# Patient Record
Sex: Female | Born: 1955 | Race: White | Hispanic: No | Marital: Married | Smoking: Current every day smoker
Health system: Southern US, Community
[De-identification: ages and names within clinical notes are randomized; demographics above are authoritative.]

## PROBLEM LIST (undated history)

## (undated) DIAGNOSIS — IMO0002 Reserved for concepts with insufficient information to code with codable children: Secondary | ICD-10-CM

## (undated) DIAGNOSIS — K589 Irritable bowel syndrome without diarrhea: Secondary | ICD-10-CM

## (undated) DIAGNOSIS — J329 Chronic sinusitis, unspecified: Secondary | ICD-10-CM

## (undated) DIAGNOSIS — T7840XA Allergy, unspecified, initial encounter: Secondary | ICD-10-CM

## (undated) DIAGNOSIS — F329 Major depressive disorder, single episode, unspecified: Secondary | ICD-10-CM

## (undated) DIAGNOSIS — K219 Gastro-esophageal reflux disease without esophagitis: Secondary | ICD-10-CM

## (undated) DIAGNOSIS — R601 Generalized edema: Secondary | ICD-10-CM

## (undated) DIAGNOSIS — F32A Depression, unspecified: Secondary | ICD-10-CM

## (undated) DIAGNOSIS — I10 Essential (primary) hypertension: Secondary | ICD-10-CM

## (undated) DIAGNOSIS — F419 Anxiety disorder, unspecified: Secondary | ICD-10-CM

## (undated) DIAGNOSIS — M549 Dorsalgia, unspecified: Secondary | ICD-10-CM

## (undated) DIAGNOSIS — G8929 Other chronic pain: Secondary | ICD-10-CM

## (undated) DIAGNOSIS — G4700 Insomnia, unspecified: Secondary | ICD-10-CM

## (undated) HISTORY — DX: Irritable bowel syndrome, unspecified: K58.9

## (undated) HISTORY — PX: EYE SURGERY: SHX253

## (undated) HISTORY — DX: Anxiety disorder, unspecified: F41.9

## (undated) HISTORY — PX: FRACTURE SURGERY: SHX138

## (undated) HISTORY — PX: COLONOSCOPY WITH ESOPHAGOGASTRODUODENOSCOPY (EGD): SHX5779

## (undated) HISTORY — PX: ABDOMINAL HYSTERECTOMY: SHX81

## (undated) HISTORY — DX: Gastro-esophageal reflux disease without esophagitis: K21.9

## (undated) HISTORY — DX: Chronic sinusitis, unspecified: J32.9

## (undated) HISTORY — DX: Major depressive disorder, single episode, unspecified: F32.9

## (undated) HISTORY — DX: Insomnia, unspecified: G47.00

## (undated) HISTORY — DX: Other chronic pain: G89.29

## (undated) HISTORY — DX: Dorsalgia, unspecified: M54.9

## (undated) HISTORY — DX: Generalized edema: R60.1

## (undated) HISTORY — PX: APPENDECTOMY: SHX54

## (undated) HISTORY — DX: Depression, unspecified: F32.A

## (undated) HISTORY — PX: DILATION AND CURETTAGE OF UTERUS: SHX78

## (undated) HISTORY — DX: Essential (primary) hypertension: I10

## (undated) HISTORY — PX: BREAST BIOPSY: SHX20

## (undated) HISTORY — DX: Allergy, unspecified, initial encounter: T78.40XA

## (undated) HISTORY — DX: Reserved for concepts with insufficient information to code with codable children: IMO0002

---

## 2004-10-07 ENCOUNTER — Ambulatory Visit: Payer: Self-pay

## 2004-10-19 ENCOUNTER — Ambulatory Visit: Payer: Self-pay

## 2004-12-02 ENCOUNTER — Ambulatory Visit: Payer: Self-pay | Admitting: Nurse Practitioner

## 2005-04-14 ENCOUNTER — Ambulatory Visit: Payer: Self-pay

## 2006-04-13 ENCOUNTER — Ambulatory Visit: Payer: Self-pay

## 2009-03-12 ENCOUNTER — Ambulatory Visit: Payer: Self-pay

## 2009-03-24 ENCOUNTER — Ambulatory Visit: Payer: Self-pay

## 2009-10-22 ENCOUNTER — Ambulatory Visit: Payer: Self-pay

## 2011-09-22 ENCOUNTER — Ambulatory Visit: Payer: Self-pay | Admitting: Family Medicine

## 2013-12-18 ENCOUNTER — Ambulatory Visit: Payer: Self-pay | Admitting: Pain Medicine

## 2013-12-24 ENCOUNTER — Ambulatory Visit: Payer: Self-pay | Admitting: Pain Medicine

## 2014-01-07 ENCOUNTER — Ambulatory Visit: Payer: Self-pay | Admitting: Pain Medicine

## 2014-01-08 ENCOUNTER — Ambulatory Visit: Payer: Self-pay | Admitting: Pain Medicine

## 2014-01-09 ENCOUNTER — Ambulatory Visit: Payer: Self-pay | Admitting: Pain Medicine

## 2014-02-05 ENCOUNTER — Ambulatory Visit: Payer: Self-pay | Admitting: Pain Medicine

## 2014-02-27 ENCOUNTER — Ambulatory Visit: Payer: Self-pay | Admitting: Pain Medicine

## 2014-04-02 ENCOUNTER — Ambulatory Visit: Admit: 2014-04-02 | Disposition: A | Discharge status: Home / Self Care | Payer: Self-pay | Admitting: Family Medicine

## 2014-04-02 ENCOUNTER — Ambulatory Visit: Payer: Self-pay | Admitting: Pain Medicine

## 2014-05-20 ENCOUNTER — Ambulatory Visit: Payer: Self-pay | Admitting: Pain Medicine

## 2014-06-18 ENCOUNTER — Ambulatory Visit: Payer: Self-pay | Admitting: Pain Medicine

## 2014-07-22 ENCOUNTER — Ambulatory Visit: Payer: Self-pay | Admitting: Pain Medicine

## 2014-08-28 ENCOUNTER — Ambulatory Visit
Admit: 2014-08-28 | Disposition: A | Discharge status: Home / Self Care | Payer: Self-pay | Attending: Pain Medicine | Admitting: Pain Medicine

## 2014-09-11 ENCOUNTER — Ambulatory Visit
Admit: 2014-09-11 | Disposition: A | Discharge status: Home / Self Care | Payer: Self-pay | Attending: Pain Medicine | Admitting: Pain Medicine

## 2014-10-15 ENCOUNTER — Encounter: Payer: Self-pay | Admitting: Pain Medicine

## 2014-10-15 ENCOUNTER — Ambulatory Visit: Payer: BC Managed Care – PPO | Attending: Pain Medicine | Admitting: Pain Medicine

## 2014-10-15 VITALS — BP 126/82 | HR 100 | Temp 98.1°F | Resp 18 | Ht 66.0 in | Wt 180.0 lb

## 2014-10-15 DIAGNOSIS — M533 Sacrococcygeal disorders, not elsewhere classified: Secondary | ICD-10-CM

## 2014-10-15 DIAGNOSIS — M461 Sacroiliitis, not elsewhere classified: Secondary | ICD-10-CM | POA: Diagnosis not present

## 2014-10-15 DIAGNOSIS — M5136 Other intervertebral disc degeneration, lumbar region: Secondary | ICD-10-CM | POA: Diagnosis not present

## 2014-10-15 DIAGNOSIS — M1288 Other specific arthropathies, not elsewhere classified, other specified site: Secondary | ICD-10-CM | POA: Insufficient documentation

## 2014-10-15 DIAGNOSIS — M5126 Other intervertebral disc displacement, lumbar region: Secondary | ICD-10-CM | POA: Diagnosis not present

## 2014-10-15 DIAGNOSIS — M47816 Spondylosis without myelopathy or radiculopathy, lumbar region: Secondary | ICD-10-CM

## 2014-10-15 DIAGNOSIS — M545 Low back pain: Secondary | ICD-10-CM | POA: Diagnosis present

## 2014-10-15 MED ORDER — HYDROCODONE-ACETAMINOPHEN 5-325 MG PO TABS: ORAL_TABLET | ORAL | Status: DC

## 2014-10-15 MED ORDER — CYCLOBENZAPRINE HCL 10 MG PO TABS: ORAL_TABLET | ORAL | Status: DC

## 2014-10-15 NOTE — Progress Notes (Signed)
   Subjective:    Patient ID: Katherine Lester, female    DOB: 1956-04-16, 59 y.o.   MRN: [id number]  HPI  Patient is a 59 year old female returns to [place] for further evaluation and treatment of pain involving the lumbar lower extremity region. Patient has had significant improvement with prior lumbar facet, medial branch nerve blocks and wishes to proceed with lumbar facet medial branch nerve blocks at time return appointment. He denies trauma change in events of daily living the call significant change in symptomatology. We will proceed with procedure in attempt to decrease severity of symptoms minimize progression of patient's symptoms and hopefully avoid the need for more involved treatment. We will continue medications as prescribed  Review of Systems     Objective:   Physical Exam  There was tenderness over the splenius capitis muscles and the occipitalis muscles of mild degree with mild tenderness of the acromioclavicular and glenohumeral joint regions. This of the thoracic and cervical paraspinal musculature regions. Tinel and Phalen's maneuver without increased pain of significant degree. Patient of the lumbar facet lumbar paraspinal musculature region musculature region reproduced severe pain on the left compared to the right. With severe tenderness to palpation of the PSIS and PII S region as well outpatient of the lumbar facets on the left reproduce most severe component of patient's pain. Leg raising tolerates approximately 30 without an increase of pain with dorsiflexion noted. DTRs appear to be 1+ at the knees. Tenderness of the greater trochanteric region iliotibial band region. Negative clonus negative Homans abdomen nontender no costovertebral angle tenderness noted.      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  Facet syndrome  Sacroiliitis, sacroiliac joint dysfunction    Plan  Continue present medications. Flexeril and hydrocodone acetaminophen  Will perform lumbar facet, medial branch nerve, blocks at time return appointment  F/U PCP for evaliation of  BP and general medical  condition.  F/U surgical evaluation.  F/U neurological evaluation.  May consider radiofrequency rhizolysis or intraspinal procedures pending response to present treatment and F/U evaluation.  Patient to call [facility] should patient have concerns prior to scheduled return appointment.

## 2014-10-15 NOTE — Progress Notes (Signed)
   Subjective:    Patient ID: Katherine Lester, female    DOB: Nov 04, 1955, 59 y.o.   MRN: [id number]  HPI    Review of Systems     Objective:   Physical Exam        Assessment & Plan:

## 2014-10-15 NOTE — Patient Instructions (Addendum)
Continue present medications. Flexeril and hydrocodone acetaminophen  Lumbar facet, medial branch nerve blocks on 11/11/2014  F/U PCP for evaliation of  BP and general medical  condition.  F/U surgical evaluation.  F/U neurological evaluation.  May consider radiofrequency rhizolysis or intraspinal procedures pending response to present treatment and F/U evaluation.  Patient to call Pain Management Center should patient have concerns prior to scheduled return appointment. Facet Joint Block The facet joints connect the bones of the spine (vertebrae). They make it possible for you to bend, twist, and make other movements with your spine. They also prevent you from overbending, overtwisting, and making other excessive movements.  A facet joint block is a procedure where a numbing medicine (anesthetic) is injected into a facet joint. Often, a type of anti-inflammatory medicine called a steroid is also injected. A facet joint block may be done for two reasons:  1. Diagnosis. A facet joint block may be done as a test to see whether neck or back pain is caused by a worn-down or infected facet joint. If the pain gets better after a facet joint block, it means the pain is probably coming from the facet joint. If the pain does not get better, it means the pain is probably not coming from the facet joint.  2. Therapy. A facet joint block may be done to relieve neck or back pain caused by a facet joint. A facet joint block is only done as a therapy if the pain does not improve with medicine, exercise programs, physical therapy, and other forms of pain management. LET [facility] CARE PROVIDER KNOW ABOUT:   Any allergies you have.   All medicines you are taking, including vitamins, herbs, eyedrops, and over-the-counter medicines and creams.   Previous problems you or members of your family have had with the use of anesthetics.   Any blood disorders you have had.   Other health problems you  have. RISKS AND COMPLICATIONS Generally, having a facet joint block is safe. However, as with any procedure, complications can occur. Possible complications associated with having a facet joint block include:   Bleeding.   Injury to a nerve near the injection site.   Pain at the injection site.   Weakness or numbness in areas controlled by nerves near the injection site.   Infection.   Temporary fluid retention.   Allergic reaction to anesthetics or medicines used during the procedure. BEFORE THE PROCEDURE   Follow your health care provider's instructions if you are taking dietary supplements or medicines. You may need to stop taking them or reduce your dosage.   Do not take any new dietary supplements or medicines without asking your health care provider first.   Follow your health care provider's instructions about eating and drinking before the procedure. You may need to stop eating and drinking several hours before the procedure.   Arrange to have an adult drive you home after the procedure. PROCEDURE 1. You may need to remove your clothing and dress in an open-back gown so that your health care provider can access your spine.  2. The procedure will be done while you are lying on an X-ray table. Most of the time you will be asked to lie on your stomach, but you may be asked to lie in a different position if an injection will be made in your neck.  3. Special machines will be used to monitor your oxygen levels, heart rate, and blood pressure.  4. If an injection will be  made in your neck, an intravenous (IV) tube will be inserted into one of your veins. Fluids and medicine will flow directly into your body through the IV tube.  5. The area over the facet joint where the injection will be made will be cleaned with an antiseptic soap. The surrounding skin will be covered with sterile drapes.  6. An anesthetic will be applied to your skin to make the injection area numb.  You may feel a temporary stinging or burning sensation.  7. A video X-ray machine will be used to locate the joint. A contrast dye may be injected into the facet joint area to help with locating the joint.  8. When the joint is located, an anesthetic medicine will be injected into the joint through the needle.  9. Your health care provider will ask you whether you feel pain relief. If you do feel relief, a steroid may be injected to provide pain relief for a longer period of time. If you do not feel relief or feel only partial relief, additional injections of an anesthetic may be made in other facet joints.  10. The needle will be removed, the skin will be cleansed, and bandages will be applied.  AFTER THE PROCEDURE   You will be observed for 15-30 minutes before being allowed to go home. Do not drive. Have an adult drive you or take a taxi or public transportation instead.   If you feel pain relief, the pain will return in several hours or days when the anesthetic wears off.   You may feel pain relief 2-14 days after the procedure. The amount of time this relief lasts varies from person to person.   It is normal to feel some tenderness over the injected area(s) for 2 days following the procedure.   If you have diabetes, you may have a temporary increase in blood sugar. Document Released: 10/06/2006 Document Revised: 10/01/2013 Document Reviewed: 03/06/2012 [facility] Patient Information 2015 [place], [place]. This information is not intended to replace advice given to you by your health care provider. Make sure you discuss any questions you have with your health care provider. Pain Management Discharge Instructions  General Discharge Instructions :  If you need to reach your doctor call: Monday-Friday 8:00 am - 4:00 pm at [phone] or toll free [phone].  After clinic hours [phone] to have operator reach doctor.  Bring all of your medication bottles to all your  appointments in the pain clinic.  To cancel or reschedule your appointment with Pain Management please remember to call 24 hours in advance to avoid a fee.  Refer to the educational materials which you have been given on: General Risks, I had my Procedure. Discharge Instructions, Post Sedation.  Post Procedure Instructions:  The drugs you were given will stay in your system until tomorrow, so for the next 24 hours you should not drive, make any legal decisions or drink any alcoholic beverages.  You may eat anything you prefer, but it is better to start with liquids then soups and crackers, and gradually work up to solid foods.  Please notify your doctor immediately if you have any unusual bleeding, trouble breathing or pain that is not related to your normal pain.  Depending on the type of procedure that was done, some parts of your body may feel week and/or numb.  This usually clears up by tonight or the next day.  Walk with the use of an assistive device or accompanied by an adult for the 24  hours.  You may use ice on the affected area for the first 24 hours.  Put ice in a Ziploc bag and cover with a towel and place against area 15 minutes on 15 minutes off.  You may switch to heat after 24 hours.GENERAL RISKS AND COMPLICATIONS  What are the risk, side effects and possible complications? Generally speaking, most procedures are safe.  However, with any procedure there are risks, side effects, and the possibility of complications.  The risks and complications are dependent upon the sites that are lesioned, or the type of nerve block to be performed.  The closer the procedure is to the spine, the more serious the risks are.  Great care is taken when placing the radio frequency needles, block needles or lesioning probes, but sometimes complications can occur. 1. Infection: Any time there is an injection through the skin, there is a risk of infection.  This is why sterile conditions are used for  these blocks.  There are four possible types of infection. 1. Localized skin infection. 2. Central Nervous System Infection-This can be in the form of Meningitis, which can be deadly. 3. Epidural Infections-This can be in the form of an epidural abscess, which can cause pressure inside of the spine, causing compression of the spinal cord with subsequent paralysis. This would require an emergency surgery to decompress, and there are no guarantees that the patient would recover from the paralysis. 4. Discitis-This is an infection of the intervertebral discs.  It occurs in about 1% of discography procedures.  It is difficult to treat and it may lead to surgery.        2. Pain: the needles have to go through skin and soft tissues, will cause soreness.       3. Damage to internal structures:  The nerves to be lesioned may be near blood vessels or    other nerves which can be potentially damaged.       4. Bleeding: Bleeding is more common if the patient is taking blood thinners such as  aspirin, Coumadin, Ticiid, Plavix, etc., or if he/she have some genetic predisposition  such as hemophilia. Bleeding into the spinal canal can cause compression of the spinal  cord with subsequent paralysis.  This would require an emergency surgery to  decompress and there are no guarantees that the patient would recover from the  paralysis.       5. Pneumothorax:  Puncturing of a lung is a possibility, every time a needle is introduced in  the area of the chest or upper back.  Pneumothorax refers to free air around the  collapsed lung(s), inside of the thoracic cavity (chest cavity).  Another two possible  complications related to a similar event would include: Hemothorax and Chylothorax.   These are variations of the Pneumothorax, where instead of air around the collapsed  lung(s), you may have blood or chyle, respectively.       6. Spinal headaches: They may occur with any procedures in the area of the spine.        7. Persistent CSF (Cerebro-Spinal Fluid) leakage: This is a rare problem, but may occur  with prolonged intrathecal or epidural catheters either due to the formation of a fistulous  track or a dural tear.       8. Nerve damage: By working so close to the spinal cord, there is always a possibility of  nerve damage, which could be as serious as a permanent spinal cord injury with  paralysis.       9. Death:  Although rare, severe deadly allergic reactions known as "Anaphylactic  reaction" can occur to any of the medications used.      10. Worsening of the symptoms:  We can always make thing worse.  What are the chances of something like this happening? Chances of any of this occuring are extremely low.  By statistics, you have more of a chance of getting killed in a motor vehicle accident: while driving to the hospital than any of the above occurring .  Nevertheless, you should be aware that they are possibilities.  In general, it is similar to taking a shower.  Everybody knows that you can slip, hit your head and get killed.  Does that mean that you should not shower again?  Nevertheless always keep in mind that statistics do not mean anything if you happen to be on the wrong side of them.  Even if a procedure has a 1 (one) in a 1,000,000 (million) chance of going wrong, it you happen to be that one..Also, keep in mind that by statistics, you have more of a chance of having something go wrong when taking medications.  Who should not have this procedure? If you are on a blood thinning medication (e.g. Coumadin, Plavix, see list of "Blood Thinners"), or if you have an active infection going on, you should not have the procedure.  If you are taking any blood thinners, please inform your physician.  How should I prepare for this procedure?  Do not eat or drink anything at least six hours prior to the procedure.  Bring a driver with you .  It cannot be a taxi.  Come accompanied by an adult that can drive  you back, and that is strong enough to help you if your legs get weak or numb from the local anesthetic.  Take all of your medicines the morning of the procedure with just enough water to swallow them.  If you have diabetes, make sure that you are scheduled to have your procedure done first thing in the morning, whenever possible.  If you have diabetes, take only half of your insulin dose and notify our nurse that you have done so as soon as you arrive at the clinic.  If you are diabetic, but only take blood sugar pills (oral hypoglycemic), then do not take them on the morning of your procedure.  You may take them after you have had the procedure.  Do not take aspirin or any aspirin-containing medications, at least eleven (11) days prior to the procedure.  They may prolong bleeding.  Wear loose fitting clothing that may be easy to take off and that you would not mind if it got stained with Betadine or blood.  Do not wear any jewelry or perfume  Remove any nail coloring.  It will interfere with some of our monitoring equipment.  NOTE: Remember that this is not meant to be interpreted as a complete list of all possible complications.  Unforeseen problems may occur.  BLOOD THINNERS The following drugs contain aspirin or other products, which can cause increased bleeding during surgery and should not be taken for 2 weeks prior to and 1 week after surgery.  If you should need take something for relief of minor pain, you may take acetaminophen which is found in Tylenol,m Datril, Anacin-3 and Panadol. It is not blood thinner. The products listed below are.  Do not take any of the products listed below  in addition to any listed on your instruction sheet.  A.P.C or A.P.C with Codeine Codeine Phosphate Capsules #3 Ibuprofen Ridaura  ABC compound Congesprin Imuran rimadil  Advil Cope Indocin Robaxisal  Alka-Seltzer Effervescent Pain Reliever and Antacid Coricidin or Coricidin-D  Indomethacin Rufen   Alka-Seltzer plus Cold Medicine Cosprin Ketoprofen S-A-C Tablets  Anacin Analgesic Tablets or Capsules Coumadin Korlgesic Salflex  Anacin Extra Strength Analgesic tablets or capsules CP-2 Tablets Lanoril Salicylate  Anaprox Cuprimine Capsules Levenox Salocol  Anexsia-D Dalteparin Magan Salsalate  Anodynos Darvon compound Magnesium Salicylate Sine-off  Ansaid Dasin Capsules Magsal Sodium Salicylate  Anturane Depen Capsules Marnal Soma  APF Arthritis pain formula Dewitt's Pills Measurin Stanback  Argesic Dia-Gesic Meclofenamic Sulfinpyrazone  Arthritis Bayer Timed Release Aspirin Diclofenac Meclomen Sulindac  Arthritis pain formula Anacin Dicumarol Medipren Supac  Analgesic (Safety coated) Arthralgen Diffunasal Mefanamic Suprofen  Arthritis Strength Bufferin Dihydrocodeine Mepro Compound Suprol  Arthropan liquid Dopirydamole Methcarbomol with Aspirin Synalgos  ASA tablets/Enseals Disalcid Micrainin Tagament  Ascriptin Doan's Midol Talwin  Ascriptin A/D Dolene Mobidin Tanderil  Ascriptin Extra Strength Dolobid Moblgesic Ticlid  Ascriptin with Codeine Doloprin or Doloprin with Codeine Momentum Tolectin  Asperbuf Duoprin Mono-gesic Trendar  Aspergum Duradyne Motrin or Motrin IB Triminicin  Aspirin plain, buffered or enteric coated Durasal Myochrisine Trigesic  Aspirin Suppositories Easprin Nalfon Trillsate  Aspirin with Codeine Ecotrin Regular or Extra Strength Naprosyn Uracel  Atromid-S Efficin Naproxen Ursinus  Auranofin Capsules Elmiron Neocylate Vanquish  Axotal Emagrin Norgesic Verin  Azathioprine Empirin or Empirin with Codeine Normiflo Vitamin E  Azolid Emprazil Nuprin Voltaren  Bayer Aspirin plain, buffered or children's or timed BC Tablets or powders Encaprin Orgaran Warfarin Sodium  Buff-a-Comp Enoxaparin Orudis Zorpin  Buff-a-Comp with Codeine Equegesic Os-Cal-Gesic   Buffaprin Excedrin plain, buffered or Extra Strength Oxalid   Bufferin Arthritis Strength Feldene  Oxphenbutazone   Bufferin plain or Extra Strength Feldene Capsules Oxycodone with Aspirin   Bufferin with Codeine Fenoprofen Fenoprofen Pabalate or Pabalate-SF   Buffets II Flogesic Panagesic   Buffinol plain or Extra Strength Florinal or Florinal with Codeine Panwarfarin   Buf-Tabs Flurbiprofen Penicillamine   Butalbital Compound Four-way cold tablets Penicillin   Butazolidin Fragmin Pepto-Bismol   Carbenicillin Geminisyn Percodan   Carna Arthritis Reliever Geopen Persantine   Carprofen Gold's salt Persistin   Chloramphenicol Goody's Phenylbutazone   Chloromycetin Haltrain Piroxlcam   Clmetidine heparin Plaquenil   Cllnoril Hyco-pap Ponstel   Clofibrate Hydroxy chloroquine Propoxyphen         Before stopping any of these medications, be sure to consult the physician who ordered them.  Some, such as Coumadin (Warfarin) are ordered to prevent or treat serious conditions such as "deep thrombosis", "pumonary embolisms", and other heart problems.  The amount of time that you may need off of the medication may also vary with the medication and the reason for which you were taking it.  If you are taking any of these medications, please make sure you notify your pain physician before you undergo any procedures.

## 2014-10-15 NOTE — Progress Notes (Signed)
1312 Discharged ambulatory. Scripts given as ordered. teachback 3 done.-----[employer]

## 2014-10-21 ENCOUNTER — Telehealth: Payer: Self-pay | Admitting: Pain Medicine

## 2014-10-21 NOTE — Telephone Encounter (Signed)
Pt hurting bad , has procedure sch for 6/13 would like to get moved up to ASAP. Please let me know when we can get this pt sch

## 2014-10-22 ENCOUNTER — Other Ambulatory Visit: Payer: Self-pay | Admitting: Pain Medicine

## 2014-10-22 NOTE — Telephone Encounter (Signed)
Spoke with Dr. Primus Bravo, patient may come 6/6 or 6/8. Juliann Pulse notified patient.

## 2014-11-04 ENCOUNTER — Ambulatory Visit: Payer: BC Managed Care – PPO | Admitting: Pain Medicine

## 2014-11-11 ENCOUNTER — Ambulatory Visit: Payer: BC Managed Care – PPO | Admitting: Pain Medicine

## 2014-11-13 ENCOUNTER — Ambulatory Visit: Payer: BC Managed Care – PPO | Attending: Pain Medicine | Admitting: Pain Medicine

## 2014-11-13 ENCOUNTER — Encounter: Payer: Self-pay | Admitting: Pain Medicine

## 2014-11-13 VITALS — BP 119/73 | HR 84 | Temp 98.2°F | Resp 16 | Ht 66.0 in | Wt 180.0 lb

## 2014-11-13 DIAGNOSIS — M47816 Spondylosis without myelopathy or radiculopathy, lumbar region: Secondary | ICD-10-CM | POA: Diagnosis not present

## 2014-11-13 DIAGNOSIS — M545 Low back pain: Secondary | ICD-10-CM | POA: Diagnosis present

## 2014-11-13 DIAGNOSIS — M533 Sacrococcygeal disorders, not elsewhere classified: Secondary | ICD-10-CM | POA: Insufficient documentation

## 2014-11-13 DIAGNOSIS — M5126 Other intervertebral disc displacement, lumbar region: Secondary | ICD-10-CM | POA: Diagnosis not present

## 2014-11-13 DIAGNOSIS — M79604 Pain in right leg: Secondary | ICD-10-CM | POA: Diagnosis present

## 2014-11-13 DIAGNOSIS — M5136 Other intervertebral disc degeneration, lumbar region: Secondary | ICD-10-CM | POA: Insufficient documentation

## 2014-11-13 DIAGNOSIS — M79605 Pain in left leg: Secondary | ICD-10-CM | POA: Diagnosis present

## 2014-11-13 MED ORDER — CYCLOBENZAPRINE HCL 10 MG PO TABS: ORAL_TABLET | ORAL | Status: DC

## 2014-11-13 MED ORDER — TRIAMCINOLONE ACETONIDE 40 MG/ML IJ SUSP
INTRAMUSCULAR | Status: AC
  Administered 2014-11-13: 40 mg
  Filled 2014-11-13: qty 1

## 2014-11-13 MED ORDER — BUPIVACAINE HCL (PF) 0.25 % IJ SOLN
INTRAMUSCULAR | Status: AC
  Administered 2014-11-13: 10:00:00
  Filled 2014-11-13: qty 30

## 2014-11-13 MED ORDER — FENTANYL CITRATE (PF) 100 MCG/2ML IJ SOLN
INTRAMUSCULAR | Status: AC
  Filled 2014-11-13: qty 2

## 2014-11-13 MED ORDER — MIDAZOLAM HCL 5 MG/5ML IJ SOLN
INTRAMUSCULAR | Status: AC
  Administered 2014-11-13: 5 mg via INTRAVENOUS
  Filled 2014-11-13: qty 5

## 2014-11-13 MED ORDER — HYDROCODONE-ACETAMINOPHEN 5-325 MG PO TABS: ORAL_TABLET | ORAL | Status: DC

## 2014-11-13 MED ORDER — ORPHENADRINE CITRATE 30 MG/ML IJ SOLN
INTRAMUSCULAR | Status: AC
  Administered 2014-11-13: 60 mg
  Filled 2014-11-13: qty 2

## 2014-11-13 NOTE — Progress Notes (Signed)
Subjective:    Patient ID: Katherine Lester, female    DOB: 1956/05/12, 59 y.o.   MRN: [id number]  HPI  PROCEDURE PERFORMED: Lumbar facet (medial branch block)   NOTE: The patient is a 59 y.o. female who returns to [place] for further evaluation and treatment of pain involving the lumbar and lower extremity region. 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 The risks, benefits, and expectations of the procedure have been discussed and explained to the patient who was understanding and in agreement with suggested treatment plan. We will proceed with interventional treatment as discussed and as explained to the patient who was understanding and wished to proceed with procedure as planned.   DESCRIPTION OF PROCEDURE: Lumbar facet (medial branch block) with IV Versed, IV fentanyl conscious sedation, EKG, blood pressure, pulse, and pulse oximetry monitoring. The procedure was performed with the patient in the prone position. Betadine prep of proposed entry site performed.   NEEDLE PLACEMENT AT: Left L 3 lumbar facet (medial branch block). Under fluoroscopic guidance with oblique orientation of 15 degrees, a 22-gauge needle was inserted at the L 3 vertebral body level with needle placed at the targeted area of Burton's Eye or Eye of the Scotty Dog with documentation of needle placement in the superior and lateral border of targeted area of Burton's Eye or Eye of the Scotty Dog with oblique orientation of 15 degrees. Following documentation of needle placement at the L 3 vertebral body level, needle placement was then accomplished at the L 4 vertebral body level.  Repeat this technique on the left side at L4 and L5 exactly as was performed at the L3 vertebral body levels on the left and utilizing the same  technique under fluoroscopic guidance  NEEDLE PLACEMENT AT THE SACRAL ALA with AP view of the lumbosacral spine. With the patient in the prone position, Betadine prep of proposed entry site accomplished, a 22 gauge needle was inserted in the region of the sacral ala (groove formed by the superior articulating process of S1 and the sacral wing). Following documentation of needle placement at the sacral ala,  needle placement was then accomplished at the S1 foramen level.   NEEDLE PLACEMENT AT THE S1 FORAMEN LEVEL under fluoroscopic guidance with AP view of the lumbosacral spine and cephalad orientation of the fluoroscope, a 22-gauge needle was placed at the superior and lateral border of the S1 foramen under fluoroscopic guidance. Following documentation of needle placement at the S1 foramen.   Needle placement was then verified at all levels on lateral view. Following documentation of needle placement at all levels on lateral view and following negative aspiration for heme and CSF, each level was injected with 1 mL of 0.25% bupivacaine with Kenalog.     LUMBAR FACET, MEDIAL BRANCH NERVE, BLOCKS PERFORMED ON THE RIGHT SIDE   The procedure was performed on the right side exactly as was performed on the left side at the same levels and utilizing the same technique under fluoroscopic guidance.     The patient tolerated the procedure well. A total of 40 mg of Kenalog was utilized for the procedure.   PLAN:  1. Medications: The patient will continue presently prescribed medications cyclobenzaprine and hydrocodone acetaminophen. 2. May consider modification of treatment regimen at time of return appointment  pending response to treatment rendered on today's visit. 3. The patient is to follow-up with primary care physician for further evaluation of blood pressure and general medical condition status post steroid injection performed on today's visit. 4. Surgical follow-up evaluation. 5. Neurological  follow-up evaluation. 6. The patient may be candidate for radiofrequency procedures, implantation type procedures, and other treatment pending response to treatment and follow-up evaluation. 7. The patient has been advised to call the [facility] prior to scheduled return appointment should there be significant change in condition or should patient have other concerns regarding condition prior to scheduled return appointment.  The patient is understanding and in agreement with suggested treatment plan.   Review of Systems     Objective:   Physical Exam        Assessment & Plan:

## 2014-11-13 NOTE — Progress Notes (Signed)
Safety precautions to be maintained throughout the outpatient stay will include: orient to surroundings, keep bed in low position, maintain call bell within reach at all times, provide assistance with transfer out of bed and ambulation.  Tolerated po fluids well.  

## 2014-11-13 NOTE — Progress Notes (Signed)
   Subjective:    Patient ID: Katherine Lester, female    DOB: 1955-12-30, 59 y.o.   MRN: [id number]  HPI    Review of Systems     Objective:   Physical Exam        Assessment & Plan:

## 2014-11-13 NOTE — Patient Instructions (Addendum)
Continue present medications.  F/U PCP for evaliation of  BP and general medical  condition.  F/U surgical evaluation.  F/U neurological evaluation.  May consider radiofrequency rhizolysis or intraspinal procedures pending response to present treatment and F/U evaluation.  Patient to call [facility] Management Center should patient have concerns prior to scheduled return appointment.   Pain Management Discharge Instructions  General Discharge Instructions :  If you need to reach your doctor call: Monday-Friday 8:00 am - 4:00 pm at [phone] or toll free [phone].  After clinic hours [phone] to have operator reach doctor.  Bring all of your medication bottles to all your appointments in the pain clinic.  To cancel or reschedule your appointment with Pain Management please remember to call 24 hours in advance to avoid a fee.  Refer to the educational materials which you have been given on: General Risks, I had my Procedure. Discharge Instructions, Post Sedation.  Post Procedure Instructions:  The drugs you were given will stay in your system until tomorrow, so for the next 24 hours you should not drive, make any legal decisions or drink any alcoholic beverages.  You may eat anything you prefer, but it is better to start with liquids then soups and crackers, and gradually work up to solid foods.  Please notify your doctor immediately if you have any unusual bleeding, trouble breathing or pain that is not related to your normal pain.  Depending on the type of procedure that was done, some parts of your body may feel week and/or numb.  This usually clears up by tonight or the next day.  Walk with the use of an assistive device or accompanied by an adult for the 24 hours.  You may use ice on the affected area for the first 24 hours.  Put ice in a Ziploc bag and cover with a towel and place against area 15 minutes on 15 minutes off.  You may switch to heat after 24 hours.  A  prescription for HYDROCODONE was given to you today.  A prescription for FLEXERIL was sent to your pharmacy and should be available for pickup today.

## 2014-11-14 ENCOUNTER — Telehealth: Payer: Self-pay | Admitting: *Deleted

## 2014-11-14 NOTE — Telephone Encounter (Signed)
Patient denies any complications post procedure 

## 2014-12-12 ENCOUNTER — Encounter: Payer: Self-pay | Admitting: Pain Medicine

## 2014-12-12 ENCOUNTER — Ambulatory Visit: Payer: BC Managed Care – PPO | Attending: Pain Medicine | Admitting: Pain Medicine

## 2014-12-12 VITALS — BP 113/82 | HR 108 | Temp 97.6°F | Resp 16 | Ht 65.0 in | Wt 180.0 lb

## 2014-12-12 DIAGNOSIS — M5136 Other intervertebral disc degeneration, lumbar region: Secondary | ICD-10-CM

## 2014-12-12 DIAGNOSIS — M79604 Pain in right leg: Secondary | ICD-10-CM | POA: Diagnosis present

## 2014-12-12 DIAGNOSIS — M5126 Other intervertebral disc displacement, lumbar region: Secondary | ICD-10-CM | POA: Diagnosis not present

## 2014-12-12 DIAGNOSIS — M533 Sacrococcygeal disorders, not elsewhere classified: Secondary | ICD-10-CM | POA: Diagnosis not present

## 2014-12-12 DIAGNOSIS — M79605 Pain in left leg: Secondary | ICD-10-CM | POA: Diagnosis present

## 2014-12-12 DIAGNOSIS — M545 Low back pain: Secondary | ICD-10-CM | POA: Diagnosis present

## 2014-12-12 DIAGNOSIS — M47816 Spondylosis without myelopathy or radiculopathy, lumbar region: Secondary | ICD-10-CM

## 2014-12-12 MED ORDER — GABAPENTIN 100 MG PO CAPS: ORAL_CAPSULE | ORAL | Status: DC

## 2014-12-12 MED ORDER — CYCLOBENZAPRINE HCL 10 MG PO TABS: ORAL_TABLET | ORAL | Status: DC

## 2014-12-12 MED ORDER — HYDROCODONE-ACETAMINOPHEN 5-325 MG PO TABS: ORAL_TABLET | ORAL | Status: DC

## 2014-12-12 NOTE — Patient Instructions (Addendum)
Continue present medications. Flexeril and hydrocodone acetaminophen. Also begin taking Neurontin as prescribed at bedtime if tolerated. Neurontin can cause drowsiness confusion and other undesirable side effects and especially in combination with the other medications  F/U PCP Dr. Lennox Grumbles for evaliation of  BP and general medical  condition. Follow-up with Dr. Lennox Grumbles for evaluation of headache and general condition as discussed  F/U surgical evaluation  F/U neurological evaluation  May consider radiofrequency rhizolysis or intraspinal procedures pending response to present treatment and F/U evaluation.  Patient to call Pain Management Center should patient have concerns prior to scheduled return appointment.

## 2014-12-12 NOTE — Progress Notes (Signed)
Safety precautions to be maintained throughout the outpatient stay will include: orient to surroundings, keep bed in low position, maintain call bell within reach at all times, provide assistance with transfer out of bed and ambulation.  

## 2014-12-12 NOTE — Progress Notes (Signed)
   Subjective:    Patient ID: Katherine Lester, female    DOB: 03-21-1956, 59 y.o.   MRN: [id number]  HPI  patient is a 59 year old female returns to [place] for further evaluation and treatment of pain involving the region of the lower back and lower extremity region predominantly. Patient states that his pain is well controlled at this time. Patient states she is able to perform activities of daily living without significant pain interfere with any activities of daily living. We will continue present medications at this time and consider additional interventional treatment for modification of medication should there be change in condition. The patient is understanding and in agreement with suggested treatment plan      Review of Systems     Objective:   Physical Exam  there was mild tenderness of the splenius capitis and occipitalis musculature region. Mild tenderness of the region of the cervical facet cervical paraspinal musculature region and the thoracic facet thoracic paraspinal musculature region. There was unremarkable Spurling's maneuver and patient appeared to be with bilaterally equal grip strength. Tinel and Phalen's maneuver were without increase of pain of significant degree. Palpation of the thoracic facet thoracic paraspinal musculature region was without increased pain of significant degree. No crepitus of the thoracic region was noted. Palpation the lumbar paraspinal muscles lumbar facet region associated with mild to moderate discomfort. Lateral bending and rotation extension and palpation of the lumbar facets reproduce mild to moderate discomfort. Straight leg raising tolerated to 30 without increased pain with dorsiflexion noted. Negative clonus negative Homans. Mild tenderness greater trochanteric region iliotibial band region. Palpation of the PSIS and PII S region reproduced moderate discomfort. Abdomen nontender with no costovertebral angle tenderness  noted.       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 Please note we began Neurontin on today's visit and patient was cautioned regarding respiratory depression confusion and other undesirable side effects  F/U PCP Dr. Delight Stare for evaliation of  BP( and general medical  condition.  F/U surgical evaluation  F/U neurological evaluation  May consider radiofrequency rhizolysis or intraspinal procedures pending response to present treatment and F/U evaluation.  Patient to call [facility] Management Center should patient have concerns prior to scheduled return appointment.

## 2014-12-23 ENCOUNTER — Telehealth: Payer: Self-pay | Admitting: Pain Medicine

## 2014-12-23 ENCOUNTER — Other Ambulatory Visit: Payer: Self-pay | Admitting: Pain Medicine

## 2014-12-23 NOTE — Telephone Encounter (Signed)
The gabapentin making her not feel right , what should she do

## 2014-12-23 NOTE — Telephone Encounter (Signed)
Discontinue per Dr. Primus Bravo. Patient agrees and stopped last night.

## 2015-01-01 ENCOUNTER — Other Ambulatory Visit: Payer: Self-pay | Admitting: Pain Medicine

## 2015-01-01 DIAGNOSIS — M533 Sacrococcygeal disorders, not elsewhere classified: Secondary | ICD-10-CM

## 2015-01-01 DIAGNOSIS — M5136 Other intervertebral disc degeneration, lumbar region: Secondary | ICD-10-CM

## 2015-01-01 DIAGNOSIS — M47816 Spondylosis without myelopathy or radiculopathy, lumbar region: Secondary | ICD-10-CM

## 2015-01-14 ENCOUNTER — Ambulatory Visit: Payer: BC Managed Care – PPO | Attending: Pain Medicine | Admitting: Pain Medicine

## 2015-01-14 ENCOUNTER — Encounter: Payer: Self-pay | Admitting: Pain Medicine

## 2015-01-14 VITALS — BP 122/82 | HR 90 | Temp 98.4°F | Resp 16 | Ht 66.0 in | Wt 180.0 lb

## 2015-01-14 DIAGNOSIS — M5136 Other intervertebral disc degeneration, lumbar region: Secondary | ICD-10-CM | POA: Diagnosis not present

## 2015-01-14 DIAGNOSIS — M47816 Spondylosis without myelopathy or radiculopathy, lumbar region: Secondary | ICD-10-CM

## 2015-01-14 DIAGNOSIS — M5126 Other intervertebral disc displacement, lumbar region: Secondary | ICD-10-CM | POA: Diagnosis not present

## 2015-01-14 DIAGNOSIS — M533 Sacrococcygeal disorders, not elsewhere classified: Secondary | ICD-10-CM | POA: Diagnosis not present

## 2015-01-14 DIAGNOSIS — M79605 Pain in left leg: Secondary | ICD-10-CM | POA: Diagnosis present

## 2015-01-14 DIAGNOSIS — M545 Low back pain: Secondary | ICD-10-CM | POA: Diagnosis present

## 2015-01-14 DIAGNOSIS — M79604 Pain in right leg: Secondary | ICD-10-CM | POA: Diagnosis present

## 2015-01-14 MED ORDER — DULOXETINE HCL 20 MG PO CPEP: ORAL_CAPSULE | ORAL | Status: DC

## 2015-01-14 MED ORDER — CYCLOBENZAPRINE HCL 10 MG PO TABS: ORAL_TABLET | ORAL | Status: DC

## 2015-01-14 MED ORDER — HYDROCODONE-ACETAMINOPHEN 5-325 MG PO TABS: ORAL_TABLET | ORAL | Status: DC

## 2015-01-14 NOTE — Progress Notes (Signed)
Safety precautions to be maintained throughout the outpatient stay will include: orient to surroundings, keep bed in low position, maintain call bell within reach at all times, provide assistance with transfer out of bed and ambulation.  

## 2015-01-14 NOTE — Patient Instructions (Addendum)
Continue present medications Neurontin Flexeril and hydrocodone acetaminophen  F/U PCP Dr. Lennox Grumbles for evaliation of  BP and general medical  condition  F/U surgical evaluation  F/U neurological evaluation  May consider radiofrequency rhizolysis or intraspinal procedures pending response to present treatment and F/U evaluation   Patient to call Pain Management Center should patient have concerns prior to scheduled return appointmen. Facet Blocks Patient Information  Description: The facets are joints in the spine between the vertebrae.  Like any joints in the body, facets can become irritated and painful.  Arthritis can also effect the facets.  By injecting steroids and local anesthetic in and around these joints, we can temporarily block the nerve supply to them.  Steroids act directly on irritated nerves and tissues to reduce selling and inflammation which often leads to decreased pain.  Facet blocks may be done anywhere along the spine from the neck to the low back depending upon the location of your pain.   After numbing the skin with local anesthetic (like Novocaine), a small needle is passed onto the facet joints under x-ray guidance.  You may experience a sensation of pressure while this is being done.  The entire block usually lasts about 15-25 minutes.   Conditions which may be treated by facet blocks:   Low back/buttock pain  Neck/shoulder pain  Certain types of headaches  Preparation for the injection:  1. Do not eat any solid food or dairy products within 6 hours of your appointment. 2. You may drink clear liquid up to 2 hours before appointment.  Clear liquids include water, black coffee, juice or soda.  No milk or cream please. 3. You may take your regular medication, including pain medications, with a sip of water before your appointment.  Diabetics should hold regular insulin (if taken separately) and take 1/2 normal NPH dose the morning of the procedure.  Carry some sugar  containing items with you to your appointment. 4. A driver must accompany you and be prepared to drive you home after your procedure. 5. Bring all your current medications with you. 6. An IV may be inserted and sedation may be given at the discretion of the physician. 7. A blood pressure cuff, EKG and other monitors will often be applied during the procedure.  Some patients may need to have extra oxygen administered for a short period. 8. You will be asked to provide medical information, including your allergies and medications, prior to the procedure.  We must know immediately if you are taking blood thinners (like Coumadin/Warfarin) or if you are allergic to IV iodine contrast (dye).  We must know if you could possible be pregnant.  Possible side-effects:   Bleeding from needle site  Infection (rare, may require surgery)  Nerve injury (rare)  Numbness & tingling (temporary)  Difficulty urinating (rare, temporary)  Spinal headache (a headache worse with upright posture)  Light-headedness (temporary)  Pain at injection site (serveral days)  Decreased blood pressure (rare, temporary)  Weakness in arm/leg (temporary)  Pressure sensation in back/neck (temporary)   Call if you experience:   Fever/chills associated with headache or increased back/neck pain  Headache worsened by an upright position  New onset, weakness or numbness of an extremity below the injection site  Hives or difficulty breathing (go to the emergency room)  Inflammation or drainage at the injection site(s)  Severe back/neck pain greater than usual  New symptoms which are concerning to you  Please note:  Although the local anesthetic injected can often  make your back or neck feel good for several hours after the injection, the pain will likely return. It takes 3-7 days for steroids to work.  You may not notice any pain relief for at least one week.  If effective, we will often do a series of 2-3  injections spaced 3-6 weeks apart to maximally decrease your pain.  After the initial series, you may be a candidate for a more permanent nerve block of the facets.  If you have any questions, please call #[phone] [facility]

## 2015-01-14 NOTE — Progress Notes (Signed)
   Subjective:    Patient ID: [name], female    DOB: 07/25/1955, 59 y.o.   MRN: [id number]  HPI    The patient is 59 year old female who returns to [place] for further evaluation and treatment of pain involving the lower back lower extremity region. Patient states that she has had significant improvement with prior treatment performed in [facility]. At the present time patient is preparing to return to work and wishes to undergo interventional treatment in attempt to decrease the pain of the lower back lower extremity region. Patient was unable to tolerate Neurontin. We will replace the Neurontin with Cymbalta if patient is unable to continue the Neurontin. Patient may continue both Neurontin and Cymbalta if tolerated as she continues Flexeril and hydrocodone acetaminophen. We will proceed with lumbar facet, medial branch nerve, blocks at time return appointment in attempt to decrease severity of symptoms, minimize progression of symptoms, and avoid the need for more involved treatment. The patient was with understanding and in agreement status treatment plan.  Review of Systems     Objective:   Physical Exam   there was tends to palpation of the splenius capitis and occipitalis musculature region of mild degree. There was mild tenderness of the cervical facet cervical paraspinal musculature region. There was mild tends of the acromioclavicular glenohumeral joint region. Patient appeared to be with unremarkable Spurling's maneuver. Patient appeared to be bilaterally equal grip strength. Tinel and Phalen's maneuver were without increase of pain of significant degree. Palpation over the thoracic facet thoracic paraspinal muscular region was with minimal discomfort. Palpation over the lumbar paraspinal musculature region lumbar facet region was a tends to palpation of moderate degree. Lateral bending and rotation and extension and palpation of the lumbar facets  reproduce moderate discomfort to moderately severe discomfort on the right compared to the left. There was tenderness over the PSIS and PII S region right greater than the left. There was increased pain with Patrick's maneuver occurring on the right greater than the left. Straight leg raising was limited approximately 30  degrees and there was no increased pain with dorsiflexion noted. There was negative clonus negative Homans. 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 The patient was cautioned regarding respiratory depression confusion and other side effects which can occur with these medications. The patient expressed understanding the exercise all attempts to avoid undesirable side effects  Lumbar facet, medial branch nerve, blocks to be performed at time of return appointment as discussed  F/U PCP Dr. Lennox Grumbles for evaliation of  BP and general medical  condition  F/U surgical evaluation  F/U neurological evaluation  May consider radiofrequency rhizolysis or intraspinal procedures pending response to present treatment and F/U evaluation   Patient to call Pain Management Center should patient have concerns prior to scheduled return appointmen.

## 2015-01-20 ENCOUNTER — Encounter: Payer: Self-pay | Admitting: Pain Medicine

## 2015-01-20 ENCOUNTER — Ambulatory Visit: Payer: BC Managed Care – PPO | Attending: Pain Medicine | Admitting: Pain Medicine

## 2015-01-20 VITALS — BP 126/59 | HR 79 | Temp 98.0°F | Resp 16 | Ht 66.0 in | Wt 180.0 lb

## 2015-01-20 DIAGNOSIS — M545 Low back pain: Secondary | ICD-10-CM | POA: Diagnosis present

## 2015-01-20 DIAGNOSIS — M533 Sacrococcygeal disorders, not elsewhere classified: Secondary | ICD-10-CM

## 2015-01-20 DIAGNOSIS — M5126 Other intervertebral disc displacement, lumbar region: Secondary | ICD-10-CM | POA: Insufficient documentation

## 2015-01-20 DIAGNOSIS — M5136 Other intervertebral disc degeneration, lumbar region: Secondary | ICD-10-CM | POA: Diagnosis not present

## 2015-01-20 DIAGNOSIS — M79605 Pain in left leg: Secondary | ICD-10-CM | POA: Diagnosis present

## 2015-01-20 DIAGNOSIS — M79604 Pain in right leg: Secondary | ICD-10-CM | POA: Diagnosis present

## 2015-01-20 DIAGNOSIS — G9619 Other disorders of meninges, not elsewhere classified: Secondary | ICD-10-CM | POA: Insufficient documentation

## 2015-01-20 DIAGNOSIS — M47816 Spondylosis without myelopathy or radiculopathy, lumbar region: Secondary | ICD-10-CM

## 2015-01-20 MED ORDER — TRIAMCINOLONE ACETONIDE 40 MG/ML IJ SUSP
INTRAMUSCULAR | Status: AC
  Administered 2015-01-20: 40 mg
  Filled 2015-01-20: qty 1

## 2015-01-20 MED ORDER — BUPIVACAINE HCL (PF) 0.25 % IJ SOLN
INTRAMUSCULAR | Status: AC
  Administered 2015-01-20: 30 mL
  Filled 2015-01-20: qty 30

## 2015-01-20 MED ORDER — MIDAZOLAM HCL 5 MG/5ML IJ SOLN
INTRAMUSCULAR | Status: AC
  Administered 2015-01-20: 5 mg via INTRAVENOUS
  Filled 2015-01-20: qty 5

## 2015-01-20 MED ORDER — ORPHENADRINE CITRATE 30 MG/ML IJ SOLN
INTRAMUSCULAR | Status: AC
  Administered 2015-01-20: 60 mg
  Filled 2015-01-20: qty 2

## 2015-01-20 MED ORDER — CEFAZOLIN SODIUM 1 G IJ SOLR
INTRAMUSCULAR | Status: AC
  Filled 2015-01-20: qty 10

## 2015-01-20 MED ORDER — ROPIVACAINE HCL 2 MG/ML IJ SOLN
INTRAMUSCULAR | Status: AC
  Filled 2015-01-20: qty 20

## 2015-01-20 MED ORDER — FENTANYL CITRATE (PF) 100 MCG/2ML IJ SOLN
INTRAMUSCULAR | Status: AC
  Administered 2015-01-20: 50 ug via INTRAVENOUS
  Filled 2015-01-20: qty 2

## 2015-01-20 NOTE — Progress Notes (Signed)
Safety precautions to be maintained throughout the outpatient stay will include: orient to surroundings, keep bed in low position, maintain call bell within reach at all times, provide assistance with transfer out of bed and ambulation.  

## 2015-01-20 NOTE — Progress Notes (Signed)
md aware pt is allergic to fentanyl. Ordered iv fentanyl during procedure. Pt states " It makes me vomit and nauseated after the procedure."

## 2015-01-20 NOTE — Patient Instructions (Addendum)
Continue present medications Cymbalta Flexeril and hydrocodone acetaminophen   F/U PCP Dr. Lennox Grumbles for evaliation of  BP and general medical  condition  F/U surgical evaluation  F/U neurological evaluation  May consider radiofrequency rhizolysis or intraspinal procedures pending response to present treatment and F/U evaluation   Patient to call Pain Management Center should patient have concerns prior to scheduled return appointmen. Pain Management Discharge Instructions  General Discharge Instructions :  If you need to reach your doctor call: Monday-Friday 8:00 am - 4:00 pm at [phone] or toll free [phone].  After clinic hours [phone] to have operator reach doctor.  Bring all of your medication bottles to all your appointments in the pain clinic.  To cancel or reschedule your appointment with Pain Management please remember to call 24 hours in advance to avoid a fee.  Refer to the educational materials which you have been given on: General Risks, I had my Procedure. Discharge Instructions, Post Sedation.  Post Procedure Instructions:  The drugs you were given will stay in your system until tomorrow, so for the next 24 hours you should not drive, make any legal decisions or drink any alcoholic beverages.  You may eat anything you prefer, but it is better to start with liquids then soups and crackers, and gradually work up to solid foods.  Please notify your doctor immediately if you have any unusual bleeding, trouble breathing or pain that is not related to your normal pain.  Depending on the type of procedure that was done, some parts of your body may feel week and/or numb.  This usually clears up by tonight or the next day.  Walk with the use of an assistive device or accompanied by an adult for the 24 hours.  You may use ice on the affected area for the first 24 hours.  Put ice in a Ziploc bag and cover with a towel and place against area 15 minutes on 15 minutes  off.  You may switch to heat after 24 hours.GENERAL RISKS AND COMPLICATIONS  What are the risk, side effects and possible complications? Generally speaking, most procedures are safe.  However, with any procedure there are risks, side effects, and the possibility of complications.  The risks and complications are dependent upon the sites that are lesioned, or the type of nerve block to be performed.  The closer the procedure is to the spine, the more serious the risks are.  Great care is taken when placing the radio frequency needles, block needles or lesioning probes, but sometimes complications can occur. 1. Infection: Any time there is an injection through the skin, there is a risk of infection.  This is why sterile conditions are used for these blocks.  There are four possible types of infection. 1. Localized skin infection. 2. Central Nervous System Infection-This can be in the form of Meningitis, which can be deadly. 3. Epidural Infections-This can be in the form of an epidural abscess, which can cause pressure inside of the spine, causing compression of the spinal cord with subsequent paralysis. This would require an emergency surgery to decompress, and there are no guarantees that the patient would recover from the paralysis. 4. Discitis-This is an infection of the intervertebral discs.  It occurs in about 1% of discography procedures.  It is difficult to treat and it may lead to surgery.        2. Pain: the needles have to go through skin and soft tissues, will cause soreness.  3. Damage to internal structures:  The nerves to be lesioned may be near blood vessels or    other nerves which can be potentially damaged.       4. Bleeding: Bleeding is more common if the patient is taking blood thinners such as  aspirin, Coumadin, Ticiid, Plavix, etc., or if he/she have some genetic predisposition  such as hemophilia. Bleeding into the spinal canal can cause compression of the spinal  cord with  subsequent paralysis.  This would require an emergency surgery to  decompress and there are no guarantees that the patient would recover from the  paralysis.       5. Pneumothorax:  Puncturing of a lung is a possibility, every time a needle is introduced in  the area of the chest or upper back.  Pneumothorax refers to free air around the  collapsed lung(s), inside of the thoracic cavity (chest cavity).  Another two possible  complications related to a similar event would include: Hemothorax and Chylothorax.   These are variations of the Pneumothorax, where instead of air around the collapsed  lung(s), you may have blood or chyle, respectively.       6. Spinal headaches: They may occur with any procedures in the area of the spine.       7. Persistent CSF (Cerebro-Spinal Fluid) leakage: This is a rare problem, but may occur  with prolonged intrathecal or epidural catheters either due to the formation of a fistulous  track or a dural tear.       8. Nerve damage: By working so close to the spinal cord, there is always a possibility of  nerve damage, which could be as serious as a permanent spinal cord injury with  paralysis.       9. Death:  Although rare, severe deadly allergic reactions known as "Anaphylactic  reaction" can occur to any of the medications used.      10. Worsening of the symptoms:  We can always make thing worse.  What are the chances of something like this happening? Chances of any of this occuring are extremely low.  By statistics, you have more of a chance of getting killed in a motor vehicle accident: while driving to the hospital than any of the above occurring .  Nevertheless, you should be aware that they are possibilities.  In general, it is similar to taking a shower.  Everybody knows that you can slip, hit your head and get killed.  Does that mean that you should not shower again?  Nevertheless always keep in mind that statistics do not mean anything if you happen to be on the wrong  side of them.  Even if a procedure has a 1 (one) in a 1,000,000 (million) chance of going wrong, it you happen to be that one..Also, keep in mind that by statistics, you have more of a chance of having something go wrong when taking medications.  Who should not have this procedure? If you are on a blood thinning medication (e.g. Coumadin, Plavix, see list of "Blood Thinners"), or if you have an active infection going on, you should not have the procedure.  If you are taking any blood thinners, please inform your physician.  How should I prepare for this procedure?  Do not eat or drink anything at least six hours prior to the procedure.  Bring a driver with you .  It cannot be a taxi.  Come accompanied by an adult that can drive you back, and  that is strong enough to help you if your legs get weak or numb from the local anesthetic.  Take all of your medicines the morning of the procedure with just enough water to swallow them.  If you have diabetes, make sure that you are scheduled to have your procedure done first thing in the morning, whenever possible.  If you have diabetes, take only half of your insulin dose and notify our nurse that you have done so as soon as you arrive at the clinic.  If you are diabetic, but only take blood sugar pills (oral hypoglycemic), then do not take them on the morning of your procedure.  You may take them after you have had the procedure.  Do not take aspirin or any aspirin-containing medications, at least eleven (11) days prior to the procedure.  They may prolong bleeding.  Wear loose fitting clothing that may be easy to take off and that you would not mind if it got stained with Betadine or blood.  Do not wear any jewelry or perfume  Remove any nail coloring.  It will interfere with some of our monitoring equipment.  NOTE: Remember that this is not meant to be interpreted as a complete list of all possible complications.  Unforeseen problems may  occur.  BLOOD THINNERS The following drugs contain aspirin or other products, which can cause increased bleeding during surgery and should not be taken for 2 weeks prior to and 1 week after surgery.  If you should need take something for relief of minor pain, you may take acetaminophen which is found in Tylenol,m Datril, Anacin-3 and Panadol. It is not blood thinner. The products listed below are.  Do not take any of the products listed below in addition to any listed on your instruction sheet.  A.P.C or A.P.C with Codeine Codeine Phosphate Capsules #3 Ibuprofen Ridaura  ABC compound Congesprin Imuran rimadil  Advil Cope Indocin Robaxisal  Alka-Seltzer Effervescent Pain Reliever and Antacid Coricidin or Coricidin-D  Indomethacin Rufen  Alka-Seltzer plus Cold Medicine Cosprin Ketoprofen S-A-C Tablets  Anacin Analgesic Tablets or Capsules Coumadin Korlgesic Salflex  Anacin Extra Strength Analgesic tablets or capsules CP-2 Tablets Lanoril Salicylate  Anaprox Cuprimine Capsules Levenox Salocol  Anexsia-D Dalteparin Magan Salsalate  Anodynos Darvon compound Magnesium Salicylate Sine-off  Ansaid Dasin Capsules Magsal Sodium Salicylate  Anturane Depen Capsules Marnal Soma  APF Arthritis pain formula Dewitt's Pills Measurin Stanback  Argesic Dia-Gesic Meclofenamic Sulfinpyrazone  Arthritis Bayer Timed Release Aspirin Diclofenac Meclomen Sulindac  Arthritis pain formula Anacin Dicumarol Medipren Supac  Analgesic (Safety coated) Arthralgen Diffunasal Mefanamic Suprofen  Arthritis Strength Bufferin Dihydrocodeine Mepro Compound Suprol  Arthropan liquid Dopirydamole Methcarbomol with Aspirin Synalgos  ASA tablets/Enseals Disalcid Micrainin Tagament  Ascriptin Doan's Midol Talwin  Ascriptin A/D Dolene Mobidin Tanderil  Ascriptin Extra Strength Dolobid Moblgesic Ticlid  Ascriptin with Codeine Doloprin or Doloprin with Codeine Momentum Tolectin  Asperbuf Duoprin Mono-gesic Trendar  Aspergum Duradyne  Motrin or Motrin IB Triminicin  Aspirin plain, buffered or enteric coated Durasal Myochrisine Trigesic  Aspirin Suppositories Easprin Nalfon Trillsate  Aspirin with Codeine Ecotrin Regular or Extra Strength Naprosyn Uracel  Atromid-S Efficin Naproxen Ursinus  Auranofin Capsules Elmiron Neocylate Vanquish  Axotal Emagrin Norgesic Verin  Azathioprine Empirin or Empirin with Codeine Normiflo Vitamin E  Azolid Emprazil Nuprin Voltaren  Bayer Aspirin plain, buffered or children's or timed BC Tablets or powders Encaprin Orgaran Warfarin Sodium  Buff-a-Comp Enoxaparin Orudis Zorpin  Buff-a-Comp with Codeine Equegesic Os-Cal-Gesic   Buffaprin Excedrin plain, buffered  or Extra Strength Oxalid   Bufferin Arthritis Strength Feldene Oxphenbutazone   Bufferin plain or Extra Strength Feldene Capsules Oxycodone with Aspirin   Bufferin with Codeine Fenoprofen Fenoprofen Pabalate or Pabalate-SF   Buffets II Flogesic Panagesic   Buffinol plain or Extra Strength Florinal or Florinal with Codeine Panwarfarin   Buf-Tabs Flurbiprofen Penicillamine   Butalbital Compound Four-way cold tablets Penicillin   Butazolidin Fragmin Pepto-Bismol   Carbenicillin Geminisyn Percodan   Carna Arthritis Reliever Geopen Persantine   Carprofen Gold's salt Persistin   Chloramphenicol Goody's Phenylbutazone   Chloromycetin Haltrain Piroxlcam   Clmetidine heparin Plaquenil   Cllnoril Hyco-pap Ponstel   Clofibrate Hydroxy chloroquine Propoxyphen         Before stopping any of these medications, be sure to consult the physician who ordered them.  Some, such as Coumadin (Warfarin) are ordered to prevent or treat serious conditions such as "deep thrombosis", "pumonary embolisms", and other heart problems.  The amount of time that you may need off of the medication may also vary with the medication and the reason for which you were taking it.  If you are taking any of these medications, please make sure you notify your pain  physician before you undergo any procedures.

## 2015-01-20 NOTE — Progress Notes (Signed)
Subjective:    Patient ID: Katherine Lester, female    DOB: [birth year], 57 y.o.   MRN: [id number]  HPI  PROCEDURE PERFORMED: Lumbar facet (medial branch block)   NOTE: The patient is a 59 y.o. female who returns to [place] for further evaluation and treatment of pain involving the lumbar and lower extremity region.  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 The risks, benefits, and expectations of the procedure have been discussed and explained to the patient who was understanding and in agreement with suggested treatment plan. We will proceed with interventional treatment as discussed and as explained to the patient who was understanding and wished to proceed with procedure as planned.   DESCRIPTION OF PROCEDURE: Lumbar facet (medial branch block) with IV Versed, IV fentanyl conscious sedation, EKG, blood pressure, pulse, and pulse oximetry monitoring. The procedure was performed with the patient in the prone position. Betadine prep of proposed entry site performed.   NEEDLE PLACEMENT AT:  left L  3 lumbar facet (medial branch block). Under fluoroscopic guidance with oblique orientation of 15 degrees, a 22-gauge needle was inserted at the L  3 vertebral body level with needle placed at the targeted area of Burton's Eye or Eye of the Scotty Dog with documentation of needle placement in the superior and lateral border of targeted area of Burton's Eye or Eye of the Scotty Dog with oblique orientation of 15 degrees. Following documentation of needle placement at the L  3 vertebral body level, needle placement was then accomplished at the L  4 vertebral body level.   NEEDLE PLACEMENT AT  L4 and L5 VERTEBRAL BODY LEVELS ON THE LEFT SIDE The procedure was performed at the  L4 and L5 vertebral body  levels exactly as was performed at the L  3 vertebral body level utilizing the same technique and under fluoroscopic guidance.  NEEDLE PLACEMENT AT THE SACRAL ALA with AP view of the lumbosacral spine. With the patient in the prone position, Betadine prep of proposed entry site accomplished, a 22 gauge needle was inserted in the region of the sacral ala (groove formed by the superior articulating process of S1 and the sacral wing). Following documentation of needle placement at the sacral ala,  needle placement was then accomplished at the S1 foramen level.   NEEDLE PLACEMENT AT THE S1 FORAMEN LEVEL under fluoroscopic guidance with AP view of the lumbosacral spine and cephalad orientation of the fluoroscope, a 22-gauge needle was placed at the superior and lateral border of the S1 foramen under fluoroscopic guidance. Following documentation of needle placement at the S1 foramen.   Needle placement was then verified at all levels on lateral view. Following documentation of needle placement at all levels on lateral view and following negative aspiration for heme and CSF, each level was injected with 1 mL of 0.25% bupivacaine with Kenalog.     LUMBAR FACET, MEDIAL BRANCH NERVE, BLOCKS PERFORMED ON THE RIGHT SIDE   The procedure was performed on the right side exactly as was performed on the left side at the same levels and utilizing the same technique under fluoroscopic guidance.     The patient tolerated the procedure well. A total of 40 mg of Kenalog was utilized for the procedure.   PLAN:  1. Medications:  The patient will continue presently prescribed medications  Cymbalta Flexeril and  Hydrocodone acetaminophen 2. May consider modification of treatment regimen at time of return appointment pending response to treatment rendered on today's visit. 3. The patient is to follow-up with primary care physician  Dr. Lennox Grumbles for further evaluation of blood pressure and general medical condition status  post steroid injection performed on today's visit. 4. Surgical follow-up evaluation. 5. Neurological follow-up evaluation. 6. The patient may be candidate for radiofrequency procedures, implantation type procedures, and other treatment pending response to treatment and follow-up evaluation. 7. The patient has been advised to call the Pain Management Center prior to scheduled return appointment should there be significant change in condition or should patient have other concerns regarding condition prior to scheduled return appointment.  The patient is understanding and in agreement with suggested treatment plan.     Review of Systems     Objective:   Physical Exam        Assessment & Plan:

## 2015-01-21 ENCOUNTER — Telehealth: Payer: Self-pay | Admitting: *Deleted

## 2015-01-21 NOTE — Telephone Encounter (Signed)
Denies complications post procedure. 

## 2015-01-30 ENCOUNTER — Other Ambulatory Visit: Payer: Self-pay | Admitting: Pain Medicine

## 2015-02-12 ENCOUNTER — Ambulatory Visit: Payer: BC Managed Care – PPO | Attending: Pain Medicine | Admitting: Pain Medicine

## 2015-02-12 ENCOUNTER — Encounter: Payer: Self-pay | Admitting: Pain Medicine

## 2015-02-12 VITALS — BP 131/79 | HR 88 | Temp 97.8°F | Resp 17 | Ht 66.0 in | Wt 180.0 lb

## 2015-02-12 DIAGNOSIS — M545 Low back pain: Secondary | ICD-10-CM | POA: Diagnosis present

## 2015-02-12 DIAGNOSIS — M533 Sacrococcygeal disorders, not elsewhere classified: Secondary | ICD-10-CM | POA: Diagnosis not present

## 2015-02-12 DIAGNOSIS — M47816 Spondylosis without myelopathy or radiculopathy, lumbar region: Secondary | ICD-10-CM

## 2015-02-12 DIAGNOSIS — M5126 Other intervertebral disc displacement, lumbar region: Secondary | ICD-10-CM | POA: Insufficient documentation

## 2015-02-12 DIAGNOSIS — M79604 Pain in right leg: Secondary | ICD-10-CM | POA: Diagnosis present

## 2015-02-12 DIAGNOSIS — M5136 Other intervertebral disc degeneration, lumbar region: Secondary | ICD-10-CM | POA: Insufficient documentation

## 2015-02-12 DIAGNOSIS — M79605 Pain in left leg: Secondary | ICD-10-CM | POA: Diagnosis present

## 2015-02-12 MED ORDER — CYCLOBENZAPRINE HCL 10 MG PO TABS: ORAL_TABLET | ORAL | Status: DC

## 2015-02-12 MED ORDER — DULOXETINE HCL 20 MG PO CPEP: ORAL_CAPSULE | ORAL | Status: DC

## 2015-02-12 MED ORDER — HYDROCODONE-ACETAMINOPHEN 5-325 MG PO TABS: ORAL_TABLET | ORAL | Status: DC

## 2015-02-12 NOTE — Progress Notes (Signed)
   Subjective:    Patient ID: Katherine Lester, female    DOB: 1956/03/24, 59 y.o.   MRN: [id number]  HPI  Patient is 59 year old female returns to [place] for further evaluation and treatment of pain involving the lower back and lower extremity regions. Patient is a significant improvement of lower back and lower extremity pain with greater than 50% relief of pain following lumbar facet, medial branch nerve blocks, but patient wished to proceed with lumbar facet, medial branch nerve blocks at time return appointment as well as be considered for radiofrequency rhizolysis lumbar facet, medial branch nerves. We will schedule patient for lumbar facet blocks and will request insurance approval for radiofrequency procedures to be performed in the lumbar region as well. The patient was understanding and agreement status treatment plan. The patient will continue Flexeril and hydrocodone acetaminophen and Cymbalta as prescribed. Patient tolerating medications well     Review of Systems     Objective:   Physical Exam  There was mild tenderness of the splenius capitis occipitalis region. There was mild tenderness of the cervical facet cervical paraspinal musculature region. There was mild tends to palpation thoracic facet thoracic paraspinal musculature region. No crepitus of the thoracic region was noted. Patient appeared to be with bilaterally equal grip strength. Tinel and Phalen's maneuver were without increase of pain of significant degree. There was tends to palpation over the region of the lumbar facet lumbar paraspinal muscles region of moderate degree. Palpation of the right lumbar facet lumbar paraspinal musculature region reproduced dominant portion of patient's pain there was tenderness over the PSIS PSIS regions of moderate degree. Lateral bending rotation extension and palpation of the lumbar facets reproduce moderate discomfort. There was in this of the greater trochanteric  region iliotibial band region of mild degree. Straight leg raising tolerates approximately 30 without increase of pain with dorsiflexion noted. There was no definite sensory deficit of dermatomal distribution detected. There appeared to be negative clonus negative Homans. Abdomen was nontender with no costovertebral angle tenderness noted. Dominant portion of patient's pain reproduced with palpation of the lumbar facets lumbar paraspinal musculature regions.      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 Lennox Grumbles for evaliation of  BP and general medical  condition  F/U surgical evaluation. May consider pending follow-up evaluations  F/U neurological evaluation. May consider pending follow-up evaluations  May consider radiofrequency rhizolysis . At the present time we will request insurance approval for radiofrequency rhizolysis lumbar facets medial branch nerves. Patient has had greater than 50% relief of pain following lumbar facet, medial branch nerve blocks.   Patient to call Pain Management Center should patient have concerns prior to scheduled return appointment.

## 2015-02-12 NOTE — Patient Instructions (Addendum)
PLAN   Continue present medication Flexeril Cymbalta and hydrocodone acetaminophen  Lumbar facet, medial branch nerve, blocks to be performed at time return appointment  F/U PCP Dr. Lennox Grumbles for evaliation of  BP and general medical  condition  F/U surgical evaluation. May consider pending follow-up evaluations  F/U neurological evaluation. May consider pending follow-up evaluations  May consider radiofrequency rhizolysis MR facets medial branch nerves as discussed. We are requesting insurance approval for radiofrequency at this time and we will proceed with your lumbar facet blocks on October 19 as discussed  Patient to call [facility] should patient have concerns prior to scheduled return appointment.  Facet Joint Block The facet joints connect the bones of the spine (vertebrae). They make it possible for you to bend, twist, and make other movements with your spine. They also prevent you from overbending, overtwisting, and making other excessive movements.  A facet joint block is a procedure where a numbing medicine (anesthetic) is injected into a facet joint. Often, a type of anti-inflammatory medicine called a steroid is also injected. A facet joint block may be done for two reasons:   Diagnosis. A facet joint block may be done as a test to see whether neck or back pain is caused by a worn-down or infected facet joint. If the pain gets better after a facet joint block, it means the pain is probably coming from the facet joint. If the pain does not get better, it means the pain is probably not coming from the facet joint.   Therapy. A facet joint block may be done to relieve neck or back pain caused by a facet joint. A facet joint block is only done as a therapy if the pain does not improve with medicine, exercise programs, physical therapy, and other forms of pain management. LET [facility] CARE PROVIDER KNOW ABOUT:   Any allergies you have.   All medicines you are taking,  including vitamins, herbs, eyedrops, and over-the-counter medicines and creams.   Previous problems you or members of your family have had with the use of anesthetics.   Any blood disorders you have had.   Other health problems you have. RISKS AND COMPLICATIONS Generally, having a facet joint block is safe. However, as with any procedure, complications can occur. Possible complications associated with having a facet joint block include:   Bleeding.   Injury to a nerve near the injection site.   Pain at the injection site.   Weakness or numbness in areas controlled by nerves near the injection site.   Infection.   Temporary fluid retention.   Allergic reaction to anesthetics or medicines used during the procedure. BEFORE THE PROCEDURE   Follow your health care provider's instructions if you are taking dietary supplements or medicines. You may need to stop taking them or reduce your dosage.   Do not take any new dietary supplements or medicines without asking your health care provider first.   Follow your health care provider's instructions about eating and drinking before the procedure. You may need to stop eating and drinking several hours before the procedure.   Arrange to have an adult drive you home after the procedure. PROCEDURE  You may need to remove your clothing and dress in an open-back gown so that your health care provider can access your spine.   The procedure will be done while you are lying on an X-ray table. Most of the time you will be asked to lie on your stomach, but you  may be asked to lie in a different position if an injection will be made in your neck.   Special machines will be used to monitor your oxygen levels, heart rate, and blood pressure.   If an injection will be made in your neck, an intravenous (IV) tube will be inserted into one of your veins. Fluids and medicine will flow directly into your body through the IV tube.   The  area over the facet joint where the injection will be made will be cleaned with an antiseptic soap. The surrounding skin will be covered with sterile drapes.   An anesthetic will be applied to your skin to make the injection area numb. You may feel a temporary stinging or burning sensation.   A video X-ray machine will be used to locate the joint. A contrast dye may be injected into the facet joint area to help with locating the joint.   When the joint is located, an anesthetic medicine will be injected into the joint through the needle.   Your health care provider will ask you whether you feel pain relief. If you do feel relief, a steroid may be injected to provide pain relief for a longer period of time. If you do not feel relief or feel only partial relief, additional injections of an anesthetic may be made in other facet joints.   The needle will be removed, the skin will be cleansed, and bandages will be applied.  AFTER THE PROCEDURE   You will be observed for 15-30 minutes before being allowed to go home. Do not drive. Have an adult drive you or take a taxi or public transportation instead.   If you feel pain relief, the pain will return in several hours or days when the anesthetic wears off.   You may feel pain relief 2-14 days after the procedure. The amount of time this relief lasts varies from person to person.   It is normal to feel some tenderness over the injected area(s) for 2 days following the procedure.   If you have diabetes, you may have a temporary increase in blood sugar. Document Released: 10/06/2006 Document Revised: 10/01/2013 Document Reviewed: 03/06/2012 [facility] Patient Information 2015 [employer] [place], [place]. This information is not intended to replace advice given to you by your health care provider. Make sure you discuss any questions you have with your health care provider.  A prescription for HYDROCODONE, FLEXERIL was given to you today. A prescription  for CYMBALTA was sent to your pharmacy and should be available for pickup today.

## 2015-02-12 NOTE — Progress Notes (Signed)
Safety precautions to be maintained throughout the outpatient stay will include: orient to surroundings, keep bed in low position, maintain call bell within reach at all times, provide assistance with transfer out of bed and ambulation.  Discharged ambulatory at 2:10 pm

## 2015-03-17 ENCOUNTER — Telehealth: Payer: Self-pay

## 2015-03-17 NOTE — Telephone Encounter (Signed)
Pt needs prescription of cymbalta sent to the [facility]

## 2015-03-17 NOTE — Telephone Encounter (Signed)
Nurses Please clarify why patient needs Cymbalta prescription sent to [facility] and I will do what needs to be done to assist patient

## 2015-03-18 NOTE — Telephone Encounter (Signed)
Attempted to call patient, no answer 

## 2015-03-18 NOTE — Telephone Encounter (Signed)
Attempted to call patient, no answer. Last appt was 02-02-15. Next appt 04-02-15.

## 2015-03-19 ENCOUNTER — Ambulatory Visit: Payer: BC Managed Care – PPO | Admitting: Pain Medicine

## 2015-03-19 NOTE — Telephone Encounter (Signed)
Attempted to call patient, no answer 

## 2015-03-20 ENCOUNTER — Telehealth: Payer: Self-pay | Admitting: Pain Medicine

## 2015-03-20 NOTE — Telephone Encounter (Signed)
Spoke with patient, states she has already picked up the script.

## 2015-03-20 NOTE — Telephone Encounter (Signed)
Returning  Call / seems pharmacy sent refill notice to [facility] and they refilled Cymbalta / ms [place]land thought we called it in but name on bottle is pcp

## 2015-04-21 ENCOUNTER — Encounter: Payer: Self-pay | Admitting: Pain Medicine

## 2015-04-21 ENCOUNTER — Ambulatory Visit: Payer: BC Managed Care – PPO | Attending: Pain Medicine | Admitting: Pain Medicine

## 2015-04-21 VITALS — BP 117/67 | HR 83 | Temp 97.8°F | Resp 18 | Ht 66.0 in | Wt 178.0 lb

## 2015-04-21 DIAGNOSIS — M5126 Other intervertebral disc displacement, lumbar region: Secondary | ICD-10-CM | POA: Diagnosis not present

## 2015-04-21 DIAGNOSIS — M47816 Spondylosis without myelopathy or radiculopathy, lumbar region: Secondary | ICD-10-CM

## 2015-04-21 DIAGNOSIS — M545 Low back pain: Secondary | ICD-10-CM | POA: Insufficient documentation

## 2015-04-21 DIAGNOSIS — M79604 Pain in right leg: Secondary | ICD-10-CM | POA: Diagnosis present

## 2015-04-21 DIAGNOSIS — M5136 Other intervertebral disc degeneration, lumbar region: Secondary | ICD-10-CM

## 2015-04-21 DIAGNOSIS — M79605 Pain in left leg: Secondary | ICD-10-CM | POA: Diagnosis present

## 2015-04-21 DIAGNOSIS — M533 Sacrococcygeal disorders, not elsewhere classified: Secondary | ICD-10-CM

## 2015-04-21 MED ORDER — TRIAMCINOLONE ACETONIDE 40 MG/ML IJ SUSP: 40.0000 mg | Freq: Once | INTRAMUSCULAR | Status: AC

## 2015-04-21 MED ORDER — DULOXETINE HCL 20 MG PO CPEP: ORAL_CAPSULE | ORAL | Status: DC

## 2015-04-21 MED ORDER — HYDROCODONE-ACETAMINOPHEN 5-325 MG PO TABS: ORAL_TABLET | ORAL | Status: DC

## 2015-04-21 MED ORDER — CYCLOBENZAPRINE HCL 10 MG PO TABS: ORAL_TABLET | ORAL | Status: DC

## 2015-04-21 MED ORDER — BUPIVACAINE HCL (PF) 0.25 % IJ SOLN
30.0000 mL | Freq: Once | INTRAMUSCULAR | Status: AC
Start: 2015-04-21 — End: ?

## 2015-04-21 MED ORDER — BUPIVACAINE HCL (PF) 0.25 % IJ SOLN
INTRAMUSCULAR | Status: AC
  Administered 2015-04-21: 30 mL
  Filled 2015-04-21: qty 30

## 2015-04-21 MED ORDER — FENTANYL CITRATE (PF) 100 MCG/2ML IJ SOLN
100.0000 ug | Freq: Once | INTRAMUSCULAR | Status: AC
Start: 2015-04-21 — End: ?

## 2015-04-21 MED ORDER — ORPHENADRINE CITRATE 30 MG/ML IJ SOLN
INTRAMUSCULAR | Status: AC
  Administered 2015-04-21: 60 mg
  Filled 2015-04-21: qty 2

## 2015-04-21 MED ORDER — TRIAMCINOLONE ACETONIDE 40 MG/ML IJ SUSP
INTRAMUSCULAR | Status: AC
  Administered 2015-04-21: 40 mg
  Filled 2015-04-21: qty 1

## 2015-04-21 MED ORDER — MIDAZOLAM HCL 5 MG/5ML IJ SOLN: 5.0000 mg | Freq: Once | INTRAMUSCULAR | Status: AC

## 2015-04-21 MED ORDER — ORPHENADRINE CITRATE 30 MG/ML IJ SOLN: 60.0000 mg | Freq: Once | INTRAMUSCULAR | Status: AC

## 2015-04-21 MED ORDER — LACTATED RINGERS IV SOLN: 1000.0000 mL | INTRAVENOUS | Status: AC

## 2015-04-21 NOTE — Patient Instructions (Addendum)
PLAN   Continue present medication Flexeril Cymbalta and hydrocodone acetaminop  F/U PCP Dr. Lennox Grumbles for evaliation of  BP and general medical  condition  F/U surgical evaluation. May consider pending follow-up evaluations  F/U neurological evaluation. May consider pending follow-up evaluations  May consider radiofrequency rhizolysis MR facets medial branch nerves as discussed. We are requesting insurance approval for radiofrequency at this time . Ask Angie if you have been approved for radiofrequency rhizolysis lumbar facet  Patient to call Pain Management Center should patient have concerns prior to scheduled return appointment.Facet Joint Block The facet joints connect the bones of the spine (vertebrae). They make it possible for you to bend, twist, and make other movements with your spine. They also prevent you from overbending, overtwisting, and making other excessive movements.  A facet joint block is a procedure where a numbing medicine (anesthetic) is injected into a facet joint. Often, a type of anti-inflammatory medicine called a steroid is also injected. A facet joint block may be done for two reasons:   Diagnosis. A facet joint block may be done as a test to see whether neck or back pain is caused by a worn-down or infected facet joint. If the pain gets better after a facet joint block, it means the pain is probably coming from the facet joint. If the pain does not get better, it means the pain is probably not coming from the facet joint.   Therapy. A facet joint block may be done to relieve neck or back pain caused by a facet joint. A facet joint block is only done as a therapy if the pain does not improve with medicine, exercise programs, physical therapy, and other forms of pain management. LET [employer] CARE PROVIDER KNOW ABOUT:   Any allergies you have.   All medicines you are taking, including vitamins, herbs, eyedrops, and over-the-counter medicines and creams.    Previous problems you or members of your family have had with the use of anesthetics.   Any blood disorders you have had.   Other health problems you have. RISKS AND COMPLICATIONS Generally, having a facet joint block is safe. However, as with any procedure, complications can occur. Possible complications associated with having a facet joint block include:   Bleeding.   Injury to a nerve near the injection site.   Pain at the injection site.   Weakness or numbness in areas controlled by nerves near the injection site.   Infection.   Temporary fluid retention.   Allergic reaction to anesthetics or medicines used during the procedure. BEFORE THE PROCEDURE   Follow your health care provider's instructions if you are taking dietary supplements or medicines. You may need to stop taking them or reduce your dosage.   Do not take any new dietary supplements or medicines without asking your health care provider first.   Follow your health care provider's instructions about eating and drinking before the procedure. You may need to stop eating and drinking several hours before the procedure.   Arrange to have an adult drive you home after the procedure. PROCEDURE  You may need to remove your clothing and dress in an open-back gown so that your health care provider can access your spine.   The procedure will be done while you are lying on an X-ray table. Most of the time you will be asked to lie on your stomach, but you may be asked to lie in a different position if an injection will be made in your  neck.   Special machines will be used to monitor your oxygen levels, heart rate, and blood pressure.   If an injection will be made in your neck, an intravenous (IV) tube will be inserted into one of your veins. Fluids and medicine will flow directly into your body through the IV tube.   The area over the facet joint where the injection will be made will be cleaned with an  antiseptic soap. The surrounding skin will be covered with sterile drapes.   An anesthetic will be applied to your skin to make the injection area numb. You may feel a temporary stinging or burning sensation.   A video X-ray machine will be used to locate the joint. A contrast dye may be injected into the facet joint area to help with locating the joint.   When the joint is located, an anesthetic medicine will be injected into the joint through the needle.   Your health care provider will ask you whether you feel pain relief. If you do feel relief, a steroid may be injected to provide pain relief for a longer period of time. If you do not feel relief or feel only partial relief, additional injections of an anesthetic may be made in other facet joints.   The needle will be removed, the skin will be cleansed, and bandages will be applied.  AFTER THE PROCEDURE   You will be observed for 15-30 minutes before being allowed to go home. Do not drive. Have an adult drive you or take a taxi or public transportation instead.   If you feel pain relief, the pain will return in several hours or days when the anesthetic wears off.   You may feel pain relief 2-14 days after the procedure. The amount of time this relief lasts varies from person to person.   It is normal to feel some tenderness over the injected area(s) for 2 days following the procedure.   If you have diabetes, you may have a temporary increase in blood sugar.   This information is not intended to replace advice given to you by your health care provider. Make sure you discuss any questions you have with your health care provider.   Document Released: 10/06/2006 Document Revised: 06/07/2014 Document Reviewed: 03/06/2012 [employer] Interactive Patient Education 2016 [employer] Pain Management Discharge Instructions  General Discharge Instructions :  If you need to reach your doctor call: Monday-Friday 8:00 am - 4:00 pm at  [phone] or toll free [phone].  After clinic hours [phone] to have operator reach doctor.  Bring all of your medication bottles to all your appointments in the pain clinic.  To cancel or reschedule your appointment with Pain Management please remember to call 24 hours in advance to avoid a fee.  Refer to the educational materials which you have been given on: General Risks, I had my Procedure. Discharge Instructions, Post Sedation.  Post Procedure Instructions:  The drugs you were given will stay in your system until tomorrow, so for the next 24 hours you should not drive, make any legal decisions or drink any alcoholic beverages.  You may eat anything you prefer, but it is better to start with liquids then soups and crackers, and gradually work up to solid foods.  Please notify your doctor immediately if you have any unusual bleeding, trouble breathing or pain that is not related to your normal pain.  Depending on the type of procedure that was done, some parts of your body may feel week  and/or numb.  This usually clears up by tonight or the next day.  Walk with the use of an assistive device or accompanied by an adult for the 24 hours.  You may use ice on the affected area for the first 24 hours.  Put ice in a Ziploc bag and cover with a towel and place against area 15 minutes on 15 minutes off.  You may switch to heat after 24 hours.

## 2015-04-21 NOTE — Progress Notes (Signed)
Subjective:    Patient ID: Katherine Lester, female    DOB: August 25, 1955, 59 y.o.   MRN: [id number]  HPI  PROCEDURE PERFORMED: Lumbar facet (medial branch block)   NOTE: The patient is a 59 y.o. female who returns to [place] for further evaluation and treatment of pain involving the lumbar and lower extremity region. 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 The risks, benefits, and expectations of the procedure have been discussed and explained to the patient who was understanding and in agreement with suggested treatment plan. We will proceed with interventional treatment as discussed and as explained to the patient who was understanding and wished to proceed with procedure as planned.   DESCRIPTION OF PROCEDURE: Lumbar facet (medial branch block) with IV Versed, IV fentanyl conscious sedation, EKG, blood pressure, pulse, and pulse oximetry monitoring. The procedure was performed with the patient in the prone position. Betadine prep of proposed entry site performed.   NEEDLE PLACEMENT AT: Left L 3 lumbar facet (medial branch block). Under fluoroscopic guidance with oblique orientation of 15 degrees, a 22-gauge needle was inserted at the L 3 vertebral body level with needle placed at the targeted area of Burton's Eye or Eye of the Scotty Dog with documentation of needle placement in the superior and lateral border of targeted area of Burton's Eye or Eye of the Scotty Dog with oblique orientation of 15 degrees. Following documentation of needle placement at the L 3 vertebral body level, needle placement was then accomplished at the L 4 vertebral body level.   NEEDLE PLACEMENT AT L4  and L5 VERTEBRAL BODY LEVELS ON THE LEFT SIDE The procedure was performed at the L4 and L5 vertebral body levels exactly as was performed at the L 3 vertebral body level utilizing the same technique and under fluoroscopic guidance.  NEEDLE PLACEMENT AT THE SACRAL ALA with AP view of the lumbosacral spine. With the patient in the prone position, Betadine prep of proposed entry site accomplished, a 22 gauge needle was inserted in the region of the sacral ala (groove formed by the superior articulating process of S1 and the sacral wing). Following documentation of needle placement at the sacral ala,  needle placement was then accomplished at the S1 foramen level.   NEEDLE PLACEMENT AT THE S1 FORAMEN LEVEL under fluoroscopic guidance with AP view of the lumbosacral spine and cephalad orientation of the fluoroscope, a 22-gauge needle was placed at the superior and lateral border of the S1 foramen under fluoroscopic guidance. Following documentation of needle placement at the S1 foramen.   Needle placement was then verified at all levels on lateral view. Following documentation of needle placement at all levels on lateral view and following negative aspiration for heme and CSF, each level was injected with 1 mL of 0.25% bupivacaine with Kenalog.     LUMBAR FACET, MEDIAL BRANCH NERVE, BLOCKS PERFORMED ON THE RIGHT SIDE   The procedure was performed on the right side exactly as was performed on the left side at the same levels and utilizing the same technique under fluoroscopic guidance.     The patient tolerated the procedure well. A total of 40 mg of Kenalog was utilized for the  procedure.   PLAN:  1. Medications: The patient will continue presently prescribed medications. Cymbalta Flexeril and hydrocodone acetaminophen 2. May consider modification of treatment regimen at time of return appointment pending response to treatment rendered on today's visit. 3. The patient is to follow-up with primary  care physician  Dr. Delight Stare for further evaluation of blood pressure and general medical condition status post steroid injection performed on today's visit. 4. Surgical follow-up evaluation. 5. Neurological follow-up evaluation. 6. The patient may be candidate for radiofrequency procedures, implantation type procedures, and other treatment pending response to treatment and follow-up evaluation. We have requested insurance approval for radiofrequency rhizolysis lumbar facet medial branch nerves. The patient has had greater than 70% relief of pain with prior procedures consisting of lumbar facet medial branch nerve blocks. 7. The patient has been advised to call the Pain Management Center prior to scheduled return appointment should there be significant change in condition or should patient have other concerns regarding condition prior to scheduled return appointment.  The patient is understanding and in agreement with suggested treatment plan.      Review of Systems     Objective:   Physical Exam        Assessment & Plan:

## 2015-04-22 ENCOUNTER — Telehealth: Payer: Self-pay | Admitting: *Deleted

## 2015-04-22 NOTE — Telephone Encounter (Signed)
No answer at telephone number that is provided.

## 2015-04-30 ENCOUNTER — Other Ambulatory Visit: Payer: Self-pay | Admitting: Pain Medicine

## 2015-04-30 DIAGNOSIS — M533 Sacrococcygeal disorders, not elsewhere classified: Secondary | ICD-10-CM

## 2015-04-30 DIAGNOSIS — M47816 Spondylosis without myelopathy or radiculopathy, lumbar region: Secondary | ICD-10-CM

## 2015-04-30 DIAGNOSIS — M5136 Other intervertebral disc degeneration, lumbar region: Secondary | ICD-10-CM

## 2015-05-01 ENCOUNTER — Other Ambulatory Visit: Payer: Self-pay | Admitting: Pain Medicine

## 2015-05-19 ENCOUNTER — Ambulatory Visit: Payer: BC Managed Care – PPO | Attending: Pain Medicine | Admitting: Pain Medicine

## 2015-05-19 ENCOUNTER — Encounter: Payer: Self-pay | Admitting: Pain Medicine

## 2015-05-19 VITALS — BP 132/81 | HR 105 | Temp 98.3°F | Resp 18 | Ht 65.0 in | Wt 170.0 lb

## 2015-05-19 DIAGNOSIS — M533 Sacrococcygeal disorders, not elsewhere classified: Secondary | ICD-10-CM | POA: Insufficient documentation

## 2015-05-19 DIAGNOSIS — M545 Low back pain: Secondary | ICD-10-CM | POA: Diagnosis present

## 2015-05-19 DIAGNOSIS — M5136 Other intervertebral disc degeneration, lumbar region: Secondary | ICD-10-CM | POA: Diagnosis not present

## 2015-05-19 DIAGNOSIS — M5126 Other intervertebral disc displacement, lumbar region: Secondary | ICD-10-CM | POA: Insufficient documentation

## 2015-05-19 DIAGNOSIS — M79604 Pain in right leg: Secondary | ICD-10-CM | POA: Diagnosis present

## 2015-05-19 DIAGNOSIS — M79605 Pain in left leg: Secondary | ICD-10-CM | POA: Diagnosis present

## 2015-05-19 DIAGNOSIS — M47816 Spondylosis without myelopathy or radiculopathy, lumbar region: Secondary | ICD-10-CM

## 2015-05-19 MED ORDER — CYCLOBENZAPRINE HCL 10 MG PO TABS: ORAL_TABLET | ORAL | Status: DC

## 2015-05-19 MED ORDER — HYDROCODONE-ACETAMINOPHEN 5-325 MG PO TABS: ORAL_TABLET | ORAL | Status: DC

## 2015-05-19 NOTE — Patient Instructions (Addendum)
Continue present medications  Flexeril Cymbalta and hydrocodone acetaminophen  F/U PCP Dr. Lennox Grumbles for evaliation of  BP and general medical  condition  F/U surgical evaluation  Physical therapy as prescribed to meet criteria of insurance company to be approved for radiofrequency rhizolysis lumbar facet, medial branch nerves Please ask receptionist date of your appointment  F/U neurological evaluation  May consider radiofrequency rhizolysis or intraspinal procedures pending response to present treatment and F/U evaluation   Patient to call Pain Management Center should patient have concerns prior to scheduled return appointmen

## 2015-05-19 NOTE — Progress Notes (Signed)
Safety precautions to be maintained throughout the outpatient stay will include: orient to surroundings, keep bed in low position, maintain call bell within reach at all times, provide assistance with transfer out of bed and ambulation.  

## 2015-05-19 NOTE — Progress Notes (Signed)
Safety precautions to be maintained throughout the outpatient stay will include: orient to surroundings, keep bed in low position, maintain call bell within reach at all times, provide assistance with transfer out of bed and ambulation.  Currently using inhaler for recent pheumonia

## 2015-05-19 NOTE — Progress Notes (Signed)
Subjective:    Patient ID: Katherine Lester, female    DOB: 1956-01-26, 59 y.o.   MRN: [id number]  HPI  Patient sits 59-year-old female who returns to pain management Center for further evaluation and treatment of pain involving the lower back and lower extremity regions predominantly. The patient is in hopes of being able able to undergo radiofrequency rhizolysis lumbar facets medial branch nerves. The patient has [employer] and is unable to attend physical therapy as required by [facility] and therefore patient is unable to be approval for radiofrequency rhizolysis lumbar facet, medial branch nerves.. The patient has had significant relief of pain greater than 50% relief of pain and up to 70% relief of pain following prior lumbar facet, medial branch nerve blocks and would like to have radiofrequency procedure performed to provide relief of pain for a long a period of time. We will remain available to proceed with procedure pending insurance approval. At the present time patient will have difficulty attending physical therapy as required by [facility]. Revealed the patient will benefit significantly from radial freaks rhizolysis lumbar facet, medial branch nerve blocks and would like to request approval for the procedure to be performed at this time. The patient was understanding and agreement suggested treatment plan. The patient will continue presently prescribed medications and we will remain available to consider modification of treatment pending follow-up evaluation. All were in agreement with suggested treatment plan      Review of Systems     Objective:   Physical Exam   There was tends to palpation of the splenius capitis and occipitalis musculature region of mild degree. There was mild tenderness over the cervical facet cervical paraspinal musculature region. Patient appeared to be with bilaterally equal grip strength and Tinel and  Phalen's maneuver were without increased pain of significant degree. Patient appeared to be with bilaterally equal grip strength and there was unremarkable Spurling's maneuver No crepitus of the thoracic region was noted. Palpation over the lumbar paraspinal musculatures and lumbar facet region was tender to palpation of moderate degree. Lateral bending rotation extension and palpation of the lumbar facets reproduced moderate discomfort. Straight leg raising was tolerated to approximately 30 without an increase of pain with dorsiflexion noted. There was negative clonus negative Homans. There was no definite sensory deficit or dermatomal distribution detected. There was mild tenderness of the greater trochanteric region and iliotibial band region. There was mild to moderate tenderness of the PSIS and PII S region as well as the gluteal and piriformis musculature regions no sensory deficit or dermatomal distribution was detected. 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 Lennox Grumbles for evaliation of  BP and general medical  condition  F/U surgical evaluation  Physical therapy as prescribed to meet criteria of insurance company to be approved for radiofrequency rhizolysis lumbar facet, medial branch nerves Please ask receptionist date of your appointment for physical therapy  F/U neurological evaluation  May consider radiofrequency rhizolysis procedure pending response to present treatment and F/U evaluation   Patient to  call Pain Management Center should patient have concerns prior to scheduled return  appointmen

## 2015-05-20 ENCOUNTER — Encounter: Payer: BC Managed Care – PPO | Admitting: Pain Medicine

## 2015-05-21 ENCOUNTER — Telehealth: Payer: Self-pay | Admitting: Pain Medicine

## 2015-05-21 NOTE — Telephone Encounter (Signed)
Nurse please verify dosage of cyclobenzaprine 10mg   2-4 times daily / they are getting rejection due to high dosage/ usually is only 1 tab 3 times a day / please call [facility] [place] at [phone]

## 2015-05-22 NOTE — Telephone Encounter (Signed)
Dosage verified with pharmacy.

## 2015-06-16 ENCOUNTER — Encounter: Payer: BC Managed Care – PPO | Admitting: Pain Medicine

## 2015-06-24 ENCOUNTER — Encounter: Payer: BC Managed Care – PPO | Admitting: Pain Medicine

## 2015-06-30 ENCOUNTER — Ambulatory Visit: Payer: BC Managed Care – PPO | Attending: Pain Medicine | Admitting: Pain Medicine

## 2015-06-30 ENCOUNTER — Encounter: Payer: Self-pay | Admitting: Pain Medicine

## 2015-06-30 VITALS — BP 122/78 | HR 93 | Temp 97.8°F | Resp 16 | Ht 66.0 in | Wt 172.0 lb

## 2015-06-30 DIAGNOSIS — M79606 Pain in leg, unspecified: Secondary | ICD-10-CM | POA: Diagnosis present

## 2015-06-30 DIAGNOSIS — M533 Sacrococcygeal disorders, not elsewhere classified: Secondary | ICD-10-CM | POA: Diagnosis not present

## 2015-06-30 DIAGNOSIS — M5136 Other intervertebral disc degeneration, lumbar region: Secondary | ICD-10-CM

## 2015-06-30 DIAGNOSIS — M545 Low back pain: Secondary | ICD-10-CM | POA: Insufficient documentation

## 2015-06-30 DIAGNOSIS — M5126 Other intervertebral disc displacement, lumbar region: Secondary | ICD-10-CM | POA: Insufficient documentation

## 2015-06-30 DIAGNOSIS — M47816 Spondylosis without myelopathy or radiculopathy, lumbar region: Secondary | ICD-10-CM

## 2015-06-30 MED ORDER — DULOXETINE HCL 20 MG PO CPEP: ORAL_CAPSULE | ORAL | Status: DC

## 2015-06-30 MED ORDER — HYDROCODONE-ACETAMINOPHEN 5-325 MG PO TABS: ORAL_TABLET | ORAL | Status: DC

## 2015-06-30 MED ORDER — CYCLOBENZAPRINE HCL 10 MG PO TABS: ORAL_TABLET | ORAL | Status: DC

## 2015-06-30 NOTE — Progress Notes (Signed)
Safety precautions to be maintained throughout the outpatient stay will include: orient to surroundings, keep bed in low position, maintain call bell within reach at all times, provide assistance with transfer out of bed and ambulation.  

## 2015-06-30 NOTE — Progress Notes (Signed)
   Subjective:    Patient ID: Katherine Lester, female    DOB: Mar 18, 1956, 60 y.o.   MRN: [id number]  HPI The patient is a 60 year old female who returns to pain management for further evaluation and treatment of pain involving the lower back and lower extremity regions. The patient has had significant improvement of her pain with lumbar facet, medial branch nerve, blocks. The patient notes return of significant pain at this time and wishes to undergo lumbar facet, medial branch nerve, blocks at time return appointment. Patient tolerating Flexeril Cymbalta and hydrocodone acetaminophen without undesirable side effects and states that the pain has begin to return despite the use of the medications. The patient denies trauma change in events of daily living the cost change in symptomatology. We will proceed with interventional treatment at time return appointment. The patient agreed to suggested treatment plan  Review of Systems     Objective:   Physical Exam There was tenderness to palpation of the splenius capitis and occipitalis musculature region a mild degree with mild tenderness of the acromioclavicular and glenohumeral joint region. The patient was with unremarkable Spurling's maneuver. Tinel and Phalen's maneuver were without increase of pain and patient was with bilaterally equal grip strength. Palpation over the cervical facet cervical paraspinal musculature region and thoracic facet thoracic paraspinal musculature region reproduced minimal discomfort. There was no crepitus of the thoracic region noted. Palpation over the lumbar paraspinal musculatures and lumbar facet region associated with moderate to moderately severe discomfort right greater than the left with extension and palpation of the lumbar facets reproducing severe discomfort. There was moderate tenderness to palpation over the PSIS and PII S region. There was mild tenderness of the gluteal and piriformis muscles regions. Straight  leg raising was tolerates approximately 30 without increase of pain with dorsiflexion noted. There was moderate to tenderness to palpation along the greater trochanteric region and iliotibial band region. No sensory deficit or dermatomal dystrophy detected. There was negative clonus negative Homans. Abdomen nontender with no costovertebral tenderness noted.       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 Lennox Grumbles for evaliation of  BP and general medical  condition  F/U surgical evaluation  Physical therapy as prescribed to meet criteria of insurance company to be approved for radiofrequency rhizolysis lumbar facet, medial branch nerves  F/U neurological evaluation  May consider radiofrequency rhizolysis or intraspinal procedures pending response to present treatmenPat and F/U evaluation   Patient to call Pain Management Center should patient have concerns prior to scheduled return appointmeni

## 2015-06-30 NOTE — Patient Instructions (Addendum)
PLAN   Continue present medications  Flexeril Cymbalta and hydrocodone acetaminophen  Lumbar facet, medial branch nerve, blocks to be performed at time return of point  F/U PCP Dr. Lennox Grumbles for evaliation of  BP and general medical  condition  F/U surgical evaluation  Physical therapy as prescribed to meet criteria of insurance company to be approved for radiofrequency rhizolysis lumbar facet, medial branch nerves  F/U neurological evaluation  May consider radiofrequency rhizolysis or intraspinal procedures pending response to present treatmenPat and F/U evaluation   Patient to call [facility] should patient have concerns prior to scheduled return appointmenin Management Discharge Instructions  General Discharge Instructions :  If you need to reach your doctor call: Monday-Friday 8:00 am - 4:00 pm at [phone] or toll free [phone].  After clinic hours [phone] to have operator reach doctor.  Bring all of your medication bottles to all your appointments in the pain clinic.  To cancel or reschedule your appointment with Pain Management please remember to call 24 hours in advance to avoid a fee.  Refer to the educational materials which you have been given on: General Risks, I had my Procedure. Discharge Instructions, Post Sedation.  Post Procedure Instructions:  The drugs you were given will stay in your system until tomorrow, so for the next 24 hours you should not drive, make any legal decisions or drink any alcoholic beverages.  You may eat anything you prefer, but it is better to start with liquids then soups and crackers, and gradually work up to solid foods.  Please notify your doctor immediately if you have any unusual bleeding, trouble breathing or pain that is not related to your normal pain.  Depending on the type of procedure that was done, some parts of your body may feel week and/or numb.  This usually clears up by tonight or the next day.  Walk  with the use of an assistive device or accompanied by an adult for the 24 hours.  You may use ice on the affected area for the first 24 hours.  Put ice in a Ziploc bag and cover with a towel and place against area 15 minutes on 15 minutes off.  You may switch to heat after 24 hours.GENERAL RISKS AND COMPLICATIONS  What are the risk, side effects and possible complications? Generally speaking, most procedures are safe.  However, with any procedure there are risks, side effects, and the possibility of complications.  The risks and complications are dependent upon the sites that are lesioned, or the type of nerve block to be performed.  The closer the procedure is to the spine, the more serious the risks are.  Great care is taken when placing the radio frequency needles, block needles or lesioning probes, but sometimes complications can occur. 1. Infection: Any time there is an injection through the skin, there is a risk of infection.  This is why sterile conditions are used for these blocks.  There are four possible types of infection. 1. Localized skin infection. 2. Central Nervous System Infection-This can be in the form of Meningitis, which can be deadly. 3. Epidural Infections-This can be in the form of an epidural abscess, which can cause pressure inside of the spine, causing compression of the spinal cord with subsequent paralysis. This would require an emergency surgery to decompress, and there are no guarantees that the patient would recover from the paralysis. 4. Discitis-This is an infection of the intervertebral discs.  It occurs in about 1% of discography procedures.  It is difficult to treat and it may lead to surgery.        2. Pain: the needles have to go through skin and soft tissues, will cause soreness.       3. Damage to internal structures:  The nerves to be lesioned may be near blood vessels or    other nerves which can be potentially damaged.       4. Bleeding: Bleeding is more  common if the patient is taking blood thinners such as  aspirin, Coumadin, Ticiid, Plavix, etc., or if he/she have some genetic predisposition  such as hemophilia. Bleeding into the spinal canal can cause compression of the spinal  cord with subsequent paralysis.  This would require an emergency surgery to  decompress and there are no guarantees that the patient would recover from the  paralysis.       5. Pneumothorax:  Puncturing of a lung is a possibility, every time a needle is introduced in  the area of the chest or upper back.  Pneumothorax refers to free air around the  collapsed lung(s), inside of the thoracic cavity (chest cavity).  Another two possible  complications related to a similar event would include: Hemothorax and Chylothorax.   These are variations of the Pneumothorax, where instead of air around the collapsed  lung(s), you may have blood or chyle, respectively.       6. Spinal headaches: They may occur with any procedures in the area of the spine.       7. Persistent CSF (Cerebro-Spinal Fluid) leakage: This is a rare problem, but may occur  with prolonged intrathecal or epidural catheters either due to the formation of a fistulous  track or a dural tear.       8. Nerve damage: By working so close to the spinal cord, there is always a possibility of  nerve damage, which could be as serious as a permanent spinal cord injury with  paralysis.       9. Death:  Although rare, severe deadly allergic reactions known as "Anaphylactic  reaction" can occur to any of the medications used.      10. Worsening of the symptoms:  We can always make thing worse.  What are the chances of something like this happening? Chances of any of this occuring are extremely low.  By statistics, you have more of a chance of getting killed in a motor vehicle accident: while driving to the hospital than any of the above occurring .  Nevertheless, you should be aware that they are possibilities.  In general, it is  similar to taking a shower.  Everybody knows that you can slip, hit your head and get killed.  Does that mean that you should not shower again?  Nevertheless always keep in mind that statistics do not mean anything if you happen to be on the wrong side of them.  Even if a procedure has a 1 (one) in a 1,000,000 (million) chance of going wrong, it you happen to be that one..Also, keep in mind that by statistics, you have more of a chance of having something go wrong when taking medications.  Who should not have this procedure? If you are on a blood thinning medication (e.g. Coumadin, Plavix, see list of "Blood Thinners"), or if you have an active infection going on, you should not have the procedure.  If you are taking any blood thinners, please inform your physician.  How should I prepare for this procedure?  Do not eat or drink anything at least six hours prior to the procedure.  Bring a driver with you .  It cannot be a taxi.  Come accompanied by an adult that can drive you back, and that is strong enough to help you if your legs get weak or numb from the local anesthetic.  Take all of your medicines the morning of the procedure with just enough water to swallow them.  If you have diabetes, make sure that you are scheduled to have your procedure done first thing in the morning, whenever possible.  If you have diabetes, take only half of your insulin dose and notify our nurse that you have done so as soon as you arrive at the clinic.  If you are diabetic, but only take blood sugar pills (oral hypoglycemic), then do not take them on the morning of your procedure.  You may take them after you have had the procedure.  Do not take aspirin or any aspirin-containing medications, at least eleven (11) days prior to the procedure.  They may prolong bleeding.  Wear loose fitting clothing that may be easy to take off and that you would not mind if it got stained with Betadine or blood.  Do not wear any  jewelry or perfume  Remove any nail coloring.  It will interfere with some of our monitoring equipment.  NOTE: Remember that this is not meant to be interpreted as a complete list of all possible complications.  Unforeseen problems may occur.  BLOOD THINNERS The following drugs contain aspirin or other products, which can cause increased bleeding during surgery and should not be taken for 2 weeks prior to and 1 week after surgery.  If you should need take something for relief of minor pain, you may take acetaminophen which is found in Tylenol,m Datril, Anacin-3 and Panadol. It is not blood thinner. The products listed below are.  Do not take any of the products listed below in addition to any listed on your instruction sheet.  A.P.C or A.P.C with Codeine Codeine Phosphate Capsules #3 Ibuprofen Ridaura  ABC compound Congesprin Imuran rimadil  Advil Cope Indocin Robaxisal  Alka-Seltzer Effervescent Pain Reliever and Antacid Coricidin or Coricidin-D  Indomethacin Rufen  Alka-Seltzer plus Cold Medicine Cosprin Ketoprofen S-A-C Tablets  Anacin Analgesic Tablets or Capsules Coumadin Korlgesic Salflex  Anacin Extra Strength Analgesic tablets or capsules CP-2 Tablets Lanoril Salicylate  Anaprox Cuprimine Capsules Levenox Salocol  Anexsia-D Dalteparin Magan Salsalate  Anodynos Darvon compound Magnesium Salicylate Sine-off  Ansaid Dasin Capsules Magsal Sodium Salicylate  Anturane Depen Capsules Marnal Soma  APF Arthritis pain formula Dewitt's Pills Measurin Stanback  Argesic Dia-Gesic Meclofenamic Sulfinpyrazone  Arthritis Bayer Timed Release Aspirin Diclofenac Meclomen Sulindac  Arthritis pain formula Anacin Dicumarol Medipren Supac  Analgesic (Safety coated) Arthralgen Diffunasal Mefanamic Suprofen  Arthritis Strength Bufferin Dihydrocodeine Mepro Compound Suprol  Arthropan liquid Dopirydamole Methcarbomol with Aspirin Synalgos  ASA tablets/Enseals Disalcid Micrainin Tagament  Ascriptin Doan's  Midol Talwin  Ascriptin A/D Dolene Mobidin Tanderil  Ascriptin Extra Strength Dolobid Moblgesic Ticlid  Ascriptin with Codeine Doloprin or Doloprin with Codeine Momentum Tolectin  Asperbuf Duoprin Mono-gesic Trendar  Aspergum Duradyne Motrin or Motrin IB Triminicin  Aspirin plain, buffered or enteric coated Durasal Myochrisine Trigesic  Aspirin Suppositories Easprin Nalfon Trillsate  Aspirin with Codeine Ecotrin Regular or Extra Strength Naprosyn Uracel  Atromid-S Efficin Naproxen Ursinus  Auranofin Capsules Elmiron Neocylate Vanquish  Axotal Emagrin Norgesic Verin  Azathioprine Empirin or Empirin with Codeine Normiflo Vitamin E  Azolid Emprazil Nuprin Voltaren  Bayer Aspirin plain, buffered or children's or timed BC Tablets or powders Encaprin Orgaran Warfarin Sodium  Buff-a-Comp Enoxaparin Orudis Zorpin  Buff-a-Comp with Codeine Equegesic Os-Cal-Gesic   Buffaprin Excedrin plain, buffered or Extra Strength Oxalid   Bufferin Arthritis Strength Feldene Oxphenbutazone   Bufferin plain or Extra Strength Feldene Capsules Oxycodone with Aspirin   Bufferin with Codeine Fenoprofen Fenoprofen Pabalate or Pabalate-SF   Buffets II Flogesic Panagesic   Buffinol plain or Extra Strength Florinal or Florinal with Codeine Panwarfarin   Buf-Tabs Flurbiprofen Penicillamine   Butalbital Compound Four-way cold tablets Penicillin   Butazolidin Fragmin Pepto-Bismol   Carbenicillin Geminisyn Percodan   Carna Arthritis Reliever Geopen Persantine   Carprofen Gold's salt Persistin   Chloramphenicol Goody's Phenylbutazone   Chloromycetin Haltrain Piroxlcam   Clmetidine heparin Plaquenil   Cllnoril Hyco-pap Ponstel   Clofibrate Hydroxy chloroquine Propoxyphen         Before stopping any of these medications, be sure to consult the physician who ordered them.  Some, such as Coumadin (Warfarin) are ordered to prevent or treat serious conditions such as "deep thrombosis", "pumonary embolisms", and other heart  problems.  The amount of time that you may need off of the medication may also vary with the medication and the reason for which you were taking it.  If you are taking any of these medications, please make sure you notify your pain physician before you undergo any procedures.         Facet Joint Block, Care After Refer to this sheet in the next few weeks. These instructions provide you with information on caring for yourself after your procedure. Your health care provider may also give you more specific instructions. Your treatment has been planned according to current medical practices, but problems sometimes occur. Call your health care provider if you have any problems or questions after your procedure. HOME CARE INSTRUCTIONS  2. Keep track of the amount of pain relief you feel and how long it lasts. 3. Limit pain medicine within the first 4-6 hours after the procedure as directed by your health care provider. 4. Resume taking dietary supplements and medicines as directed by your health care provider. 5. You may resume your regular diet. 6. Do not apply heat near or over the injection site(s) for 24 hours.  7. Do not take a bath or soak in water (such as a pool or lake) for 24 hours. 8. Do not drive for 24 hours unless approved by your health care provider. 9. Avoid strenuous activity for 24 hours. 10. Remove your bandages the morning after the procedure.  11. If the injection site is tender, applying an ice pack may relieve some tenderness. To do this: 1. Put ice in a bag. 2. Place a towel between your skin and the bag. 3. Leave the ice on for 15-20 minutes, 3-4 times a day. 12. Keep follow-up appointments as directed by your health care provider. SEEK MEDICAL CARE IF:   Your pain is not controlled by your medicines.   There is drainage from the injection site.   There is significant bleeding or swelling at the injection site.  You have diabetes and your blood sugar is  above 180 mg/dL. SEEK IMMEDIATE MEDICAL CARE IF:   You develop a fever of 101F (38.3C) or greater.   You have worsening pain or swelling around the injection site.   You have red streaking around the injection site.   You develop severe pain that  is not controlled by your medicines.   You develop a headache, stiff neck, nausea, or vomiting.   Your eyes become very sensitive to light.   You have weakness, paralysis, or tingling in your arms or legs that was not present before the procedure.   You develop difficulty urinating or breathing.    This information is not intended to replace advice given to you by your health care provider. Make sure you discuss any questions you have with your health care provider.   Document Released: 05/03/2012 Document Revised: 06/07/2014 Document Reviewed: 05/03/2012 Elsevier Interactive Patient Education 2016 [employer] Facet Joint Block The facet joints connect the bones of the spine (vertebrae). They make it possible for you to bend, twist, and make other movements with your spine. They also prevent you from overbending, overtwisting, and making other excessive movements.  A facet joint block is a procedure where a numbing medicine (anesthetic) is injected into a facet joint. Often, a type of anti-inflammatory medicine called a steroid is also injected. A facet joint block may be done for two reasons:  13. Diagnosis. A facet joint block may be done as a test to see whether neck or back pain is caused by a worn-down or infected facet joint. If the pain gets better after a facet joint block, it means the pain is probably coming from the facet joint. If the pain does not get better, it means the pain is probably not coming from the facet joint.  14. Therapy. A facet joint block may be done to relieve neck or back pain caused by a facet joint. A facet joint block is only done as a therapy if the pain does not improve with medicine, exercise  programs, physical therapy, and other forms of pain management. LET [facility] CARE PROVIDER KNOW ABOUT:   Any allergies you have.   All medicines you are taking, including vitamins, herbs, eyedrops, and over-the-counter medicines and creams.   Previous problems you or members of your family have had with the use of anesthetics.   Any blood disorders you have had.   Other health problems you have. RISKS AND COMPLICATIONS Generally, having a facet joint block is safe. However, as with any procedure, complications can occur. Possible complications associated with having a facet joint block include:   Bleeding.   Injury to a nerve near the injection site.   Pain at the injection site.   Weakness or numbness in areas controlled by nerves near the injection site.   Infection.   Temporary fluid retention.   Allergic reaction to anesthetics or medicines used during the procedure. BEFORE THE PROCEDURE   Follow your health care provider's instructions if you are taking dietary supplements or medicines. You may need to stop taking them or reduce your dosage.   Do not take any new dietary supplements or medicines without asking your health care provider first.   Follow your health care provider's instructions about eating and drinking before the procedure. You may need to stop eating and drinking several hours before the procedure.   Arrange to have an adult drive you home after the procedure. PROCEDURE 12. You may need to remove your clothing and dress in an open-back gown so that your health care provider can access your spine.  13. The procedure will be done while you are lying on an X-ray table. Most of the time you will be asked to lie on your stomach, but you may be asked to lie in a  different position if an injection will be made in your neck.  14. Special machines will be used to monitor your oxygen levels, heart rate, and blood pressure.  15. If an injection  will be made in your neck, an intravenous (IV) tube will be inserted into one of your veins. Fluids and medicine will flow directly into your body through the IV tube.  16. The area over the facet joint where the injection will be made will be cleaned with an antiseptic soap. The surrounding skin will be covered with sterile drapes.  17. An anesthetic will be applied to your skin to make the injection area numb. You may feel a temporary stinging or burning sensation.  18. A video X-ray machine will be used to locate the joint. A contrast dye may be injected into the facet joint area to help with locating the joint.  19. When the joint is located, an anesthetic medicine will be injected into the joint through the needle.  54. Your health care provider will ask you whether you feel pain relief. If you do feel relief, a steroid may be injected to provide pain relief for a longer period of time. If you do not feel relief or feel only partial relief, additional injections of an anesthetic may be made in other facet joints.  21. The needle will be removed, the skin will be cleansed, and bandages will be applied.  AFTER THE PROCEDURE   You will be observed for 15-30 minutes before being allowed to go home. Do not drive. Have an adult drive you or take a taxi or public transportation instead.   If you feel pain relief, the pain will return in several hours or days when the anesthetic wears off.   You may feel pain relief 2-14 days after the procedure. The amount of time this relief lasts varies from person to person.   It is normal to feel some tenderness over the injected area(s) for 2 days following the procedure.   If you have diabetes, you may have a temporary increase in blood sugar.   This information is not intended to replace advice given to you by your health care provider. Make sure you discuss any questions you have with your health care provider.   Document Released: 10/06/2006  Document Revised: 06/07/2014 Document Reviewed: 03/06/2012 Elsevier Interactive Patient Education [employer].

## 2015-07-01 ENCOUNTER — Encounter: Payer: BC Managed Care – PPO | Admitting: Pain Medicine

## 2015-07-23 ENCOUNTER — Ambulatory Visit: Payer: BC Managed Care – PPO | Attending: Pain Medicine | Admitting: Pain Medicine

## 2015-07-23 ENCOUNTER — Encounter: Payer: Self-pay | Admitting: Pain Medicine

## 2015-07-23 VITALS — BP 126/87 | HR 68 | Temp 97.8°F | Resp 16 | Ht 65.0 in | Wt 170.0 lb

## 2015-07-23 DIAGNOSIS — M5136 Other intervertebral disc degeneration, lumbar region: Secondary | ICD-10-CM | POA: Diagnosis not present

## 2015-07-23 DIAGNOSIS — M533 Sacrococcygeal disorders, not elsewhere classified: Secondary | ICD-10-CM

## 2015-07-23 DIAGNOSIS — M5126 Other intervertebral disc displacement, lumbar region: Secondary | ICD-10-CM | POA: Insufficient documentation

## 2015-07-23 DIAGNOSIS — M47816 Spondylosis without myelopathy or radiculopathy, lumbar region: Secondary | ICD-10-CM

## 2015-07-23 DIAGNOSIS — M79606 Pain in leg, unspecified: Secondary | ICD-10-CM | POA: Diagnosis present

## 2015-07-23 DIAGNOSIS — M545 Low back pain: Secondary | ICD-10-CM | POA: Diagnosis present

## 2015-07-23 MED ORDER — MIDAZOLAM HCL 5 MG/5ML IJ SOLN: 5.0000 mg | Freq: Once | INTRAMUSCULAR | Status: AC

## 2015-07-23 MED ORDER — ORPHENADRINE CITRATE 30 MG/ML IJ SOLN
INTRAMUSCULAR | Status: AC
  Administered 2015-07-23: 10:00:00
  Filled 2015-07-23: qty 2

## 2015-07-23 MED ORDER — HYDROCODONE-ACETAMINOPHEN 5-325 MG PO TABS: ORAL_TABLET | ORAL | Status: DC

## 2015-07-23 MED ORDER — ORPHENADRINE CITRATE 30 MG/ML IJ SOLN: 60.0000 mg | Freq: Once | INTRAMUSCULAR | Status: AC

## 2015-07-23 MED ORDER — BUPIVACAINE HCL (PF) 0.25 % IJ SOLN: 30.0000 mL | Freq: Once | INTRAMUSCULAR | Status: AC

## 2015-07-23 MED ORDER — FENTANYL CITRATE (PF) 100 MCG/2ML IJ SOLN
100.0000 ug | Freq: Once | INTRAMUSCULAR | Status: AC
Start: 2015-07-23 — End: ?

## 2015-07-23 MED ORDER — FENTANYL CITRATE (PF) 100 MCG/2ML IJ SOLN
INTRAMUSCULAR | Status: AC
  Administered 2015-07-23: 50 ug via INTRAVENOUS
  Filled 2015-07-23: qty 2

## 2015-07-23 MED ORDER — BUPIVACAINE HCL (PF) 0.25 % IJ SOLN
INTRAMUSCULAR | Status: AC
  Administered 2015-07-23: 10:00:00
  Filled 2015-07-23: qty 30

## 2015-07-23 MED ORDER — TRIAMCINOLONE ACETONIDE 40 MG/ML IJ SUSP: 40.0000 mg | Freq: Once | INTRAMUSCULAR | Status: AC

## 2015-07-23 MED ORDER — MIDAZOLAM HCL 5 MG/5ML IJ SOLN
INTRAMUSCULAR | Status: AC
  Administered 2015-07-23: 4 mg via INTRAVENOUS
  Filled 2015-07-23: qty 5

## 2015-07-23 MED ORDER — LACTATED RINGERS IV SOLN: 1000.0000 mL | INTRAVENOUS | Status: AC

## 2015-07-23 MED ORDER — TRIAMCINOLONE ACETONIDE 40 MG/ML IJ SUSP
INTRAMUSCULAR | Status: AC
  Administered 2015-07-23: 10:00:00
  Filled 2015-07-23: qty 1

## 2015-07-23 MED ORDER — CYCLOBENZAPRINE HCL 10 MG PO TABS: ORAL_TABLET | ORAL | Status: DC

## 2015-07-23 NOTE — Patient Instructions (Addendum)
PLAN   Continue present medication Flexeril Cymbalta and hydrocodone acetaminophen  F/U PCP Dr. Lennox Grumbles for evaliation of  BP and general medical  condition  F/U surgical evaluation. May consider pending follow-up evaluations  F/U neurological evaluation. May consider pending follow-up evaluations  May consider radiofrequency rhizolysis MR facets medial branch nerves as discussed. We are requesting insurance approval for radiofrequency at this time . Ask Angie if you have been approved for radiofrequency rhizolysis lumbar facet  Patient to call [facility] should patient have concerns prior to scheduled return appointmentGENERAL RISKS AND COMPLICATIONS  What are the risk, side effects and possible complications? Generally speaking, most procedures are safe.  However, with any procedure there are risks, side effects, and the possibility of complications.  The risks and complications are dependent upon the sites that are lesioned, or the type of nerve block to be performed.  The closer the procedure is to the spine, the more serious the risks are.  Great care is taken when placing the radio frequency needles, block needles or lesioning probes, but sometimes complications can occur. 1. Infection: Any time there is an injection through the skin, there is a risk of infection.  This is why sterile conditions are used for these blocks.  There are four possible types of infection. 1. Localized skin infection. 2. Central Nervous System Infection-This can be in the form of Meningitis, which can be deadly. 3. Epidural Infections-This can be in the form of an epidural abscess, which can cause pressure inside of the spine, causing compression of the spinal cord with subsequent paralysis. This would require an emergency surgery to decompress, and there are no guarantees that the patient would recover from the paralysis. 4. Discitis-This is an infection of the intervertebral discs.  It occurs in about  1% of discography procedures.  It is difficult to treat and it may lead to surgery.        2. Pain: the needles have to go through skin and soft tissues, will cause soreness.       3. Damage to internal structures:  The nerves to be lesioned may be near blood vessels or    other nerves which can be potentially damaged.       4. Bleeding: Bleeding is more common if the patient is taking blood thinners such as  aspirin, Coumadin, Ticiid, Plavix, etc., or if he/she have some genetic predisposition  such as hemophilia. Bleeding into the spinal canal can cause compression of the spinal  cord with subsequent paralysis.  This would require an emergency surgery to  decompress and there are no guarantees that the patient would recover from the  paralysis.       5. Pneumothorax:  Puncturing of a lung is a possibility, every time a needle is introduced in  the area of the chest or upper back.  Pneumothorax refers to free air around the  collapsed lung(s), inside of the thoracic cavity (chest cavity).  Another two possible  complications related to a similar event would include: Hemothorax and Chylothorax.   These are variations of the Pneumothorax, where instead of air around the collapsed  lung(s), you may have blood or chyle, respectively.       6. Spinal headaches: They may occur with any procedures in the area of the spine.       7. Persistent CSF (Cerebro-Spinal Fluid) leakage: This is a rare problem, but may occur  with prolonged intrathecal or epidural catheters either due to the formation  of a fistulous  track or a dural tear.       8. Nerve damage: By working so close to the spinal cord, there is always a possibility of  nerve damage, which could be as serious as a permanent spinal cord injury with  paralysis.       9. Death:  Although rare, severe deadly allergic reactions known as "Anaphylactic  reaction" can occur to any of the medications used.      10. Worsening of the symptoms:  We can always make  thing worse.  What are the chances of something like this happening? Chances of any of this occuring are extremely low.  By statistics, you have more of a chance of getting killed in a motor vehicle accident: while driving to the hospital than any of the above occurring .  Nevertheless, you should be aware that they are possibilities.  In general, it is similar to taking a shower.  Everybody knows that you can slip, hit your head and get killed.  Does that mean that you should not shower again?  Nevertheless always keep in mind that statistics do not mean anything if you happen to be on the wrong side of them.  Even if a procedure has a 1 (one) in a 1,000,000 (million) chance of going wrong, it you happen to be that one..Also, keep in mind that by statistics, you have more of a chance of having something go wrong when taking medications.  Who should not have this procedure? If you are on a blood thinning medication (e.g. Coumadin, Plavix, see list of "Blood Thinners"), or if you have an active infection going on, you should not have the procedure.  If you are taking any blood thinners, please inform your physician.  How should I prepare for this procedure?  Do not eat or drink anything at least six hours prior to the procedure.  Bring a driver with you .  It cannot be a taxi.  Come accompanied by an adult that can drive you back, and that is strong enough to help you if your legs get weak or numb from the local anesthetic.  Take all of your medicines the morning of the procedure with just enough water to swallow them.  If you have diabetes, make sure that you are scheduled to have your procedure done first thing in the morning, whenever possible.  If you have diabetes, take only half of your insulin dose and notify our nurse that you have done so as soon as you arrive at the clinic.  If you are diabetic, but only take blood sugar pills (oral hypoglycemic), then do not take them on the morning  of your procedure.  You may take them after you have had the procedure.  Do not take aspirin or any aspirin-containing medications, at least eleven (11) days prior to the procedure.  They may prolong bleeding.  Wear loose fitting clothing that may be easy to take off and that you would not mind if it got stained with Betadine or blood.  Do not wear any jewelry or perfume  Remove any nail coloring.  It will interfere with some of our monitoring equipment.  NOTE: Remember that this is not meant to be interpreted as a complete list of all possible complications.  Unforeseen problems may occur.  BLOOD THINNERS The following drugs contain aspirin or other products, which can cause increased bleeding during surgery and should not be taken for 2 weeks prior to and  1 week after surgery.  If you should need take something for relief of minor pain, you may take acetaminophen which is found in Tylenol,m Datril, Anacin-3 and Panadol. It is not blood thinner. The products listed below are.  Do not take any of the products listed below in addition to any listed on your instruction sheet.  A.P.C or A.P.C with Codeine Codeine Phosphate Capsules #3 Ibuprofen Ridaura  ABC compound Congesprin Imuran rimadil  Advil Cope Indocin Robaxisal  Alka-Seltzer Effervescent Pain Reliever and Antacid Coricidin or Coricidin-D  Indomethacin Rufen  Alka-Seltzer plus Cold Medicine Cosprin Ketoprofen S-A-C Tablets  Anacin Analgesic Tablets or Capsules Coumadin Korlgesic Salflex  Anacin Extra Strength Analgesic tablets or capsules CP-2 Tablets Lanoril Salicylate  Anaprox Cuprimine Capsules Levenox Salocol  Anexsia-D Dalteparin Magan Salsalate  Anodynos Darvon compound Magnesium Salicylate Sine-off  Ansaid Dasin Capsules Magsal Sodium Salicylate  Anturane Depen Capsules Marnal Soma  APF Arthritis pain formula Dewitt's Pills Measurin Stanback  Argesic Dia-Gesic Meclofenamic Sulfinpyrazone  Arthritis Bayer Timed Release  Aspirin Diclofenac Meclomen Sulindac  Arthritis pain formula Anacin Dicumarol Medipren Supac  Analgesic (Safety coated) Arthralgen Diffunasal Mefanamic Suprofen  Arthritis Strength Bufferin Dihydrocodeine Mepro Compound Suprol  Arthropan liquid Dopirydamole Methcarbomol with Aspirin Synalgos  ASA tablets/Enseals Disalcid Micrainin Tagament  Ascriptin Doan's Midol Talwin  Ascriptin A/D Dolene Mobidin Tanderil  Ascriptin Extra Strength Dolobid Moblgesic Ticlid  Ascriptin with Codeine Doloprin or Doloprin with Codeine Momentum Tolectin  Asperbuf Duoprin Mono-gesic Trendar  Aspergum Duradyne Motrin or Motrin IB Triminicin  Aspirin plain, buffered or enteric coated Durasal Myochrisine Trigesic  Aspirin Suppositories Easprin Nalfon Trillsate  Aspirin with Codeine Ecotrin Regular or Extra Strength Naprosyn Uracel  Atromid-S Efficin Naproxen Ursinus  Auranofin Capsules Elmiron Neocylate Vanquish  Axotal Emagrin Norgesic Verin  Azathioprine Empirin or Empirin with Codeine Normiflo Vitamin E  Azolid Emprazil Nuprin Voltaren  Bayer Aspirin plain, buffered or children's or timed BC Tablets or powders Encaprin Orgaran Warfarin Sodium  Buff-a-Comp Enoxaparin Orudis Zorpin  Buff-a-Comp with Codeine Equegesic Os-Cal-Gesic   Buffaprin Excedrin plain, buffered or Extra Strength Oxalid   Bufferin Arthritis Strength Feldene Oxphenbutazone   Bufferin plain or Extra Strength Feldene Capsules Oxycodone with Aspirin   Bufferin with Codeine Fenoprofen Fenoprofen Pabalate or Pabalate-SF   Buffets II Flogesic Panagesic   Buffinol plain or Extra Strength Florinal or Florinal with Codeine Panwarfarin   Buf-Tabs Flurbiprofen Penicillamine   Butalbital Compound Four-way cold tablets Penicillin   Butazolidin Fragmin Pepto-Bismol   Carbenicillin Geminisyn Percodan   Carna Arthritis Reliever Geopen Persantine   Carprofen Gold's salt Persistin   Chloramphenicol Goody's Phenylbutazone   Chloromycetin Haltrain  Piroxlcam   Clmetidine heparin Plaquenil   Cllnoril Hyco-pap Ponstel   Clofibrate Hydroxy chloroquine Propoxyphen         Before stopping any of these medications, be sure to consult the physician who ordered them.  Some, such as Coumadin (Warfarin) are ordered to prevent or treat serious conditions such as "deep thrombosis", "pumonary embolisms", and other heart problems.  The amount of time that you may need off of the medication may also vary with the medication and the reason for which you were taking it.  If you are taking any of these medications, please make sure you notify your pain physician before you undergo any procedures.

## 2015-07-23 NOTE — Progress Notes (Signed)
Subjective:    Patient ID: Katherine Lester, female    DOB: Oct 23, 1955, 61 y.o.   MRN: [id number]  HPI PROCEDURE PERFORMED: Lumbar facet (medial branch block)   NOTE: The patient is a 60 y.o. female who returns to [place] for further evaluation and treatment of pain involving the lumbar and lower extremity region. 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 There is concern regarding intraspinal abnormalities contributing to patient's symptomatology with concern regarding significant component of patient's pain being due to lumbar facet syndrome. The risks, benefits, and expectations of the procedure have been discussed and explained to the patient who was understanding and in agreement with suggested treatment plan. We will proceed with interventional treatment as discussed and as explained to the patient who was understanding and wished to proceed with procedure as planned.   DESCRIPTION OF PROCEDURE: Lumbar facet (medial branch block) with IV Versed, IV fentanyl conscious sedation, EKG, blood pressure, pulse, and pulse oximetry monitoring. The procedure was performed with the patient in the prone position. Betadine prep of proposed entry site performed.   NEEDLE PLACEMENT AT: Left L 2 lumbar facet (medial branch block). Under fluoroscopic guidance with oblique orientation of 15 degrees, a 22-gauge needle was inserted at the L 2 vertebral body level with needle placed at the targeted area of Burton's Eye or Eye of the Scotty Dog with documentation of needle placement in the superior and lateral border of targeted area of Burton's Eye or Eye of the Scotty Dog with oblique orientation of 15 degrees. Following documentation of needle placement at the L 2 vertebral body level, needle placement was then  accomplished at the L 3 vertebral body level.   NEEDLE PLACEMENT AT L3, L4, and L5 VERTEBRAL BODY LEVELS ON THE LEFT SIDE The procedure was performed at the L3, L4, and L5 vertebral body levels exactly as was performed at the L 2 vertebral body level utilizing the same technique and under fluoroscopic guidance.  NEEDLE PLACEMENT AT THE SACRAL ALA with AP view of the lumbosacral spine. With the patient in the prone position, Betadine prep of proposed entry site accomplished, a 22 gauge needle was inserted in the region of the sacral ala (groove formed by the superior articulating process of S1 and the sacral wing). Following documentation of needle placement at the sacral ala,   Needle placement was then verified at all levels on lateral view. Following documentation of needle placement at all levels on lateral view and following negative aspiration for heme and CSF, each level was injected with 1 mL of 0.25% bupivacaine with Kenalog.     LUMBAR FACET, MEDIAL BRANCH NERVE, BLOCKS PERFORMED ON THE RIGHT SIDE   The procedure was performed on the right side exactly as was performed on the left side at the same levels and utilizing the same technique under fluoroscopic guidance.     The patient tolerated the procedure well. A total of 40 mg of Kenalog was utilized for the procedure.   PLAN:  1. Medications: The patient will continue presently prescribed medications. Flexeril and hydrocodone acetaminophen 2. May consider modification of treatment regimen at time of return appointment pending response to treatment rendered on today's visit. 3. The patient is to follow-up with primary care physician Dr. Lennox Grumbles for further evaluation of blood  pressure and general medical condition status post steroid injection performed on today's visit. 4. Surgical follow-up evaluation. Has been addressed 5. Neurological follow-up evaluation. May consider PNCV EMG studies and other studies 6. The patient may be  candidate for radiofrequency procedures, implantation type procedures, and other treatment pending response to treatment and follow-up evaluation. 7. The patient has been advised to call the [facility] Management Center prior to scheduled return appointment should there be significant change in condition or should patient have other concerns regarding condition prior to scheduled return appointment.  The patient is understanding and in agreement with suggested treatment plan.    Review of Systems     Objective:   Physical Exam        Assessment & Plan:

## 2015-07-24 ENCOUNTER — Telehealth: Payer: Self-pay | Admitting: Pain Medicine

## 2015-07-24 ENCOUNTER — Telehealth: Payer: Self-pay | Admitting: *Deleted

## 2015-07-24 NOTE — Telephone Encounter (Signed)
Patient verbalizes no concerns from procedure on yesterday.

## 2015-07-24 NOTE — Telephone Encounter (Signed)
Needs note for work for 2 days, she will call back with fax number to send it to

## 2015-07-24 NOTE — Telephone Encounter (Signed)
Attempted to call patient, no answer. Work note is completed, in the box at the nurse's station.

## 2015-07-29 ENCOUNTER — Encounter: Payer: BC Managed Care – PPO | Admitting: Pain Medicine

## 2015-08-21 ENCOUNTER — Encounter: Payer: Self-pay | Admitting: Pain Medicine

## 2015-08-21 ENCOUNTER — Other Ambulatory Visit: Payer: Self-pay | Admitting: Pain Medicine

## 2015-08-21 ENCOUNTER — Ambulatory Visit: Payer: BC Managed Care – PPO | Attending: Pain Medicine | Admitting: Pain Medicine

## 2015-08-21 VITALS — BP 131/74 | HR 78 | Temp 98.0°F | Resp 18 | Ht 66.0 in | Wt 175.0 lb

## 2015-08-21 DIAGNOSIS — M5126 Other intervertebral disc displacement, lumbar region: Secondary | ICD-10-CM | POA: Insufficient documentation

## 2015-08-21 DIAGNOSIS — M533 Sacrococcygeal disorders, not elsewhere classified: Secondary | ICD-10-CM | POA: Insufficient documentation

## 2015-08-21 DIAGNOSIS — M5136 Other intervertebral disc degeneration, lumbar region: Secondary | ICD-10-CM

## 2015-08-21 DIAGNOSIS — M545 Low back pain: Secondary | ICD-10-CM | POA: Diagnosis present

## 2015-08-21 DIAGNOSIS — M79606 Pain in leg, unspecified: Secondary | ICD-10-CM | POA: Diagnosis present

## 2015-08-21 DIAGNOSIS — M47816 Spondylosis without myelopathy or radiculopathy, lumbar region: Secondary | ICD-10-CM

## 2015-08-21 DIAGNOSIS — M51369 Other intervertebral disc degeneration, lumbar region without mention of lumbar back pain or lower extremity pain: Secondary | ICD-10-CM

## 2015-08-21 MED ORDER — CYCLOBENZAPRINE HCL 10 MG PO TABS: ORAL_TABLET | ORAL | Status: DC

## 2015-08-21 MED ORDER — HYDROCODONE-ACETAMINOPHEN 5-325 MG PO TABS: ORAL_TABLET | ORAL | Status: DC

## 2015-08-21 MED ORDER — DULOXETINE HCL 20 MG PO CPEP: ORAL_CAPSULE | ORAL | Status: DC

## 2015-08-21 NOTE — Progress Notes (Signed)
Subjective:    Patient ID: Katherine Lester, female    DOB: [birth year], 58 y.o.   MRN: [id number]  HPI  Patient is a 60 year old female who returns to pain management for further evaluation and treatment of pain involving the lower back and lower extremity region. The patient has had significant improvement of pain following lumbar facet, medial branch nerve, blocks. The patient continues medications consisting of Flexeril Cymbalta and hydrocodone acetaminophen. The patient is tolerating medications well without undesirable side effects. The patient has had return of pain involving the lower back and lower extremity region of moderate degree with activities at times aggravating patient's pain. The patient denies any trauma change in events of daily living the call significant change in symptomatology. We will consider performing lumbar facet, medial branch nerve, blocks in attempt to decrease severity of patient's symptoms, minimize progression of patient's symptoms, and avoid the need for more involved treatment. We discussed patient's condition and have scheduled date to perform procedure. The patient will continue medications as prescribed and we will consider modification of treatment regimen pending further assessment of patient's condition. All agreed to suggested treatment plan.       Review of Systems     Objective:   Physical Exam   There was mild tenderness of the splenius capitis and occipitalis musculature region. Palpation of the cervical facet cervical paraspinal musculature region was attends to palpation of minimal degree. There was minimal tenderness over the upper and mid thoracic region thoracic facet region with mild to moderate tenderness of the lower thoracic paraspinal musculature region. No crepitus of the thoracic region was noted. Palpation of the acromioclavicular and glenohumeral joint regions were attends to palpation of minimal degree. The patient appeared to be  unremarkable Spurling's maneuver. Tinel's and Phalen's maneuver were without increased pain of significant degree. The patient appeared to be with bilaterally equal grip strength. Palpation over the lumbar paraspinal musculature region lumbar facet region was attends to palpation of moderate degree with lateral bending rotation extension and palpation of the lumbar facets reproducing moderate discomfort left greater than the right. There was tenderness to palpation of the PSIS and PII S region a moderate degree. There was moderate to moderate degree severe tenderness of the greater trochanteric region and iliotibial band regions. Straight leg raising was tolerates approximately 30 without increase of pain with dorsiflexion noted. DTRs were difficult to elicit patient had difficulty relaxing. No sensory deficit or dermatomal distribution detected. There was negative clonus negative Homans. Abdomen nontender with no costovertebral tenderness noted.     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 Lennox Grumbles for evaliation of  BP and general medical  condition  F/U surgical evaluation We will avoid surgical evaluation at this time  Physical therapy as prescribed to meet criteria of insurance company to be approved for radiofrequency rhizolysis lumbar facet, medial branch nerves  F/U neurological evaluation We will avoid PNCV/EMG studies and other studies at this time  May consider radiofrequency rhizolysis or intraspinal procedures pending response to present treatmenPat and F/U evaluation   Patient to call Pain  Management Center should patient have concerns prior to scheduled return appointmenint

## 2015-08-21 NOTE — Progress Notes (Signed)
Safety precautions to be maintained throughout the outpatient stay will include: orient to surroundings, keep bed in low position, maintain call bell within reach at all times, provide assistance with transfer out of bed and ambulation.  

## 2015-08-21 NOTE — Patient Instructions (Addendum)
PLAN   Continue present medications  Flexeril Cymbalta and hydrocodone acetaminophen  F/U PCP Dr. Lennox Grumbles for evaliation of  BP and general medical  condition  F/U surgical evaluation We will avoid surgical evaluation at this time  Physical therapy as prescribed to meet criteria of insurance company to be approved for radiofrequency rhizolysis lumbar facet, medial branch nerves  F/U neurological evaluation We will avoid PNCV/EMG studies and other studies at this time  May consider radiofrequency rhizolysis or intraspinal procedures pending response to present treatmenPat and F/U evaluation   Patient to call Pain Management Center should patient have concerns prior to scheduled return appointmenin

## 2015-08-25 ENCOUNTER — Other Ambulatory Visit: Payer: Self-pay | Admitting: Pain Medicine

## 2015-08-25 DIAGNOSIS — M47816 Spondylosis without myelopathy or radiculopathy, lumbar region: Secondary | ICD-10-CM

## 2015-08-25 DIAGNOSIS — M5136 Other intervertebral disc degeneration, lumbar region: Secondary | ICD-10-CM

## 2015-08-25 DIAGNOSIS — M533 Sacrococcygeal disorders, not elsewhere classified: Secondary | ICD-10-CM

## 2015-09-01 ENCOUNTER — Telehealth: Payer: Self-pay

## 2015-09-01 NOTE — Telephone Encounter (Signed)
Pt wants to have procedure. She said Dr.Crisp spoke with her about having a procedure she said it was called bursa but I'm not for sure.

## 2015-09-02 ENCOUNTER — Other Ambulatory Visit: Payer: Self-pay | Admitting: Pain Medicine

## 2015-09-02 DIAGNOSIS — M7060 Trochanteric bursitis, unspecified hip: Secondary | ICD-10-CM

## 2015-09-02 DIAGNOSIS — M533 Sacrococcygeal disorders, not elsewhere classified: Secondary | ICD-10-CM

## 2015-09-02 DIAGNOSIS — M5136 Other intervertebral disc degeneration, lumbar region: Secondary | ICD-10-CM

## 2015-09-02 DIAGNOSIS — M47816 Spondylosis without myelopathy or radiculopathy, lumbar region: Secondary | ICD-10-CM

## 2015-09-02 NOTE — Telephone Encounter (Signed)
Attempted to call patient again, no answer.

## 2015-09-02 NOTE — Telephone Encounter (Signed)
Attempted to return call to patient, no answer.

## 2015-09-03 NOTE — Telephone Encounter (Signed)
Attempted to call patient, no answer 

## 2015-09-08 ENCOUNTER — Encounter: Payer: Self-pay | Admitting: Pain Medicine

## 2015-09-08 ENCOUNTER — Ambulatory Visit: Payer: BC Managed Care – PPO | Attending: Pain Medicine | Admitting: Pain Medicine

## 2015-09-08 VITALS — BP 139/83 | HR 83 | Temp 97.8°F | Resp 16 | Ht 65.0 in | Wt 175.0 lb

## 2015-09-08 DIAGNOSIS — M25551 Pain in right hip: Secondary | ICD-10-CM | POA: Insufficient documentation

## 2015-09-08 DIAGNOSIS — M47816 Spondylosis without myelopathy or radiculopathy, lumbar region: Secondary | ICD-10-CM

## 2015-09-08 DIAGNOSIS — M533 Sacrococcygeal disorders, not elsewhere classified: Secondary | ICD-10-CM

## 2015-09-08 DIAGNOSIS — M5136 Other intervertebral disc degeneration, lumbar region: Secondary | ICD-10-CM

## 2015-09-08 MED ORDER — TRIAMCINOLONE ACETONIDE 40 MG/ML IJ SUSP
INTRAMUSCULAR | Status: AC
  Administered 2015-09-08: 10:00:00
  Filled 2015-09-08: qty 1

## 2015-09-08 MED ORDER — TRIAMCINOLONE ACETONIDE 40 MG/ML IJ SUSP: 40.0000 mg | Freq: Once | INTRAMUSCULAR | Status: AC

## 2015-09-08 MED ORDER — ORPHENADRINE CITRATE 30 MG/ML IJ SOLN
INTRAMUSCULAR | Status: AC
  Filled 2015-09-08: qty 2

## 2015-09-08 MED ORDER — HYDROCODONE-ACETAMINOPHEN 5-325 MG PO TABS: ORAL_TABLET | ORAL | Status: DC

## 2015-09-08 MED ORDER — DULOXETINE HCL 20 MG PO CPEP
ORAL_CAPSULE | ORAL | Status: DC
Start: 2015-09-08 — End: 2015-09-24

## 2015-09-08 MED ORDER — ORPHENADRINE CITRATE 30 MG/ML IJ SOLN: 60.0000 mg | Freq: Once | INTRAMUSCULAR | Status: AC

## 2015-09-08 MED ORDER — CYCLOBENZAPRINE HCL 10 MG PO TABS: ORAL_TABLET | ORAL | Status: DC

## 2015-09-08 MED ORDER — LACTATED RINGERS IV SOLN: 1000.0000 mL | INTRAVENOUS | Status: DC

## 2015-09-08 MED ORDER — BUPIVACAINE HCL (PF) 0.25 % IJ SOLN: 30.0000 mL | Freq: Once | INTRAMUSCULAR | Status: AC

## 2015-09-08 MED ORDER — FENTANYL CITRATE (PF) 100 MCG/2ML IJ SOLN: 100.0000 ug | Freq: Once | INTRAMUSCULAR | Status: DC

## 2015-09-08 MED ORDER — BUPIVACAINE HCL (PF) 0.25 % IJ SOLN
INTRAMUSCULAR | Status: AC
  Administered 2015-09-08: 10:00:00
  Filled 2015-09-08: qty 30

## 2015-09-08 MED ORDER — MIDAZOLAM HCL 5 MG/5ML IJ SOLN: 5.0000 mg | Freq: Once | INTRAMUSCULAR | Status: DC

## 2015-09-08 NOTE — Progress Notes (Signed)
   Subjective:    Patient ID: Katherine Lester, female    DOB: 1955/06/12, 60 y.o.   MRN: [id number]  HPI                                    GREATER TROCHANTERIC BURSA INJECTION   The patient is a 60 year old female who returns to pain management for further evaluation of severely disabling pain involving the greater trochanteric region on the right. We have discussed patient's condition and explained to patient the risks benefits and expectations of the procedure. Decision was made to proceed with interventional treatment consisting of right greater trochanteric bursa injection in attempt to decrease severity of patient's symptoms, minimize progression of patient's symptoms, and for more involved treatment. The patient is with understanding and agreement suggested treatment plan  Description Of Procedure:  Right Greater Trochanteric Bursa Injection   The patient was placed in the lateral decubitus position with EKG, blood pressure, pulse, pulse oximetry monitoring all in place. Betadine prep of proposed entry site was accomplished for right greater trochanteric bursa injection. Following identification of landmarks for right greater trochanteric bursa injection and Betadine prep of proposed entry site a 22-gauge needle was inserted in the greater trochanteric region on the right. A total of 10 cc of 0.25% bupivacaine with Kenalog was injected incrementally for right greater trochanteric bursa injection. The needle was removed and then reinserted and an additional 10 cc of 0.25% bupivacaine with Kenalog was injected for right greater trochanteric bursa injection. Needle was removed   The patient tolerated the procedure well  A total of 20 mg of Kenalog was utilized for the entire procedure      PLAN   Continue present medications  Flexeril Cymbalta and hydrocodone acetaminophen  Lumbar facet, medial branch nerve, blocks to be considered pending  follow-up evaluation  F/U PCP Dr. Lennox Grumbles  for evaliation of  BP and general medical  condition  F/U surgical evaluation We will avoid surgical evaluation at this time  Physical therapy as prescribed to meet criteria of insurance company to be approved for radiofrequency rhizolysis lumbar facet, medial branch nerves as previously discussed  Ask Shatoya  And Angie if you have been approved for radiofrequency lumbar facets  F/U neurological evaluation We will avoid PNCV/EMG studies and other studies at this time  May consider radiofrequency rhizolysis or intraspinal procedures pending response to present treatmenPat and F/U evaluation   Patient to call Pain Management Center should patient have concerns prior to scheduled return appointmenint  Review of Systems     Objective:   Physical Exam        Assessment & Plan:

## 2015-09-08 NOTE — Progress Notes (Signed)
Patient here for procedure d/t hip pain Safety precautions to be maintained throughout the outpatient stay will include: orient to surroundings, keep bed in low position, maintain call bell within reach at all times, provide assistance with transfer out of bed and ambulation.

## 2015-09-08 NOTE — Patient Instructions (Addendum)
PLAN   Continue present medications  Flexeril Cymbalta and hydrocodone acetaminophen  F/U PCP Dr. Lennox Grumbles for evaliation of  BP and general medical  condition  F/U surgical evaluation We will avoid surgical evaluation at this time  Physical therapy as prescribed to meet criteria of insurance company to be approved for radiofrequency rhizolysis lumbar facet, medial branch nerves as previously discussed  Ask Shatoya  And Angie if you have been approved for radiofrequency lumbar facets  F/U neurological evaluation We will avoid PNCV/EMG studies and other studies at this time  May consider radiofrequency rhizolysis or intraspinal procedures pending response to present treatmenPat and F/U evaluation   Patient to call [facility] should patient have concerns prior to scheduled return appointmenintGENERAL RISKS AND COMPLICATIONS  What are the risk, side effects and possible complications? Generally speaking, most procedures are safe.  However, with any procedure there are risks, side effects, and the possibility of complications.  The risks and complications are dependent upon the sites that are lesioned, or the type of nerve block to be performed.  The closer the procedure is to the spine, the more serious the risks are.  Great care is taken when placing the radio frequency needles, block needles or lesioning probes, but sometimes complications can occur.  Infection: Any time there is an injection through the skin, there is a risk of infection.  This is why sterile conditions are used for these blocks.  There are four possible types of infection.  Localized skin infection.  Central Nervous System Infection-This can be in the form of Meningitis, which can be deadly.  Epidural Infections-This can be in the form of an epidural abscess, which can cause pressure inside of the spine, causing compression of the spinal cord with subsequent paralysis. This would require an emergency surgery to  decompress, and there are no guarantees that the patient would recover from the paralysis.  Discitis-This is an infection of the intervertebral discs.  It occurs in about 1% of discography procedures.  It is difficult to treat and it may lead to surgery.        2. Pain: the needles have to go through skin and soft tissues, will cause soreness.       3. Damage to internal structures:  The nerves to be lesioned may be near blood vessels or    other nerves which can be potentially damaged.       4. Bleeding: Bleeding is more common if the patient is taking blood thinners such as  aspirin, Coumadin, Ticiid, Plavix, etc., or if he/she have some genetic predisposition  such as hemophilia. Bleeding into the spinal canal can cause compression of the spinal  cord with subsequent paralysis.  This would require an emergency surgery to  decompress and there are no guarantees that the patient would recover from the  paralysis.       5. Pneumothorax:  Puncturing of a lung is a possibility, every time a needle is introduced in  the area of the chest or upper back.  Pneumothorax refers to free air around the  collapsed lung(s), inside of the thoracic cavity (chest cavity).  Another two possible  complications related to a similar event would include: Hemothorax and Chylothorax.   These are variations of the Pneumothorax, where instead of air around the collapsed  lung(s), you may have blood or chyle, respectively.       6. Spinal headaches: They may occur with any procedures in the area of the spine.  7. Persistent CSF (Cerebro-Spinal Fluid) leakage: This is a rare problem, but may occur  with prolonged intrathecal or epidural catheters either due to the formation of a fistulous  track or a dural tear.       8. Nerve damage: By working so close to the spinal cord, there is always a possibility of  nerve damage, which could be as serious as a permanent spinal cord injury with  paralysis.       9. Death:  Although  rare, severe deadly allergic reactions known as "Anaphylactic  reaction" can occur to any of the medications used.      10. Worsening of the symptoms:  We can always make thing worse.  What are the chances of something like this happening? Chances of any of this occuring are extremely low.  By statistics, you have more of a chance of getting killed in a motor vehicle accident: while driving to the hospital than any of the above occurring .  Nevertheless, you should be aware that they are possibilities.  In general, it is similar to taking a shower.  Everybody knows that you can slip, hit your head and get killed.  Does that mean that you should not shower again?  Nevertheless always keep in mind that statistics do not mean anything if you happen to be on the wrong side of them.  Even if a procedure has a 1 (one) in a 1,000,000 (million) chance of going wrong, it you happen to be that one..Also, keep in mind that by statistics, you have more of a chance of having something go wrong when taking medications.  Who should not have this procedure? If you are on a blood thinning medication (e.g. Coumadin, Plavix, see list of "Blood Thinners"), or if you have an active infection going on, you should not have the procedure.  If you are taking any blood thinners, please inform your physician.  How should I prepare for this procedure?  Do not eat or drink anything at least six hours prior to the procedure.  Bring a driver with you .  It cannot be a taxi.  Come accompanied by an adult that can drive you back, and that is strong enough to help you if your legs get weak or numb from the local anesthetic.  Take all of your medicines the morning of the procedure with just enough water to swallow them.  If you have diabetes, make sure that you are scheduled to have your procedure done first thing in the morning, whenever possible.  If you have diabetes, take only half of your insulin dose and notify our nurse  that you have done so as soon as you arrive at the clinic.  If you are diabetic, but only take blood sugar pills (oral hypoglycemic), then do not take them on the morning of your procedure.  You may take them after you have had the procedure.  Do not take aspirin or any aspirin-containing medications, at least eleven (11) days prior to the procedure.  They may prolong bleeding.  Wear loose fitting clothing that may be easy to take off and that you would not mind if it got stained with Betadine or blood.  Do not wear any jewelry or perfume  Remove any nail coloring.  It will interfere with some of our monitoring equipment.  NOTE: Remember that this is not meant to be interpreted as a complete list of all possible complications.  Unforeseen problems may occur.  BLOOD THINNERS  The following drugs contain aspirin or other products, which can cause increased bleeding during surgery and should not be taken for 2 weeks prior to and 1 week after surgery.  If you should need take something for relief of minor pain, you may take acetaminophen which is found in Tylenol,m Datril, Anacin-3 and Panadol. It is not blood thinner. The products listed below are.  Do not take any of the products listed below in addition to any listed on your instruction sheet.  A.P.C or A.P.C with Codeine Codeine Phosphate Capsules #3 Ibuprofen Ridaura  ABC compound Congesprin Imuran rimadil  Advil Cope Indocin Robaxisal  Alka-Seltzer Effervescent Pain Reliever and Antacid Coricidin or Coricidin-D  Indomethacin Rufen  Alka-Seltzer plus Cold Medicine Cosprin Ketoprofen S-A-C Tablets  Anacin Analgesic Tablets or Capsules Coumadin Korlgesic Salflex  Anacin Extra Strength Analgesic tablets or capsules CP-2 Tablets Lanoril Salicylate  Anaprox Cuprimine Capsules Levenox Salocol  Anexsia-D Dalteparin Magan Salsalate  Anodynos Darvon compound Magnesium Salicylate Sine-off  Ansaid Dasin Capsules Magsal Sodium Salicylate  Anturane  Depen Capsules Marnal Soma  APF Arthritis pain formula Dewitt's Pills Measurin Stanback  Argesic Dia-Gesic Meclofenamic Sulfinpyrazone  Arthritis Bayer Timed Release Aspirin Diclofenac Meclomen Sulindac  Arthritis pain formula Anacin Dicumarol Medipren Supac  Analgesic (Safety coated) Arthralgen Diffunasal Mefanamic Suprofen  Arthritis Strength Bufferin Dihydrocodeine Mepro Compound Suprol  Arthropan liquid Dopirydamole Methcarbomol with Aspirin Synalgos  ASA tablets/Enseals Disalcid Micrainin Tagament  Ascriptin Doan's Midol Talwin  Ascriptin A/D Dolene Mobidin Tanderil  Ascriptin Extra Strength Dolobid Moblgesic Ticlid  Ascriptin with Codeine Doloprin or Doloprin with Codeine Momentum Tolectin  Asperbuf Duoprin Mono-gesic Trendar  Aspergum Duradyne Motrin or Motrin IB Triminicin  Aspirin plain, buffered or enteric coated Durasal Myochrisine Trigesic  Aspirin Suppositories Easprin Nalfon Trillsate  Aspirin with Codeine Ecotrin Regular or Extra Strength Naprosyn Uracel  Atromid-S Efficin Naproxen Ursinus  Auranofin Capsules Elmiron Neocylate Vanquish  Axotal Emagrin Norgesic Verin  Azathioprine Empirin or Empirin with Codeine Normiflo Vitamin E  Azolid Emprazil Nuprin Voltaren  Bayer Aspirin plain, buffered or children's or timed BC Tablets or powders Encaprin Orgaran Warfarin Sodium  Buff-a-Comp Enoxaparin Orudis Zorpin  Buff-a-Comp with Codeine Equegesic Os-Cal-Gesic   Buffaprin Excedrin plain, buffered or Extra Strength Oxalid   Bufferin Arthritis Strength Feldene Oxphenbutazone   Bufferin plain or Extra Strength Feldene Capsules Oxycodone with Aspirin   Bufferin with Codeine Fenoprofen Fenoprofen Pabalate or Pabalate-SF   Buffets II Flogesic Panagesic   Buffinol plain or Extra Strength Florinal or Florinal with Codeine Panwarfarin   Buf-Tabs Flurbiprofen Penicillamine   Butalbital Compound Four-way cold tablets Penicillin   Butazolidin Fragmin Pepto-Bismol   Carbenicillin  Geminisyn Percodan   Carna Arthritis Reliever Geopen Persantine   Carprofen Gold's salt Persistin   Chloramphenicol Goody's Phenylbutazone   Chloromycetin Haltrain Piroxlcam   Clmetidine heparin Plaquenil   Cllnoril Hyco-pap Ponstel   Clofibrate Hydroxy chloroquine Propoxyphen         Before stopping any of these medications, be sure to consult the physician who ordered them.  Some, such as Coumadin (Warfarin) are ordered to prevent or treat serious conditions such as "deep thrombosis", "pumonary embolisms", and other heart problems.  The amount of time that you may need off of the medication may also vary with the medication and the reason for which you were taking it.  If you are taking any of these medications, please make sure you notify your pain physician before you undergo any procedures.         Radiofrequency Lesioning Radiofrequency  lesioning is a procedure that is performed to relieve pain. The procedure is often used for back, neck, or arm pain. Radiofrequency lesioning involves the use of a machine that creates radio waves to make heat. During the procedure, the heat is applied to the nerve that carries the pain signal. The heat damages the nerve and interferes with the pain signal. Pain relief usually lasts for 6 months to 1 year. LET [facility] CARE PROVIDER KNOW ABOUT:  Any allergies you have.  All medicines you are taking, including vitamins, herbs, eye drops, creams, and over-the-counter medicines.  Previous problems you or members of your family have had with the use of anesthetics.  Any blood disorders you have.  Previous surgeries you have had.  Any medical conditions you have.  Whether you are pregnant or may be pregnant. RISKS AND COMPLICATIONS Generally, this is a safe procedure. However, problems may occur, including:  Pain or soreness at the injection site.  Infection at the injection site.  Damage to nerves or blood vessels. BEFORE THE  PROCEDURE  Ask your health care provider about:  Changing or stopping your regular medicines. This is especially important if you are taking diabetes medicines or blood thinners.  Taking medicines such as aspirin and ibuprofen. These medicines can thin your blood. Do not take these medicines before your procedure if your health care provider instructs you not to.  Follow instructions from your health care provider about eating or drinking restrictions.  Plan to have someone take you home after the procedure.  If you go home right after the procedure, plan to have someone with you for 24 hours. PROCEDURE  You will be given one or more of the following:  A medicine to help you relax (sedative).  A medicine to numb the area (local anesthetic).  You will be awake during the procedure. You will need to be able to talk with the health care provider during the procedure.  With the help of a type of X-ray (fluoroscopy), the health care provider will insert a radiofrequency needle into the area to be treated.  Next, a wire that carries the radio waves (electrode) will be put through the radiofrequency needle. An electrical pulse will be sent through the electrode to verify the correct nerve. You will feel a tingling sensation, and you may have muscle twitching.  Then, the tissue that is around the needle tip will be heated by an electric current that is passed using the radiofrequency machine. This will numb the nerves.  A bandage (dressing) will be put on the insertion area after the procedure is done. The procedure may vary among health care providers and hospitals. AFTER THE PROCEDURE  Your blood pressure, heart rate, breathing rate, and blood oxygen level will be monitored often until the medicines you were given have worn off.  Return to your normal activities as directed by your health care provider.   This information is not intended to replace advice given to you by your health  care provider. Make sure you discuss any questions you have with your health care provider.   Document Released: 01/13/2011 Document Revised: 02/05/2015 Document Reviewed: 06/24/2014 Elsevier Interactive Patient Education 2016 [place] may apply ice for the first 24 hours, 15 minutes on, 15 minutes off. You may apply heat after today.

## 2015-09-09 ENCOUNTER — Telehealth: Payer: Self-pay | Admitting: *Deleted

## 2015-09-09 ENCOUNTER — Other Ambulatory Visit: Payer: Self-pay | Admitting: Pain Medicine

## 2015-09-09 NOTE — Telephone Encounter (Signed)
Spoke with patient verbalizes no complications or concerns re; procedure on yesterday.

## 2015-09-24 ENCOUNTER — Encounter: Payer: Self-pay | Admitting: Pain Medicine

## 2015-09-24 ENCOUNTER — Ambulatory Visit: Payer: BC Managed Care – PPO | Attending: Pain Medicine | Admitting: Pain Medicine

## 2015-09-24 VITALS — BP 129/74 | HR 85 | Temp 97.8°F | Resp 16 | Ht 66.0 in | Wt 170.0 lb

## 2015-09-24 DIAGNOSIS — M533 Sacrococcygeal disorders, not elsewhere classified: Secondary | ICD-10-CM | POA: Diagnosis not present

## 2015-09-24 DIAGNOSIS — M47816 Spondylosis without myelopathy or radiculopathy, lumbar region: Secondary | ICD-10-CM

## 2015-09-24 DIAGNOSIS — M79606 Pain in leg, unspecified: Secondary | ICD-10-CM | POA: Diagnosis present

## 2015-09-24 DIAGNOSIS — M5136 Other intervertebral disc degeneration, lumbar region: Secondary | ICD-10-CM | POA: Diagnosis not present

## 2015-09-24 DIAGNOSIS — M545 Low back pain: Secondary | ICD-10-CM | POA: Diagnosis present

## 2015-09-24 DIAGNOSIS — M5126 Other intervertebral disc displacement, lumbar region: Secondary | ICD-10-CM | POA: Insufficient documentation

## 2015-09-24 DIAGNOSIS — M546 Pain in thoracic spine: Secondary | ICD-10-CM | POA: Diagnosis present

## 2015-09-24 MED ORDER — DULOXETINE HCL 20 MG PO CPEP: ORAL_CAPSULE | ORAL | Status: DC

## 2015-09-24 MED ORDER — CYCLOBENZAPRINE HCL 10 MG PO TABS: ORAL_TABLET | ORAL | Status: DC

## 2015-09-24 MED ORDER — HYDROCODONE-ACETAMINOPHEN 5-325 MG PO TABS: ORAL_TABLET | ORAL | Status: DC

## 2015-09-24 NOTE — Progress Notes (Signed)
Patient here for medication management Safety precautions to be maintained throughout the outpatient stay will include: orient to surroundings, keep bed in low position, maintain call bell within reach at all times, provide assistance with transfer out of bed and ambulation.  

## 2015-09-24 NOTE — Progress Notes (Signed)
Subjective:    Patient ID: Katherine Lester, female    DOB: 09-Nov-1955, 60 y.o.   MRN: [id number]  HPI  The patient is a 60 year old female who returns to pain management for further evaluation and treatment of pain involving the mid lower back and lower extremity region with pain of the lower back region predominantly. The patient's pain is aggravated by twisting and turning maneuvers with pain radiating to the region of the buttocks as well. After further evaluation of patient patient was felt to be with significant component of pain due to sacroiliac joint involvement. The patient was with reproduction of severe pain with palpation over the PSIS and PII S regions and appeared to be with increased pain with Patrick's maneuver. There was also tenderness over the lumbar facet lumbar paraspinal musculature region of moderately severe degree. We reviewed patient's medications. The patient continues Cymbalta Flexeril and  hydrocodone acetaminophen . The patient's predominant pain is aggravated by twisting turning maneuvers. We have requested insurance approval for patient to be approved for radiofrequency rhizolysis lumbar facet, medial branch nerves, and will proceed with lumbar facet, medial branch nerve blocks at time return appointment. The patient has had significant benefit with lumbar facet, medial branch nerve blocks. We will continue to request insurance approval for radiofrequency rhizolysis lumbar facet medial branch nerves as discussed. All agreed to suggested treatment plan.      Review of Systems     Objective:   Physical Exam  There was tenderness to palpation of the splenius capitis and occipitalis musculature regions of moderate degree with mild degree with mild tenderness of the cervical facet cervical paraspinal musculature region. Palpation over the thoracic facet thoracic paraspinal musculature region was with mild tenderness to palpation as well with no crepitus of the  thoracic region noted. Palpation of the acromioclavicular and glenohumeral joint regions reproduced pain of mild degree. The patient was at unremarkable drop test. Tinel and Phalen's maneuver were without increase of pain of significant degree and patient was with bilaterally equal grip strength. Palpation over the thoracic region in the lower thoracic region was attends to palpation of moderate degree with moderate muscle spasms with palpation over the lumbar paraspinal musculature region lumbar facet region reproducing moderate to moderately severe pain. Lateral bending rotation extension and palpation of the lumbar facets reproduced severely disabling pain. Palpation over the PSIS and PII S region associated with significant increase of pain is well with mild to moderate tenderness of the greater trochanteric region iliotibial band region. Straight leg raise was tolerates approximately 30 without an increase of pain with dorsiflexion with negative clonus negative Homans and with no definite sensory deficit or dermatomal dystrophy detected. 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 We will also inject medication in the region of the sacroiliac joint as discussed  F/U PCP Dr. Lennox Grumbles for evaliation of  BP and general medical  condition  F/U surgical evaluation We will avoid surgical evaluation at this time  Physical therapy as prescribed to meet  criteria of insurance company to be approved for radiofrequency rhizolysis lumbar facet,  medial branch nerves as previously discussed  F/U neurological evaluation We will avoid PNCV/EMG studies and other studies at this time  May consider radiofrequency rhizolysis or intraspinal procedures pending response to present treatmenPat and F/U evaluation   Patient to call [facility] Management Center should patient have concerns prior to scheduled return appointment.

## 2015-09-24 NOTE — Patient Instructions (Addendum)
PLAN   Continue present medications  Flexeril Cymbalta and hydrocodone acetaminophen  Lumbar facet, medial branch nerve, blocks to be performed at time of return appointment. We will also inject medication in the region of the sacroiliac joint as discussed  F/U PCP Dr. Lennox Grumbles for evaliation of  BP and general medical  condition  F/U surgical evaluation We will avoid surgical evaluation at this time  Physical therapy as prescribed to meet criteria of insurance company to be approved for radiofrequency rhizolysis lumbar facet, medial branch nerves as previously discussed  F/U neurological evaluation We will avoid PNCV/EMG studies and other studies at this time  May consider radiofrequency rhizolysis or intraspinal procedures pending response to present treatmenPat and F/U evaluation   Patient to call [facility] Center should patient have concerns prior to scheduled return appointment.Sacroiliac (SI) Joint Injection Patient Information  Description: The sacroiliac joint connects the scrum (very low back and tailbone) to the ilium (a pelvic bone which also forms half of the hip joint).  Normally this joint experiences very little motion.  When this joint becomes inflamed or unstable low back and or hip and pelvis pain may result.  Injection of this joint with local anesthetics (numbing medicines) and steroids can provide diagnostic information and reduce pain.  This injection is performed with the aid of x-ray guidance into the tailbone area while you are lying on your stomach.   You may experience an electrical sensation down the leg while this is being done.  You may also experience numbness.  We also may ask if we are reproducing your normal pain during the injection.  Conditions which may be treated SI injection:   Low back, buttock, hip or leg pain  Preparation for the Injection:  1. Do not eat any solid food or dairy products within 8 hours of your appointment.  2. You may drink  clear liquids up to 3 hours before appointment.  Clear liquids include water, black coffee, juice or soda.  No milk or cream please. 3. You may take your regular medications, including pain medications with a sip of water before your appointment.  Diabetics should hold regular insulin (if take separately) and take 1/2 normal NPH dose the morning of the procedure.  Carry some sugar containing items with you to your appointment. 4. A driver must accompany you and be prepared to drive you home after your procedure. 5. Bring all of your current medications with you. 6. An IV may be inserted and sedation may be given at the discretion of the physician. 7. A blood pressure cuff, EKG and other monitors will often be applied during the procedure.  Some patients may need to have extra oxygen administered for a short period.  8. You will be asked to provide medical information, including your allergies, prior to the procedure.  We must know immediately if you are taking blood thinners (like Coumadin/Warfarin) or if you are allergic to IV iodine contrast (dye).  We must know if you could possible be pregnant.  Possible side effects:   Bleeding from needle site  Infection (rare, may require surgery)  Nerve injury (rare)  Numbness & tingling (temporary)  A brief convulsion or seizure  Light-headedness (temporary)  Pain at injection site (several days)  Decreased blood pressure (temporary)  Weakness in the leg (temporary)   Call if you experience:   New onset weakness or numbness of an extremity below the injection site that last more than 8 hours.  Hives or difficulty breathing (  go to the emergency room)  Inflammation or drainage at the injection site  Any new symptoms which are concerning to you  Please note:  Although the local anesthetic injected can often make your back/ hip/ buttock/ leg feel good for several hours after the injections, the pain will likely return.  It takes 3-7  days for steroids to work in the sacroiliac area.  You may not notice any pain relief for at least that one week.  If effective, we will often do a series of three injections spaced 3-6 weeks apart to maximally decrease your pain.  After the initial series, we generally will wait some months before a repeat injection of the same type.  If you have any questions, please call [phone] [place]  What are the risk, side effects and possible complications? Generally speaking, most procedures are safe.  However, with any procedure there are risks, side effects, and the possibility of complications.  The risks and complications are dependent upon the sites that are lesioned, or the type of nerve block to be performed.  The closer the procedure is to the spine, the more serious the risks are.  Great care is taken when placing the radio frequency needles, block needles or lesioning probes, but sometimes complications can occur. 1. Infection: Any time there is an injection through the skin, there is a risk of infection.  This is why sterile conditions are used for these blocks.  There are four possible types of infection. 1. Localized skin infection. 2. Central Nervous System Infection-This can be in the form of Meningitis, which can be deadly. 3. Epidural Infections-This can be in the form of an epidural abscess, which can cause pressure inside of the spine, causing compression of the spinal cord with subsequent paralysis. This would require an emergency surgery to decompress, and there are no guarantees that the patient would recover from the paralysis. 4. Discitis-This is an infection of the intervertebral discs.  It occurs in about 1% of discography procedures.  It is difficult to treat and it may lead to surgery.        2. Pain: the needles have to go through skin and soft tissues, will cause soreness.       3. Damage to internal  structures:  The nerves to be lesioned may be near blood vessels or    other nerves which can be potentially damaged.       4. Bleeding: Bleeding is more common if the patient is taking blood thinners such as  aspirin, Coumadin, Ticiid, Plavix, etc., or if he/she have some genetic predisposition  such as hemophilia. Bleeding into the spinal canal can cause compression of the spinal  cord with subsequent paralysis.  This would require an emergency surgery to  decompress and there are no guarantees that the patient would recover from the  paralysis.       5. Pneumothorax:  Puncturing of a lung is a possibility, every time a needle is introduced in  the area of the chest or upper back.  Pneumothorax refers to free air around the  collapsed lung(s), inside of the thoracic cavity (chest cavity).  Another two possible  complications related to a similar event would include: Hemothorax and Chylothorax.   These are variations of the Pneumothorax, where instead of air around the collapsed  lung(s), you may have blood or chyle, respectively.       6. Spinal headaches: They  may occur with any procedures in the area of the spine.       7. Persistent CSF (Cerebro-Spinal Fluid) leakage: This is a rare problem, but may occur  with prolonged intrathecal or epidural catheters either due to the formation of a fistulous  track or a dural tear.       8. Nerve damage: By working so close to the spinal cord, there is always a possibility of  nerve damage, which could be as serious as a permanent spinal cord injury with  paralysis.       9. Death:  Although rare, severe deadly allergic reactions known as "Anaphylactic  reaction" can occur to any of the medications used.      10. Worsening of the symptoms:  We can always make thing worse.  What are the chances of something like this happening? Chances of any of this occuring are extremely low.  By statistics, you have more of a chance of getting killed in a motor vehicle  accident: while driving to the hospital than any of the above occurring .  Nevertheless, you should be aware that they are possibilities.  In general, it is similar to taking a shower.  Everybody knows that you can slip, hit your head and get killed.  Does that mean that you should not shower again?  Nevertheless always keep in mind that statistics do not mean anything if you happen to be on the wrong side of them.  Even if a procedure has a 1 (one) in a 1,000,000 (million) chance of going wrong, it you happen to be that one..Also, keep in mind that by statistics, you have more of a chance of having something go wrong when taking medications.  Who should not have this procedure? If you are on a blood thinning medication (e.g. Coumadin, Plavix, see list of "Blood Thinners"), or if you have an active infection going on, you should not have the procedure.  If you are taking any blood thinners, please inform your physician.  How should I prepare for this procedure?  Do not eat or drink anything at least six hours prior to the procedure.  Bring a driver with you .  It cannot be a taxi.  Come accompanied by an adult that can drive you back, and that is strong enough to help you if your legs get weak or numb from the local anesthetic.  Take all of your medicines the morning of the procedure with just enough water to swallow them.  If you have diabetes, make sure that you are scheduled to have your procedure done first thing in the morning, whenever possible.  If you have diabetes, take only half of your insulin dose and notify our nurse that you have done so as soon as you arrive at the clinic.  If you are diabetic, but only take blood sugar pills (oral hypoglycemic), then do not take them on the morning of your procedure.  You may take them after you have had the procedure.  Do not take aspirin or any aspirin-containing medications, at least eleven (11) days prior to the procedure.  They may prolong  bleeding.  Wear loose fitting clothing that may be easy to take off and that you would not mind if it got stained with Betadine or blood.  Do not wear any jewelry or perfume  Remove any nail coloring.  It will interfere with some of our monitoring equipment.  NOTE: Remember that this is not meant to be  interpreted as a complete list of all possible complications.  Unforeseen problems may occur.  BLOOD THINNERS The following drugs contain aspirin or other products, which can cause increased bleeding during surgery and should not be taken for 2 weeks prior to and 1 week after surgery.  If you should need take something for relief of minor pain, you may take acetaminophen which is found in Tylenol,m Datril, Anacin-3 and Panadol. It is not blood thinner. The products listed below are.  Do not take any of the products listed below in addition to any listed on your instruction sheet.  A.P.C or A.P.C with Codeine Codeine Phosphate Capsules #3 Ibuprofen Ridaura  ABC compound Congesprin Imuran rimadil  Advil Cope Indocin Robaxisal  Alka-Seltzer Effervescent Pain Reliever and Antacid Coricidin or Coricidin-D  Indomethacin Rufen  Alka-Seltzer plus Cold Medicine Cosprin Ketoprofen S-A-C Tablets  Anacin Analgesic Tablets or Capsules Coumadin Korlgesic Salflex  Anacin Extra Strength Analgesic tablets or capsules CP-2 Tablets Lanoril Salicylate  Anaprox Cuprimine Capsules Levenox Salocol  Anexsia-D Dalteparin Magan Salsalate  Anodynos Darvon compound Magnesium Salicylate Sine-off  Ansaid Dasin Capsules Magsal Sodium Salicylate  Anturane Depen Capsules Marnal Soma  APF Arthritis pain formula Dewitt's Pills Measurin Stanback  Argesic Dia-Gesic Meclofenamic Sulfinpyrazone  Arthritis Bayer Timed Release Aspirin Diclofenac Meclomen Sulindac  Arthritis pain formula Anacin Dicumarol Medipren Supac  Analgesic (Safety coated) Arthralgen Diffunasal Mefanamic Suprofen  Arthritis Strength Bufferin Dihydrocodeine  Mepro Compound Suprol  Arthropan liquid Dopirydamole Methcarbomol with Aspirin Synalgos  ASA tablets/Enseals Disalcid Micrainin Tagament  Ascriptin Doan's Midol Talwin  Ascriptin A/D Dolene Mobidin Tanderil  Ascriptin Extra Strength Dolobid Moblgesic Ticlid  Ascriptin with Codeine Doloprin or Doloprin with Codeine Momentum Tolectin  Asperbuf Duoprin Mono-gesic Trendar  Aspergum Duradyne Motrin or Motrin IB Triminicin  Aspirin plain, buffered or enteric coated Durasal Myochrisine Trigesic  Aspirin Suppositories Easprin Nalfon Trillsate  Aspirin with Codeine Ecotrin Regular or Extra Strength Naprosyn Uracel  Atromid-S Efficin Naproxen Ursinus  Auranofin Capsules Elmiron Neocylate Vanquish  Axotal Emagrin Norgesic Verin  Azathioprine Empirin or Empirin with Codeine Normiflo Vitamin E  Azolid Emprazil Nuprin Voltaren  Bayer Aspirin plain, buffered or children's or timed BC Tablets or powders Encaprin Orgaran Warfarin Sodium  Buff-a-Comp Enoxaparin Orudis Zorpin  Buff-a-Comp with Codeine Equegesic Os-Cal-Gesic   Buffaprin Excedrin plain, buffered or Extra Strength Oxalid   Bufferin Arthritis Strength Feldene Oxphenbutazone   Bufferin plain or Extra Strength Feldene Capsules Oxycodone with Aspirin   Bufferin with Codeine Fenoprofen Fenoprofen Pabalate or Pabalate-SF   Buffets II Flogesic Panagesic   Buffinol plain or Extra Strength Florinal or Florinal with Codeine Panwarfarin   Buf-Tabs Flurbiprofen Penicillamine   Butalbital Compound Four-way cold tablets Penicillin   Butazolidin Fragmin Pepto-Bismol   Carbenicillin Geminisyn Percodan   Carna Arthritis Reliever Geopen Persantine   Carprofen Gold's salt Persistin   Chloramphenicol Goody's Phenylbutazone   Chloromycetin Haltrain Piroxlcam   Clmetidine heparin Plaquenil   Cllnoril Hyco-pap Ponstel   Clofibrate Hydroxy chloroquine Propoxyphen         Before stopping any of these medications, be sure to consult the physician who ordered  them.  Some, such as Coumadin (Warfarin) are ordered to prevent or treat serious conditions such as "deep thrombosis", "pumonary embolisms", and other heart problems.  The amount of time that you may need off of the medication may also vary with the medication and the reason for which you were taking it.  If you are taking any of these medications, please make sure you notify your pain  physician before you undergo any procedures.

## 2015-10-15 ENCOUNTER — Ambulatory Visit: Payer: BC Managed Care – PPO | Attending: Pain Medicine | Admitting: Pain Medicine

## 2015-10-15 ENCOUNTER — Encounter: Payer: Self-pay | Admitting: Pain Medicine

## 2015-10-15 VITALS — BP 137/79 | HR 79 | Temp 98.2°F | Resp 16 | Ht 66.0 in | Wt 170.0 lb

## 2015-10-15 DIAGNOSIS — M5126 Other intervertebral disc displacement, lumbar region: Secondary | ICD-10-CM | POA: Insufficient documentation

## 2015-10-15 DIAGNOSIS — M533 Sacrococcygeal disorders, not elsewhere classified: Secondary | ICD-10-CM

## 2015-10-15 DIAGNOSIS — M5136 Other intervertebral disc degeneration, lumbar region: Secondary | ICD-10-CM | POA: Insufficient documentation

## 2015-10-15 DIAGNOSIS — M545 Low back pain: Secondary | ICD-10-CM | POA: Insufficient documentation

## 2015-10-15 DIAGNOSIS — M47816 Spondylosis without myelopathy or radiculopathy, lumbar region: Secondary | ICD-10-CM

## 2015-10-15 DIAGNOSIS — M79606 Pain in leg, unspecified: Secondary | ICD-10-CM | POA: Diagnosis present

## 2015-10-15 MED ORDER — FENTANYL CITRATE (PF) 100 MCG/2ML IJ SOLN
100.0000 ug | Freq: Once | INTRAMUSCULAR | Status: AC
  Administered 2015-10-15: 50 ug via INTRAVENOUS
  Filled 2015-10-15: qty 2

## 2015-10-15 MED ORDER — LACTATED RINGERS IV SOLN
1000.0000 mL | INTRAVENOUS | Status: AC
  Administered 2015-10-15: 1000 mL via INTRAVENOUS

## 2015-10-15 MED ORDER — MIDAZOLAM HCL 5 MG/5ML IJ SOLN
5.0000 mg | Freq: Once | INTRAMUSCULAR | Status: AC
  Administered 2015-10-15: 5 mg via INTRAVENOUS
  Filled 2015-10-15: qty 5

## 2015-10-15 MED ORDER — ORPHENADRINE CITRATE 30 MG/ML IJ SOLN
60.0000 mg | Freq: Once | INTRAMUSCULAR | Status: AC
  Administered 2015-10-15: 60 mg via INTRAMUSCULAR
  Filled 2015-10-15: qty 2

## 2015-10-15 MED ORDER — TRIAMCINOLONE ACETONIDE 40 MG/ML IJ SUSP
40.0000 mg | Freq: Once | INTRAMUSCULAR | Status: AC
  Administered 2015-10-15: 40 mg
  Filled 2015-10-15: qty 1

## 2015-10-15 MED ORDER — BUPIVACAINE HCL (PF) 0.25 % IJ SOLN
30.0000 mL | Freq: Once | INTRAMUSCULAR | Status: AC
  Administered 2015-10-15: 30 mL
  Filled 2015-10-15: qty 30

## 2015-10-15 NOTE — Progress Notes (Signed)
Subjective:    Patient ID: Katherine Lester, female    DOB: 13-Apr-1956, 60 y.o.   MRN: [id number]  HPI  PROCEDURE PERFORMED: Lumbar facet (medial branch block)   NOTE: The patient is a 60 y.o. female who returns to [facility] for further evaluation and treatment of pain involving the lumbar and lower extremity region. 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 We will proceed with interventional treatment as discussed and as explained to the patient who was understanding and wished to proceed with procedure as planned.   DESCRIPTION OF PROCEDURE: Lumbar facet (medial branch block) with IV Versed, IV fentanyl conscious sedation, EKG, blood pressure, pulse, capnography, and pulse oximetry monitoring. The procedure was performed with the patient in the prone position. Betadine prep of proposed entry site performed.   NEEDLE PLACEMENT AT: Left L 2 lumbar facet (medial branch block). Under fluoroscopic guidance with oblique orientation of 15 degrees, a 22-gauge needle was inserted at the L 2 vertebral body level with needle placed at the targeted area of Burton's Eye or Eye of the Scotty Dog with documentation of needle placement in the superior and lateral border of targeted area of Burton's Eye or Eye of the Scotty Dog with oblique orientation of 15 degrees. Following documentation of needle placement at the L 2 vertebral body level, needle placement was then accomplished at the L 3 vertebral body level.   NEEDLE PLACEMENT AT L3,  L4, and L5 VERTEBRAL BODY LEVELS ON THE LEFT SIDE The procedure was performed at the L3, L4, and L5 vertebral body levels exactly as was performed at the L 2 vertebral body level utilizing the same technique and under fluoroscopic guidance.  NEEDLE PLACEMENT AT THE SACRAL ALA with AP view of the lumbosacral spine. With the patient in the prone position, Betadine prep of proposed entry site accomplished, a 22 gauge needle was inserted in the region of the sacral ala (groove formed by the superior articulating process of S1 and the sacral wing). Following documentation of needle placement at the sacral ala,    Needle placement was then verified at all levels on lateral view. Following documentation of needle placement at all levels on lateral view and following negative aspiration for heme and CSF, each level was injected with 1 mL of 0.25% bupivacaine with Kenalog.     LUMBAR FACET, MEDIAL BRANCH NERVE, BLOCKS PERFORMED ON THE RIGHT SIDE   The procedure was performed on the right side exactly as was performed on the left side at the same levels and utilizing the same technique under fluoroscopic guidance.     The patient tolerated the procedure well. A total of 40 mg of Kenalog was utilized for the procedure.   PLAN:  1. Medications: The patient will continue presently prescribed medications Flexeril Cymbalta and hydrocodone acetaminophen 2. May consider modification of treatment regimen at time of return appointment pending response to treatment rendered on today's visit. 3. The patient is to follow-up with primary care physician Dr. Lennox Grumbles for further evaluation of blood pressure and general medical condition  status post steroid injection performed on today's visit. 4. Surgical follow-up evaluation. Has been addressed 5. Neurological follow-up evaluation. May consider PNCV EMG studies and other studies 6. The patient may be candidate for radiofrequency procedures, implantation type  procedures, and other treatment pending response to treatment and follow-up evaluation. 7. The patient has been advised to call the Pain Management Center prior to scheduled return appointment should there be significant change in condition or should patient have other concerns regarding condition prior to scheduled return appointment.  The patient is understanding and in agreement with suggested treatment plan.   Review of Systems     Objective:   Physical Exam        Assessment & Plan:

## 2015-10-15 NOTE — Progress Notes (Signed)
Patient here for procedure d/t lower back pain. Safety precautions to be maintained throughout the outpatient stay will include: orient to surroundings, keep bed in low position, maintain call bell within reach at all times, provide assistance with transfer out of bed and ambulation.  

## 2015-10-15 NOTE — Patient Instructions (Signed)
PLAN   Continue present medications  Flexeril Cymbalta and hydrocodone acetaminophen  F/U PCP Dr. Lennox Grumbles for evaliation of  BP and general medical  condition  F/U surgical evaluation We will avoid surgical evaluation at this time  Physical therapy as prescribed to meet criteria of insurance company to be approved for radiofrequency rhizolysis lumbar facet, medial branch nerves as previously discussed  Ask [facility]  And Angie if you have been approved for radiofrequency lumbar facets  F/U neurological evaluation We will avoid PNCV/EMG studies and other studies at this time  May consider radiofrequency rhizolysis or intraspinal procedures pending response to present treatment and follow-up evaluations  Patient to call [facility] Management Center should patient have concerns prior to scheduled return appointmenint

## 2015-10-16 ENCOUNTER — Telehealth: Payer: Self-pay

## 2015-10-16 NOTE — Telephone Encounter (Signed)
Denies any needs other then having hot flashes from medication

## 2015-10-30 ENCOUNTER — Other Ambulatory Visit: Payer: Self-pay | Admitting: Pain Medicine

## 2015-10-30 ENCOUNTER — Telehealth: Payer: Self-pay

## 2015-10-30 DIAGNOSIS — M533 Sacrococcygeal disorders, not elsewhere classified: Secondary | ICD-10-CM

## 2015-10-30 DIAGNOSIS — M5136 Other intervertebral disc degeneration, lumbar region: Secondary | ICD-10-CM

## 2015-10-30 DIAGNOSIS — M47816 Spondylosis without myelopathy or radiculopathy, lumbar region: Secondary | ICD-10-CM

## 2015-10-30 NOTE — Telephone Encounter (Signed)
Pt wants bursa shot in both of her legs the day she comes in for her appt on Tuesday 6/13. Pt wants to know if this is something she can get when she comes in

## 2015-10-30 NOTE — Telephone Encounter (Signed)
Thank you very much Please let patient know that she would need to come on Monday which is a procedure day if she wishes to have injections for greater trochanteric bursitis  Again Thank you

## 2015-10-30 NOTE — Telephone Encounter (Signed)
Katherine Lester, and Nurses Please schedule patient for Monday, 11/10/2015 if she wishes to have injections for bursitis. I can refill medications on Monday, 11/10/2015 as well

## 2015-11-11 ENCOUNTER — Encounter: Payer: Self-pay | Admitting: Pain Medicine

## 2015-11-11 ENCOUNTER — Ambulatory Visit: Payer: BC Managed Care – PPO | Attending: Pain Medicine | Admitting: Pain Medicine

## 2015-11-11 VITALS — BP 113/68 | HR 79 | Temp 97.7°F | Resp 16 | Ht 66.0 in | Wt 170.0 lb

## 2015-11-11 DIAGNOSIS — M25552 Pain in left hip: Secondary | ICD-10-CM | POA: Insufficient documentation

## 2015-11-11 DIAGNOSIS — M25551 Pain in right hip: Secondary | ICD-10-CM | POA: Diagnosis present

## 2015-11-11 DIAGNOSIS — M7071 Other bursitis of hip, right hip: Secondary | ICD-10-CM

## 2015-11-11 DIAGNOSIS — M533 Sacrococcygeal disorders, not elsewhere classified: Secondary | ICD-10-CM

## 2015-11-11 DIAGNOSIS — M47816 Spondylosis without myelopathy or radiculopathy, lumbar region: Secondary | ICD-10-CM

## 2015-11-11 DIAGNOSIS — M5136 Other intervertebral disc degeneration, lumbar region: Secondary | ICD-10-CM

## 2015-11-11 DIAGNOSIS — M7072 Other bursitis of hip, left hip: Secondary | ICD-10-CM

## 2015-11-11 MED ORDER — HYDROCODONE-ACETAMINOPHEN 5-325 MG PO TABS: ORAL_TABLET | ORAL | Status: DC

## 2015-11-11 MED ORDER — TRIAMCINOLONE ACETONIDE 40 MG/ML IJ SUSP: 40.0000 mg | Freq: Once | INTRAMUSCULAR | Status: AC

## 2015-11-11 MED ORDER — SODIUM CHLORIDE 0.9% FLUSH: 20.0000 mL | Freq: Once | INTRAVENOUS | Status: AC

## 2015-11-11 MED ORDER — TRIAMCINOLONE ACETONIDE 40 MG/ML IJ SUSP
INTRAMUSCULAR | Status: AC
  Administered 2015-11-11: 40 mg
  Filled 2015-11-11: qty 2

## 2015-11-11 MED ORDER — DULOXETINE HCL 20 MG PO CPEP: ORAL_CAPSULE | ORAL | Status: DC

## 2015-11-11 MED ORDER — SODIUM CHLORIDE 0.9 % IJ SOLN
INTRAMUSCULAR | Status: AC
  Administered 2015-11-11: 20 mL
  Filled 2015-11-11: qty 20

## 2015-11-11 MED ORDER — CYCLOBENZAPRINE HCL 10 MG PO TABS: ORAL_TABLET | ORAL | Status: DC

## 2015-11-11 NOTE — Patient Instructions (Signed)
PLAN   Continue present medications  Flexeril Cymbalta and hydrocodone acetaminophen  F/U PCP Dr. Lennox Grumbles for evaliation of  BP and general medical  condition  F/U surgical evaluation We will avoid surgical evaluation at this time  Physical therapy as prescribed to meet criteria of insurance company to be approved for radiofrequency rhizolysis lumbar facet, medial branch nerves as previously discussed  F/U neurological evaluation We will avoid PNCV/EMG studies and other studies at this time  May consider radiofrequency rhizolysis or intraspinal procedures pending response to present treatmenPat and F/U evaluation   Patient to call Pain Management Center should patient have concerns prior to scheduled return appointment

## 2015-11-11 NOTE — Progress Notes (Signed)
   Subjective:    Patient ID: Katherine Lester, female    DOB: [birth year], 57 y.o.   MRN: [id number]  HPI                                                                                            LEFT AND RIGHT GREATER TROCHANTERIC BURSA  INJECTIONS     The patient is a 58  -year-old female who returns to pain management for further evaluation and treatment of pain involving the hips andgreater trochanteric regions. The patient is with  severely disabling pain reproduced  by palpation of  the greater  trochanteric region predominantly.  There is concern regarding the patient's pain being due to a significant component of greater trochanteric bursitis and iliotibial band syndrome. The risk, benefits, and expectations of the procedure were discussed with and explained to the patient who was with understanding and in agreement with the suggested treatment plan.       Description Of Procedure:  Left Greater Trochanteric Bursa Injection  The patient was positioned in the lateral decubitus position.  EKG, blood pressure, pulse, and pulse oximetry monitors were all in place. Identification of landmarks for needle entry for the procedure was accomplished and Betadine prep of the proposed needle entry site was accomplished.  A 22-gauge needle was inserted and 5 cc of 0.25% bupivacaine with Kenalog was injected. The needle was removed. The needle was reinserted in the region of the greater trochanter and in additional 5 cc of 0.25% bupivacaine with Kenalog was injected for left greater trochanteric bursa injection. The needle was removed.  Description Of Procedure: Right Greater Trochanteric Bursa Injection  The procedure was performed on the right side exactly as was performed on the left side utilizing the same technique.    The patient tolerated the procedure well  A total of 40 milligrams of Kenalog was utilized for the procedure     PLAN  Continue present medications  Cymbalta,  Flexeril and hydrocodone acetaminophen  F/U PCP for evaliation of  BP and general medical  condition  F/U surgical evaluation. May consider pending follow-up evaluations  F/U neurological evaluation. May consider pending follow-up evaluations  May consider radiofrequency rhizolysis procedures pending response to present treatment and F/U evaluation  We are awaiting insurance approval  Patient to call Pain Management Center should patient have concerns prior to scheduled return appointment.    Review of Systems     Objective:   Physical Exam        Assessment & Plan:

## 2015-11-11 NOTE — Progress Notes (Signed)
Safety precautions to be maintained throughout the outpatient stay will include: orient to surroundings, keep bed in low position, maintain call bell within reach at all times, provide assistance with transfer out of bed and ambulation.  

## 2015-12-11 ENCOUNTER — Encounter: Payer: Self-pay | Admitting: Pain Medicine

## 2015-12-11 ENCOUNTER — Ambulatory Visit: Payer: BC Managed Care – PPO | Attending: Pain Medicine | Admitting: Pain Medicine

## 2015-12-11 VITALS — BP 121/82 | HR 79 | Temp 98.2°F | Resp 16 | Ht 66.0 in | Wt 170.0 lb

## 2015-12-11 DIAGNOSIS — M47816 Spondylosis without myelopathy or radiculopathy, lumbar region: Secondary | ICD-10-CM

## 2015-12-11 DIAGNOSIS — M533 Sacrococcygeal disorders, not elsewhere classified: Secondary | ICD-10-CM

## 2015-12-11 DIAGNOSIS — M5136 Other intervertebral disc degeneration, lumbar region: Secondary | ICD-10-CM | POA: Diagnosis not present

## 2015-12-11 DIAGNOSIS — M7072 Other bursitis of hip, left hip: Secondary | ICD-10-CM

## 2015-12-11 DIAGNOSIS — M545 Low back pain: Secondary | ICD-10-CM | POA: Diagnosis present

## 2015-12-11 DIAGNOSIS — M51369 Other intervertebral disc degeneration, lumbar region without mention of lumbar back pain or lower extremity pain: Secondary | ICD-10-CM

## 2015-12-11 DIAGNOSIS — M5126 Other intervertebral disc displacement, lumbar region: Secondary | ICD-10-CM | POA: Diagnosis not present

## 2015-12-11 DIAGNOSIS — M7071 Other bursitis of hip, right hip: Secondary | ICD-10-CM

## 2015-12-11 MED ORDER — DULOXETINE HCL 20 MG PO CPEP: ORAL_CAPSULE | ORAL | Status: DC

## 2015-12-11 MED ORDER — CYCLOBENZAPRINE HCL 10 MG PO TABS: ORAL_TABLET | ORAL | Status: DC

## 2015-12-11 MED ORDER — HYDROCODONE-ACETAMINOPHEN 5-325 MG PO TABS: ORAL_TABLET | ORAL | Status: DC

## 2015-12-11 NOTE — Progress Notes (Signed)
Patient here today for medication management. Safety precautions to be maintained throughout the outpatient stay will include: orient to surroundings, keep bed in low position, maintain call bell within reach at all times, provide assistance with transfer out of bed and ambulation.

## 2015-12-11 NOTE — Progress Notes (Signed)
   Subjective:    Patient ID: Katherine Lester, female    DOB: 01/26/1956, 60 y.o.   MRN: [id number]  HPI  The patient is a 60 year old female who returns to pain management for further evaluation and treatment of pain involving the lumbar lower extremity region. The patient stated that she recently went to be in the she was without severely disabling pain. The patient states lumbar facet, medial branch nerve, blocks provide excellent relief of pain. We discussed patient's condition. Patient will attempted to exercise according to avoid aggravation of symptomatology and we will also request once again the patient be approval for lumbar facet medial branch nerve radiofrequency rhizolysis. At the present time we will continue Flexeril Cymbalta and hydrocodone acetaminophen. We will schedule patient for lumbar facet, medial branch nerve, blocks to be performed at time return appointment. All agreed to suggested treatment plan  Review of Systems     Objective:   Physical Exam  There was tenderness of the splenius capitis and occipitalis region palpation which reproduces pain of mild-to-moderate degree with mild to moderate tenderness of the acromioclavicular and glenohumeral joint region. Patient appeared to be with unremarkable Spurling's maneuver and was able to perform drop test without significant difficulty. The patient appeared to be with bilaterally equal grip strength. Palpation over the thoracic region was with no crepitus of the thoracic region noted. Palpation over the lumbar paraspinal muscular treat and lumbar facet region was with moderate tenderness to palpation with lateral bending rotation extension and palpation of the lumbar facets reproducing moderate discomfort. There was tenderness over the region of the PSIS and PII S region a moderate degree. Straight leg raising was tolerated to 30 without increased pain with dorsiflexion noted. DTRs trace at the knees and no sensory deficit or  dermatomal distribution detected. There was negative clonus negative Homans. 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 Lennox Grumbles for evaliation of  BP and general medical  condition  F/U surgical evaluation We will avoid surgical evaluation at this time  Physical therapy as prescribed to meet criteria of insurance company to be approved for radiofrequency rhizolysis lumbar facet, medial branch nerves as previously discussed  F/U neurological evaluation We will avoid PNCV/EMG studies and other studies at this time  May consider radiofrequency rhizolysis or intraspinal procedures pending response to present treatmenPat and F/U evaluation   Patient to call [facility] Management Center should patient have concerns prior to scheduled return appointment

## 2015-12-11 NOTE — Patient Instructions (Addendum)
PLAN   Continue present medications  Flexeril Cymbalta and hydrocodone acetaminophen  Lumbar facet, medial branch nerve, blocks to be performed at time of return appointment  F/U PCP Dr. Lennox Grumbles for evaliation of  BP and general medical  condition  F/U surgical evaluation We will avoid surgical evaluation at this time  Physical therapy as prescribed to meet criteria of insurance company to be approved for radiofrequency rhizolysis lumbar facet, medial branch nerves as previously discussed  F/U neurological evaluation We will avoid PNCV/EMG studies and other studies at this time  May consider radiofrequency rhizolysis or intraspinal procedures pending response to present treatmenPat and F/U evaluation   Patient to call Pain Management Center should patient have concerns prior to scheduled return appointmentGENERAL RISKS AND COMPLICATIONS  What are the risk, side effects and possible complications? Generally speaking, most procedures are safe.  However, with any procedure there are risks, side effects, and the possibility of complications.  The risks and complications are dependent upon the sites that are lesioned, or the type of nerve block to be performed.  The closer the procedure is to the spine, the more serious the risks are.  Great care is taken when placing the radio frequency needles, block needles or lesioning probes, but sometimes complications can occur. 1. Infection: Any time there is an injection through the skin, there is a risk of infection.  This is why sterile conditions are used for these blocks.  There are four possible types of infection. 1. Localized skin infection. 2. Central Nervous System Infection-This can be in the form of Meningitis, which can be deadly. 3. Epidural Infections-This can be in the form of an epidural abscess, which can cause pressure inside of the spine, causing compression of the spinal cord with subsequent paralysis. This would require an emergency  surgery to decompress, and there are no guarantees that the patient would recover from the paralysis. 4. Discitis-This is an infection of the intervertebral discs.  It occurs in about 1% of discography procedures.  It is difficult to treat and it may lead to surgery.        2. Pain: the needles have to go through skin and soft tissues, will cause soreness.       3. Damage to internal structures:  The nerves to be lesioned may be near blood vessels or    other nerves which can be potentially damaged.       4. Bleeding: Bleeding is more common if the patient is taking blood thinners such as  aspirin, Coumadin, Ticiid, Plavix, etc., or if he/she have some genetic predisposition  such as hemophilia. Bleeding into the spinal canal can cause compression of the spinal  cord with subsequent paralysis.  This would require an emergency surgery to  decompress and there are no guarantees that the patient would recover from the  paralysis.       5. Pneumothorax:  Puncturing of a lung is a possibility, every time a needle is introduced in  the area of the chest or upper back.  Pneumothorax refers to free air around the  collapsed lung(s), inside of the thoracic cavity (chest cavity).  Another two possible  complications related to a similar event would include: Hemothorax and Chylothorax.   These are variations of the Pneumothorax, where instead of air around the collapsed  lung(s), you may have blood or chyle, respectively.       6. Spinal headaches: They may occur with any procedures in the area of the spine.  7. Persistent CSF (Cerebro-Spinal Fluid) leakage: This is a rare problem, but may occur  with prolonged intrathecal or epidural catheters either due to the formation of a fistulous  track or a dural tear.       8. Nerve damage: By working so close to the spinal cord, there is always a possibility of  nerve damage, which could be as serious as a permanent spinal cord injury with  paralysis.       9. Death:   Although rare, severe deadly allergic reactions known as "Anaphylactic  reaction" can occur to any of the medications used.      10. Worsening of the symptoms:  We can always make thing worse.  What are the chances of something like this happening? Chances of any of this occuring are extremely low.  By statistics, you have more of a chance of getting killed in a motor vehicle accident: while driving to the hospital than any of the above occurring .  Nevertheless, you should be aware that they are possibilities.  In general, it is similar to taking a shower.  Everybody knows that you can slip, hit your head and get killed.  Does that mean that you should not shower again?  Nevertheless always keep in mind that statistics do not mean anything if you happen to be on the wrong side of them.  Even if a procedure has a 1 (one) in a 1,000,000 (million) chance of going wrong, it you happen to be that one..Also, keep in mind that by statistics, you have more of a chance of having something go wrong when taking medications.  Who should not have this procedure? If you are on a blood thinning medication (e.g. Coumadin, Plavix, see list of "Blood Thinners"), or if you have an active infection going on, you should not have the procedure.  If you are taking any blood thinners, please inform your physician.  How should I prepare for this procedure?  Do not eat or drink anything at least six hours prior to the procedure.  Bring a driver with you .  It cannot be a taxi.  Come accompanied by an adult that can drive you back, and that is strong enough to help you if your legs get weak or numb from the local anesthetic.  Take all of your medicines the morning of the procedure with just enough water to swallow them.  If you have diabetes, make sure that you are scheduled to have your procedure done first thing in the morning, whenever possible.  If you have diabetes, take only half of your insulin dose and notify our  nurse that you have done so as soon as you arrive at the clinic.  If you are diabetic, but only take blood sugar pills (oral hypoglycemic), then do not take them on the morning of your procedure.  You may take them after you have had the procedure.  Do not take aspirin or any aspirin-containing medications, at least eleven (11) days prior to the procedure.  They may prolong bleeding.  Wear loose fitting clothing that may be easy to take off and that you would not mind if it got stained with Betadine or blood.  Do not wear any jewelry or perfume  Remove any nail coloring.  It will interfere with some of our monitoring equipment.  NOTE: Remember that this is not meant to be interpreted as a complete list of all possible complications.  Unforeseen problems may occur.  BLOOD THINNERS  The following drugs contain aspirin or other products, which can cause increased bleeding during surgery and should not be taken for 2 weeks prior to and 1 week after surgery.  If you should need take something for relief of minor pain, you may take acetaminophen which is found in Tylenol,m Datril, Anacin-3 and Panadol. It is not blood thinner. The products listed below are.  Do not take any of the products listed below in addition to any listed on your instruction sheet.  A.P.C or A.P.C with Codeine Codeine Phosphate Capsules #3 Ibuprofen Ridaura  ABC compound Congesprin Imuran rimadil  Advil Cope Indocin Robaxisal  Alka-Seltzer Effervescent Pain Reliever and Antacid Coricidin or Coricidin-D  Indomethacin Rufen  Alka-Seltzer plus Cold Medicine Cosprin Ketoprofen S-A-C Tablets  Anacin Analgesic Tablets or Capsules Coumadin Korlgesic Salflex  Anacin Extra Strength Analgesic tablets or capsules CP-2 Tablets Lanoril Salicylate  Anaprox Cuprimine Capsules Levenox Salocol  Anexsia-D Dalteparin Magan Salsalate  Anodynos Darvon compound Magnesium Salicylate Sine-off  Ansaid Dasin Capsules Magsal Sodium Salicylate   Anturane Depen Capsules Marnal Soma  APF Arthritis pain formula Dewitt's Pills Measurin Stanback  Argesic Dia-Gesic Meclofenamic Sulfinpyrazone  Arthritis Bayer Timed Release Aspirin Diclofenac Meclomen Sulindac  Arthritis pain formula Anacin Dicumarol Medipren Supac  Analgesic (Safety coated) Arthralgen Diffunasal Mefanamic Suprofen  Arthritis Strength Bufferin Dihydrocodeine Mepro Compound Suprol  Arthropan liquid Dopirydamole Methcarbomol with Aspirin Synalgos  ASA tablets/Enseals Disalcid Micrainin Tagament  Ascriptin Doan's Midol Talwin  Ascriptin A/D Dolene Mobidin Tanderil  Ascriptin Extra Strength Dolobid Moblgesic Ticlid  Ascriptin with Codeine Doloprin or Doloprin with Codeine Momentum Tolectin  Asperbuf Duoprin Mono-gesic Trendar  Aspergum Duradyne Motrin or Motrin IB Triminicin  Aspirin plain, buffered or enteric coated Durasal Myochrisine Trigesic  Aspirin Suppositories Easprin Nalfon Trillsate  Aspirin with Codeine Ecotrin Regular or Extra Strength Naprosyn Uracel  Atromid-S Efficin Naproxen Ursinus  Auranofin Capsules Elmiron Neocylate Vanquish  Axotal Emagrin Norgesic Verin  Azathioprine Empirin or Empirin with Codeine Normiflo Vitamin E  Azolid Emprazil Nuprin Voltaren  Bayer Aspirin plain, buffered or children's or timed BC Tablets or powders Encaprin Orgaran Warfarin Sodium  Buff-a-Comp Enoxaparin Orudis Zorpin  Buff-a-Comp with Codeine Equegesic Os-Cal-Gesic   Buffaprin Excedrin plain, buffered or Extra Strength Oxalid   Bufferin Arthritis Strength Feldene Oxphenbutazone   Bufferin plain or Extra Strength Feldene Capsules Oxycodone with Aspirin   Bufferin with Codeine Fenoprofen Fenoprofen Pabalate or Pabalate-SF   Buffets II Flogesic Panagesic   Buffinol plain or Extra Strength Florinal or Florinal with Codeine Panwarfarin   Buf-Tabs Flurbiprofen Penicillamine   Butalbital Compound Four-way cold tablets Penicillin   Butazolidin Fragmin Pepto-Bismol    Carbenicillin Geminisyn Percodan   Carna Arthritis Reliever Geopen Persantine   Carprofen Gold's salt Persistin   Chloramphenicol Goody's Phenylbutazone   Chloromycetin Haltrain Piroxlcam   Clmetidine heparin Plaquenil   Cllnoril Hyco-pap Ponstel   Clofibrate Hydroxy chloroquine Propoxyphen         Before stopping any of these medications, be sure to consult the physician who ordered them.  Some, such as Coumadin (Warfarin) are ordered to prevent or treat serious conditions such as "deep thrombosis", "pumonary embolisms", and other heart problems.  The amount of time that you may need off of the medication may also vary with the medication and the reason for which you were taking it.  If you are taking any of these medications, please make sure you notify your pain physician before you undergo any procedures.         Facet Joint Block  The facet joints connect the bones of the spine (vertebrae). They make it possible for you to bend, twist, and make other movements with your spine. They also prevent you from overbending, overtwisting, and making other excessive movements.  A facet joint block is a procedure where a numbing medicine (anesthetic) is injected into a facet joint. Often, a type of anti-inflammatory medicine called a steroid is also injected. A facet joint block may be done for two reasons:  2. Diagnosis. A facet joint block may be done as a test to see whether neck or back pain is caused by a worn-down or infected facet joint. If the pain gets better after a facet joint block, it means the pain is probably coming from the facet joint. If the pain does not get better, it means the pain is probably not coming from the facet joint.  3. Therapy. A facet joint block may be done to relieve neck or back pain caused by a facet joint. A facet joint block is only done as a therapy if the pain does not improve with medicine, exercise programs, physical therapy, and other forms of pain  management. LET [facility] CARE PROVIDER KNOW ABOUT:   Any allergies you have.   All medicines you are taking, including vitamins, herbs, eyedrops, and over-the-counter medicines and creams.   Previous problems you or members of your family have had with the use of anesthetics.   Any blood disorders you have had.   Other health problems you have. RISKS AND COMPLICATIONS Generally, having a facet joint block is safe. However, as with any procedure, complications can occur. Possible complications associated with having a facet joint block include:   Bleeding.   Injury to a nerve near the injection site.   Pain at the injection site.   Weakness or numbness in areas controlled by nerves near the injection site.   Infection.   Temporary fluid retention.   Allergic reaction to anesthetics or medicines used during the procedure. BEFORE THE PROCEDURE   Follow your health care provider's instructions if you are taking dietary supplements or medicines. You may need to stop taking them or reduce your dosage.   Do not take any new dietary supplements or medicines without asking your health care provider first.   Follow your health care provider's instructions about eating and drinking before the procedure. You may need to stop eating and drinking several hours before the procedure.   Arrange to have an adult drive you home after the procedure. PROCEDURE 12. You may need to remove your clothing and dress in an open-back gown so that your health care provider can access your spine.  13. The procedure will be done while you are lying on an X-ray table. Most of the time you will be asked to lie on your stomach, but you may be asked to lie in a different position if an injection will be made in your neck.  14. Special machines will be used to monitor your oxygen levels, heart rate, and blood pressure.  15. If an injection will be made in your neck, an intravenous (IV) tube  will be inserted into one of your veins. Fluids and medicine will flow directly into your body through the IV tube.  16. The area over the facet joint where the injection will be made will be cleaned with an antiseptic soap. The surrounding skin will be covered with sterile drapes.  17. An anesthetic will be applied to your skin to make  the injection area numb. You may feel a temporary stinging or burning sensation.  18. A video X-ray machine will be used to locate the joint. A contrast dye may be injected into the facet joint area to help with locating the joint.  19. When the joint is located, an anesthetic medicine will be injected into the joint through the needle.  24. Your health care provider will ask you whether you feel pain relief. If you do feel relief, a steroid may be injected to provide pain relief for a longer period of time. If you do not feel relief or feel only partial relief, additional injections of an anesthetic may be made in other facet joints.  21. The needle will be removed, the skin will be cleansed, and bandages will be applied.  AFTER THE PROCEDURE   You will be observed for 15-30 minutes before being allowed to go home. Do not drive. Have an adult drive you or take a taxi or public transportation instead.   If you feel pain relief, the pain will return in several hours or days when the anesthetic wears off.   You may feel pain relief 2-14 days after the procedure. The amount of time this relief lasts varies from person to person.   It is normal to feel some tenderness over the injected area(s) for 2 days following the procedure.   If you have diabetes, you may have a temporary increase in blood sugar.   This information is not intended to replace advice given to you by your health care provider. Make sure you discuss any questions you have with your health care provider.   Document Released: 10/06/2006 Document Revised: 06/07/2014 Document Reviewed:  03/06/2012 Elsevier Interactive Patient Education [employer].

## 2016-01-12 ENCOUNTER — Ambulatory Visit: Payer: BC Managed Care – PPO | Attending: Pain Medicine | Admitting: Pain Medicine

## 2016-01-12 ENCOUNTER — Encounter: Payer: Self-pay | Admitting: Pain Medicine

## 2016-01-12 VITALS — BP 120/62 | HR 71 | Temp 98.8°F | Resp 20 | Ht 66.0 in | Wt 172.0 lb

## 2016-01-12 DIAGNOSIS — M533 Sacrococcygeal disorders, not elsewhere classified: Secondary | ICD-10-CM

## 2016-01-12 DIAGNOSIS — M7060 Trochanteric bursitis, unspecified hip: Secondary | ICD-10-CM

## 2016-01-12 DIAGNOSIS — M7071 Other bursitis of hip, right hip: Secondary | ICD-10-CM

## 2016-01-12 DIAGNOSIS — M5136 Other intervertebral disc degeneration, lumbar region: Secondary | ICD-10-CM | POA: Diagnosis not present

## 2016-01-12 DIAGNOSIS — M79606 Pain in leg, unspecified: Secondary | ICD-10-CM | POA: Diagnosis present

## 2016-01-12 DIAGNOSIS — M545 Low back pain: Secondary | ICD-10-CM | POA: Insufficient documentation

## 2016-01-12 DIAGNOSIS — M5126 Other intervertebral disc displacement, lumbar region: Secondary | ICD-10-CM | POA: Insufficient documentation

## 2016-01-12 DIAGNOSIS — M7072 Other bursitis of hip, left hip: Secondary | ICD-10-CM

## 2016-01-12 DIAGNOSIS — M47816 Spondylosis without myelopathy or radiculopathy, lumbar region: Secondary | ICD-10-CM

## 2016-01-12 MED ORDER — MIDAZOLAM HCL 5 MG/5ML IJ SOLN
5.0000 mg | Freq: Once | INTRAMUSCULAR | Status: AC
  Administered 2016-01-12: 5 mg via INTRAVENOUS
  Filled 2016-01-12: qty 5

## 2016-01-12 MED ORDER — ORPHENADRINE CITRATE 30 MG/ML IJ SOLN
60.0000 mg | Freq: Once | INTRAMUSCULAR | Status: AC
  Administered 2016-01-12: 60 mg via INTRAMUSCULAR
  Filled 2016-01-12: qty 2

## 2016-01-12 MED ORDER — LACTATED RINGERS IV SOLN
1000.0000 mL | INTRAVENOUS | Status: AC
  Administered 2016-01-12: 1000 mL via INTRAVENOUS

## 2016-01-12 MED ORDER — HYDROCODONE-ACETAMINOPHEN 5-325 MG PO TABS: ORAL_TABLET | ORAL | 0 refills | Status: DC

## 2016-01-12 MED ORDER — FENTANYL CITRATE (PF) 100 MCG/2ML IJ SOLN
100.0000 ug | Freq: Once | INTRAMUSCULAR | Status: AC
  Administered 2016-01-12: 100 ug via INTRAVENOUS
  Filled 2016-01-12: qty 2

## 2016-01-12 MED ORDER — DULOXETINE HCL 20 MG PO CPEP: ORAL_CAPSULE | ORAL | 0 refills | Status: DC

## 2016-01-12 MED ORDER — BUPIVACAINE HCL (PF) 0.25 % IJ SOLN
30.0000 mL | Freq: Once | INTRAMUSCULAR | Status: AC
Start: 2016-01-12 — End: 2016-01-12
  Administered 2016-01-12: 30 mL
  Filled 2016-01-12: qty 30

## 2016-01-12 MED ORDER — TRIAMCINOLONE ACETONIDE 40 MG/ML IJ SUSP
40.0000 mg | Freq: Once | INTRAMUSCULAR | Status: AC
  Administered 2016-01-12: 40 mg
  Filled 2016-01-12: qty 1

## 2016-01-12 MED ORDER — CYCLOBENZAPRINE HCL 10 MG PO TABS: ORAL_TABLET | ORAL | 0 refills | Status: DC

## 2016-01-12 NOTE — Progress Notes (Signed)
Patient here for procedure visit d/t lower back pain.  Safety precautions to be maintained throughout the outpatient stay will include: orient to surroundings, keep bed in low position, maintain call bell within reach at all times, provide assistance with transfer out of bed and ambulation.

## 2016-01-12 NOTE — Progress Notes (Signed)
PROCEDURE PERFORMED: Lumbar facet (medial branch block)   NOTE: The patient is a 60 y.o. female who returns to [facility] for further evaluation and treatment of pain involving the lumbar and lower extremity region. 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 The risks, benefits, and expectations of the procedure have been discussed and explained to the patient who was understanding and in agreement with suggested treatment plan. We will proceed with interventional treatment as discussed and as explained to the patient who was understanding and wished to proceed with procedure as planned.   DESCRIPTION OF PROCEDURE: Lumbar facet (medial branch block) with IV Versed, IV fentanyl conscious sedation, EKG, blood pressure, pulse, capnography, and pulse oximetry monitoring. The procedure was performed with the patient in the prone position. Betadine prep of proposed entry site performed.   NEEDLE PLACEMENT AT: Left L 2 lumbar facet (medial branch block). Under fluoroscopic guidance with oblique orientation of 15 degrees, a 22-gauge needle was inserted at the L 2 vertebral body level with needle placed at the targeted area of Burton's Eye or Eye of the Scotty Dog with documentation of needle placement in the superior and lateral border of targeted area of Burton's Eye or Eye of the Scotty Dog with oblique orientation of 15 degrees. Following documentation of needle placement at the L 2 vertebral body level, needle placement was then accomplished at the L 3 vertebral body level.   NEEDLE PLACEMENT AT L3, L4, and L5 VERTEBRAL BODY LEVELS ON THE LEFT SIDE The procedure was performed at the L3, L4, and L5 vertebral body levels exactly as was performed at the L 2 vertebral body level utilizing the same technique  and under fluoroscopic guidance.  NEEDLE PLACEMENT AT THE SACRAL ALA with AP view of the lumbosacral spine. With the patient in the prone position, Betadine prep of proposed entry site accomplished, a 22 gauge needle was inserted in the region of the sacral ala (groove formed by the superior articulating process of S1 and the sacral wing). Following documentation of needle placement at the sacral ala,   Needle placement was then verified at all levels on lateral view. Following documentation of needle placement at all levels on lateral view and following negative aspiration for heme and CSF, each level was injected with 1 mL of 0.25% bupivacaine with Kenalog.     LUMBAR FACET, MEDIAL BRANCH NERVE, BLOCKS PERFORMED ON THE RIGHT SIDE   The procedure was performed on the right side exactly as was performed on the left side at the same levels and utilizing the same technique under fluoroscopic guidance.   Myoneural block injection of the lumbar paraspinal musculature region Following Betadine prep of proposed entry site a 22-gauge needle was inserted in the lumbar paraspinal musculature region and following negative aspiration 2 cc of 0.25% bupivacaine with Norflex was injected for myoneural block injection of the lumbar paraspinal musculature region times two.     The patient tolerated the procedure well. A total of 40 mg of Kenalog was utilized for the procedure.   PLAN:  1. Medications: The patient will continue presently prescribed medications. Cymbalta Flexeril and hydrocodone acetaminophen 2. May consider modification of treatment regimen at time of return appointment pending response to treatment rendered on today's visit. 3. The patient is to follow-up  with primary care physician Dr. Lennox Grumbles for further evaluation of blood pressure and general medical condition status post steroid injection performed on today's visit. 4. Surgical follow-up evaluation.. Patient prefers to avoid surgical  evaluation at this time 5. Neurological follow-up evaluation. May consider PNCV EMG studies and other studies 6. The patient may be candidate for radiofrequency procedures, implantation type procedures, and other treatment pending response to treatment and follow-up evaluation. 7. The patient has been advised to call the Pain Management Center prior to scheduled return appointment should there be significant change in condition or should patient have other concerns regarding condition prior to scheduled return appointment.  The patient is understanding and in agreement with suggested treatment plan.

## 2016-01-12 NOTE — Patient Instructions (Addendum)
PLAN   Continue present medications  Flexeril Cymbalta and hydrocodone acetaminophen  Greater trochanteric bursa injections to be performed at time of return appointment  F/U PCP Dr. Lennox Grumbles for evaliation of  BP and general medical  condition  F/U surgical evaluation We will avoid surgical evaluation at this time  Physical therapy as prescribed to meet criteria of insurance company to be approved for radiofrequency rhizolysis lumbar facet, medial branch nerves as previously discussed  F/U neurological evaluation We will avoid PNCV/EMG studies and other studies at this time  May consider radiofrequency rhizolysis or intraspinal procedures pending response to present treatmenPat and F/U evaluation   Patient to call Pain Management Center should patient have concerns prior to scheduled return appointmentFacet Blocks Patient Information  Description: The facets are joints in the spine between the vertebrae.  Like any joints in the body, facets can become irritated and painful.  Arthritis can also effect the facets.  By injecting steroids and local anesthetic in and around these joints, we can temporarily block the nerve supply to them.  Steroids act directly on irritated nerves and tissues to reduce selling and inflammation which often leads to decreased pain.  Facet blocks may be done anywhere along the spine from the neck to the low back depending upon the location of your pain.   After numbing the skin with local anesthetic (like Novocaine), a small needle is passed onto the facet joints under x-ray guidance.  You may experience a sensation of pressure while this is being done.  The entire block usually lasts about 15-25 minutes.   Conditions which may be treated by facet blocks:   Low back/buttock pain  Neck/shoulder pain  Certain types of headaches  Preparation for the injection:  1. Do not eat any solid food or dairy products within 8 hours of your appointment. 2. You may drink  clear liquid up to 3 hours before appointment.  Clear liquids include water, black coffee, juice or soda.  No milk or cream please. 3. You may take your regular medication, including pain medications, with a sip of water before your appointment.  Diabetics should hold regular insulin (if taken separately) and take 1/2 normal NPH dose the morning of the procedure.  Carry some sugar containing items with you to your appointment. 4. A driver must accompany you and be prepared to drive you home after your procedure. 5. Bring all your current medications with you. 6. An IV may be inserted and sedation may be given at the discretion of the physician. 7. A blood pressure cuff, EKG and other monitors will often be applied during the procedure.  Some patients may need to have extra oxygen administered for a short period. 8. You will be asked to provide medical information, including your allergies and medications, prior to the procedure.  We must know immediately if you are taking blood thinners (like Coumadin/Warfarin) or if you are allergic to IV iodine contrast (dye).  We must know if you could possible be pregnant.  Possible side-effects:   Bleeding from needle site  Infection (rare, may require surgery)  Nerve injury (rare)  Numbness & tingling (temporary)  Difficulty urinating (rare, temporary)  Spinal headache (a headache worse with upright posture)  Light-headedness (temporary)  Pain at injection site (serveral days)  Decreased blood pressure (rare, temporary)  Weakness in arm/leg (temporary)  Pressure sensation in back/neck (temporary)   Call if you experience:   Fever/chills associated with headache or increased back/neck pain  Headache worsened by  an upright position  New onset, weakness or numbness of an extremity below the injection site  Hives or difficulty breathing (go to the emergency room)  Inflammation or drainage at the injection site(s)  Severe back/neck  pain greater than usual  New symptoms which are concerning to you  Please note:  Although the local anesthetic injected can often make your back or neck feel good for several hours after the injection, the pain will likely return. It takes 3-7 days for steroids to work.  You may not notice any pain relief for at least one week.  If effective, we will often do a series of 2-3 injections spaced 3-6 weeks apart to maximally decrease your pain.  After the initial series, you may be a candidate for a more permanent nerve block of the facets.  If you have any questions, please call #[phone] [facility] Discharge Instructions  General Discharge Instructions :  If you need to reach your doctor call: Monday-Friday 8:00 am - 4:00 pm at [phone] or toll free [phone].  After clinic hours [phone] to have operator reach doctor.  Bring all of your medication bottles to all your appointments in the pain clinic.  To cancel or reschedule your appointment with Pain Management please remember to call 24 hours in advance to avoid a fee.  Refer to the educational materials which you have been given on: General Risks, I had my Procedure. Discharge Instructions, Post Sedation.  Post Procedure Instructions:  The drugs you were given will stay in your system until tomorrow, so for the next 24 hours you should not drive, make any legal decisions or drink any alcoholic beverages.  You may eat anything you prefer, but it is better to start with liquids then soups and crackers, and gradually work up to solid foods.  Please notify your doctor immediately if you have any unusual bleeding, trouble breathing or pain that is not related to your normal pain.  Depending on the type of procedure that was done, some parts of your body may feel week and/or numb.  This usually clears up by tonight or the next day.  Walk with the use of an assistive device  or accompanied by an adult for the 24 hours.  You may use ice on the affected area for the first 24 hours.  Put ice in a Ziploc bag and cover with a towel and place against area 15 minutes on 15 minutes off.  You may switch to heat after 24 hours.GENERAL RISKS AND COMPLICATIONS  What are the risk, side effects and possible complications? Generally speaking, most procedures are safe.  However, with any procedure there are risks, side effects, and the possibility of complications.  The risks and complications are dependent upon the sites that are lesioned, or the type of nerve block to be performed.  The closer the procedure is to the spine, the more serious the risks are.  Great care is taken when placing the radio frequency needles, block needles or lesioning probes, but sometimes complications can occur. 1. Infection: Any time there is an injection through the skin, there is a risk of infection.  This is why sterile conditions are used for these blocks.  There are four possible types of infection. 1. Localized skin infection. 2. Central Nervous System Infection-This can be in the form of Meningitis, which can be deadly. 3. Epidural Infections-This can be in the form of an epidural abscess, which can cause pressure inside  of the spine, causing compression of the spinal cord with subsequent paralysis. This would require an emergency surgery to decompress, and there are no guarantees that the patient would recover from the paralysis. 4. Discitis-This is an infection of the intervertebral discs.  It occurs in about 1% of discography procedures.  It is difficult to treat and it may lead to surgery.        2. Pain: the needles have to go through skin and soft tissues, will cause soreness.       3. Damage to internal structures:  The nerves to be lesioned may be near blood vessels or    other nerves which can be potentially damaged.       4. Bleeding: Bleeding is more common if the patient is taking blood  thinners such as  aspirin, Coumadin, Ticiid, Plavix, etc., or if he/she have some genetic predisposition  such as hemophilia. Bleeding into the spinal canal can cause compression of the spinal  cord with subsequent paralysis.  This would require an emergency surgery to  decompress and there are no guarantees that the patient would recover from the  paralysis.       5. Pneumothorax:  Puncturing of a lung is a possibility, every time a needle is introduced in  the area of the chest or upper back.  Pneumothorax refers to free air around the  collapsed lung(s), inside of the thoracic cavity (chest cavity).  Another two possible  complications related to a similar event would include: Hemothorax and Chylothorax.   These are variations of the Pneumothorax, where instead of air around the collapsed  lung(s), you may have blood or chyle, respectively.       6. Spinal headaches: They may occur with any procedures in the area of the spine.       7. Persistent CSF (Cerebro-Spinal Fluid) leakage: This is a rare problem, but may occur  with prolonged intrathecal or epidural catheters either due to the formation of a fistulous  track or a dural tear.       8. Nerve damage: By working so close to the spinal cord, there is always a possibility of  nerve damage, which could be as serious as a permanent spinal cord injury with  paralysis.       9. Death:  Although rare, severe deadly allergic reactions known as "Anaphylactic  reaction" can occur to any of the medications used.      10. Worsening of the symptoms:  We can always make thing worse.  What are the chances of something like this happening? Chances of any of this occuring are extremely low.  By statistics, you have more of a chance of getting killed in a motor vehicle accident: while driving to the hospital than any of the above occurring .  Nevertheless, you should be aware that they are possibilities.  In general, it is similar to taking a shower.  Everybody knows  that you can slip, hit your head and get killed.  Does that mean that you should not shower again?  Nevertheless always keep in mind that statistics do not mean anything if you happen to be on the wrong side of them.  Even if a procedure has a 1 (one) in a 1,000,000 (million) chance of going wrong, it you happen to be that one..Also, keep in mind that by statistics, you have more of a chance of having something go wrong when taking medications.  Who should not have this procedure?  If you are on a blood thinning medication (e.g. Coumadin, Plavix, see list of "Blood Thinners"), or if you have an active infection going on, you should not have the procedure.  If you are taking any blood thinners, please inform your physician.  How should I prepare for this procedure?  Do not eat or drink anything at least six hours prior to the procedure.  Bring a driver with you .  It cannot be a taxi.  Come accompanied by an adult that can drive you back, and that is strong enough to help you if your legs get weak or numb from the local anesthetic.  Take all of your medicines the morning of the procedure with just enough water to swallow them.  If you have diabetes, make sure that you are scheduled to have your procedure done first thing in the morning, whenever possible.  If you have diabetes, take only half of your insulin dose and notify our nurse that you have done so as soon as you arrive at the clinic.  If you are diabetic, but only take blood sugar pills (oral hypoglycemic), then do not take them on the morning of your procedure.  You may take them after you have had the procedure.  Do not take aspirin or any aspirin-containing medications, at least eleven (11) days prior to the procedure.  They may prolong bleeding.  Wear loose fitting clothing that may be easy to take off and that you would not mind if it got stained with Betadine or blood.  Do not wear any jewelry or perfume  Remove any nail  coloring.  It will interfere with some of our monitoring equipment.  NOTE: Remember that this is not meant to be interpreted as a complete list of all possible complications.  Unforeseen problems may occur.  BLOOD THINNERS The following drugs contain aspirin or other products, which can cause increased bleeding during surgery and should not be taken for 2 weeks prior to and 1 week after surgery.  If you should need take something for relief of minor pain, you may take acetaminophen which is found in Tylenol,m Datril, Anacin-3 and Panadol. It is not blood thinner. The products listed below are.  Do not take any of the products listed below in addition to any listed on your instruction sheet.  A.P.C or A.P.C with Codeine Codeine Phosphate Capsules #3 Ibuprofen Ridaura  ABC compound Congesprin Imuran rimadil  Advil Cope Indocin Robaxisal  Alka-Seltzer Effervescent Pain Reliever and Antacid Coricidin or Coricidin-D  Indomethacin Rufen  Alka-Seltzer plus Cold Medicine Cosprin Ketoprofen S-A-C Tablets  Anacin Analgesic Tablets or Capsules Coumadin Korlgesic Salflex  Anacin Extra Strength Analgesic tablets or capsules CP-2 Tablets Lanoril Salicylate  Anaprox Cuprimine Capsules Levenox Salocol  Anexsia-D Dalteparin Magan Salsalate  Anodynos Darvon compound Magnesium Salicylate Sine-off  Ansaid Dasin Capsules Magsal Sodium Salicylate  Anturane Depen Capsules Marnal Soma  APF Arthritis pain formula Dewitt's Pills Measurin Stanback  Argesic Dia-Gesic Meclofenamic Sulfinpyrazone  Arthritis Bayer Timed Release Aspirin Diclofenac Meclomen Sulindac  Arthritis pain formula Anacin Dicumarol Medipren Supac  Analgesic (Safety coated) Arthralgen Diffunasal Mefanamic Suprofen  Arthritis Strength Bufferin Dihydrocodeine Mepro Compound Suprol  Arthropan liquid Dopirydamole Methcarbomol with Aspirin Synalgos  ASA tablets/Enseals Disalcid Micrainin Tagament  Ascriptin Doan's Midol Talwin  Ascriptin A/D Dolene  Mobidin Tanderil  Ascriptin Extra Strength Dolobid Moblgesic Ticlid  Ascriptin with Codeine Doloprin or Doloprin with Codeine Momentum Tolectin  Asperbuf Duoprin Mono-gesic Trendar  Aspergum Duradyne Motrin or Motrin IB Triminicin  Aspirin plain, buffered or enteric coated Durasal Myochrisine Trigesic  Aspirin Suppositories Easprin Nalfon Trillsate  Aspirin with Codeine Ecotrin Regular or Extra Strength Naprosyn Uracel  Atromid-S Efficin Naproxen Ursinus  Auranofin Capsules Elmiron Neocylate Vanquish  Axotal Emagrin Norgesic Verin  Azathioprine Empirin or Empirin with Codeine Normiflo Vitamin E  Azolid Emprazil Nuprin Voltaren  Bayer Aspirin plain, buffered or children's or timed BC Tablets or powders Encaprin Orgaran Warfarin Sodium  Buff-a-Comp Enoxaparin Orudis Zorpin  Buff-a-Comp with Codeine Equegesic Os-Cal-Gesic   Buffaprin Excedrin plain, buffered or Extra Strength Oxalid   Bufferin Arthritis Strength Feldene Oxphenbutazone   Bufferin plain or Extra Strength Feldene Capsules Oxycodone with Aspirin   Bufferin with Codeine Fenoprofen Fenoprofen Pabalate or Pabalate-SF   Buffets II Flogesic Panagesic   Buffinol plain or Extra Strength Florinal or Florinal with Codeine Panwarfarin   Buf-Tabs Flurbiprofen Penicillamine   Butalbital Compound Four-way cold tablets Penicillin   Butazolidin Fragmin Pepto-Bismol   Carbenicillin Geminisyn Percodan   Carna Arthritis Reliever Geopen Persantine   Carprofen Gold's salt Persistin   Chloramphenicol Goody's Phenylbutazone   Chloromycetin Haltrain Piroxlcam   Clmetidine heparin Plaquenil   Cllnoril Hyco-pap Ponstel   Clofibrate Hydroxy chloroquine Propoxyphen         Before stopping any of these medications, be sure to consult the physician who ordered them.  Some, such as Coumadin (Warfarin) are ordered to prevent or treat serious conditions such as "deep thrombosis", "pumonary embolisms", and other heart problems.  The amount of time that  you may need off of the medication may also vary with the medication and the reason for which you were taking it.  If you are taking any of these medications, please make sure you notify your pain physician before you undergo any procedures.  Prescription given for hydrocodone and flexeril

## 2016-01-13 ENCOUNTER — Telehealth: Payer: Self-pay | Admitting: *Deleted

## 2016-01-13 NOTE — Telephone Encounter (Signed)
No problems post procedure. 

## 2016-01-19 ENCOUNTER — Ambulatory Visit: Payer: BC Managed Care – PPO | Attending: Pain Medicine | Admitting: Pain Medicine

## 2016-01-19 ENCOUNTER — Encounter: Payer: Self-pay | Admitting: Pain Medicine

## 2016-01-19 VITALS — BP 125/79 | HR 76 | Temp 95.7°F | Resp 16 | Ht 66.0 in | Wt 172.0 lb

## 2016-01-19 DIAGNOSIS — M7062 Trochanteric bursitis, left hip: Secondary | ICD-10-CM | POA: Diagnosis not present

## 2016-01-19 DIAGNOSIS — M7072 Other bursitis of hip, left hip: Secondary | ICD-10-CM

## 2016-01-19 DIAGNOSIS — M7071 Other bursitis of hip, right hip: Secondary | ICD-10-CM

## 2016-01-19 DIAGNOSIS — M7061 Trochanteric bursitis, right hip: Secondary | ICD-10-CM | POA: Diagnosis not present

## 2016-01-19 DIAGNOSIS — M7060 Trochanteric bursitis, unspecified hip: Secondary | ICD-10-CM

## 2016-01-19 DIAGNOSIS — M533 Sacrococcygeal disorders, not elsewhere classified: Secondary | ICD-10-CM

## 2016-01-19 DIAGNOSIS — M47816 Spondylosis without myelopathy or radiculopathy, lumbar region: Secondary | ICD-10-CM

## 2016-01-19 DIAGNOSIS — M25559 Pain in unspecified hip: Secondary | ICD-10-CM | POA: Diagnosis present

## 2016-01-19 DIAGNOSIS — M5136 Other intervertebral disc degeneration, lumbar region: Secondary | ICD-10-CM

## 2016-01-19 MED ORDER — SODIUM CHLORIDE 0.9% FLUSH: 20.0000 mL | Freq: Once | INTRAVENOUS | Status: AC

## 2016-01-19 MED ORDER — BUPIVACAINE HCL (PF) 0.25 % IJ SOLN
30.0000 mL | Freq: Once | INTRAMUSCULAR | Status: AC
  Filled 2016-01-19: qty 30

## 2016-01-19 MED ORDER — ORPHENADRINE CITRATE 30 MG/ML IJ SOLN
60.0000 mg | Freq: Once | INTRAMUSCULAR | Status: AC
  Filled 2016-01-19: qty 2

## 2016-01-19 MED ORDER — TRIAMCINOLONE ACETONIDE 40 MG/ML IJ SUSP
40.0000 mg | Freq: Once | INTRAMUSCULAR | Status: AC
  Filled 2016-01-19: qty 1

## 2016-01-19 NOTE — Patient Instructions (Addendum)
PLAN   Continue present medications  Flexeril Cymbalta and hydrocodone acetaminophen  F/U PCP Dr. Lennox Grumbles for evaliation of  BP and general medical  condition  F/U surgical evaluation We will avoid surgical evaluation at this time. Patient without desire to consider surgical intervention  Physical therapy as prescribed to meet criteria of insurance company to be approved for radiofrequency rhizolysis lumbar facet, medial branch nerves as previously discussed  F/U neurological evaluation We will avoid PNCV/EMG studies and other studies at this time  May consider radiofrequency rhizolysis or intraspinal procedures pending response to present treatmenPat and F/U evaluation   Patient to call Pain Management Center should patient have concerns prior to scheduled return appointmentCeliac Plexus Block Patient Information  Description: The celiac plexus is a group of nerves which are part of the sympathetic nervous system.  These nerves supply organs in the abdomen and pelvis.  Specific organs supplied with sensation by the celiac plexus include the stomach, liver, gallbladder, pancreas, kidneys and part of the gut.   The celiac plexus is located on both sides of the aorta at approximately the level of the first lumbar vertebral body.  The block will be performed with you lying on your abdomen with a pillow underneath.  Using direct x-ray guidance, the celiac plexus will be located on both sides of the spine.  Numbing medicine will be used to deaden the skin prior to needle insertion.  In most cases, a small amount of sedation can be given by IV prior to the numbing medicine.  Two small needles will be place near the celiac plexus and local anesthetic and steroid will be injected.  The entire block usually last about 15-25 minutes.  Conditions which may be treated by celiac plexus block:   Acute and chronic pancreatitis  Pain from liver or pancreatic cancer  Pain from Crohn's disease  Other types  of abdominal or flank pain  Preparation for the injection:  1. Do not eat any solid food or dairy products within 8 hours of your appointment. 2. You may drink clear liquids up to 3 hours before appointment.  Clear liquids include water, black coffee, juice or soda.  No milk or cream please. 3. You may take your regular medication, including pain medications, with a sip of water before your appointment.  Diabetics should hold regular insulin (if taken separately) and take 1/2 normal NPH dose in the morning of the procedure.  Carry some sugar containing items with you to your appointment. 4. A driver must accompany you and be prepared to drive you home after your procedure. 5. Bring all your current medications with you. 6. An IV may be inserted and sedation may be given at the discretion of the physician. 7. A blood pressure cuff, EKG, and other monitors will often be applied during the procedure.  Some patients may need to have extra oxygen administered for a short period. 8. You will be asked to provide medical information, including your allergies and medications, prior to the procedure.  We must know immediately if you are taking blood thinners (like Coumadin/Warfarin) or if you are allergic to IV iodine contrast (dye).  We must know if you could possible be pregnant.  Possible side-effects:   Bleeding from needle site or deeper  Infection (rarre, can require surgery)  Nerve injury (rare)  Numbness & tingling (temporary)  Collapsed lung (rare)  Spinal headache ( a headache worse with upright posture)  Light-headedness (temporary)  Pain at injection site (several days)  Decreased blood pressure (temporary)  Weakness in legs (temporary)  Seizure or other drug reaction (rare)  Call if you experience:   Fever/chills associated with headache or increased back/neck pain  Headache worsened by an upright position  New onset weakness or numbness of an extremity below the  injection site.  Hives or difficulty breathing (go to the emergency room)  Inflammation or drainage at the injection site.  New onset diarrhea lasting more than 2 weeks.  New symptoms which are concerning to you  Please note:  If effective, we will often do a series of 2-3 injections spaced 3-6 weeks apart to maximally decrease your pain.  If initial series is effective, you may be a candidate for a more permanent block of the celiac plexus. .  If you have questions, please call [phone] [facility]    Pain Management Discharge Instructions  General Discharge Instructions :  If you need to reach your doctor call: Monday-Friday 8:00 am - 4:00 pm at [phone] or toll free [phone].  After clinic hours [phone] to have operator reach doctor.  Bring all of your medication bottles to all your appointments in the pain clinic.  To cancel or reschedule your appointment with Pain Management please remember to call 24 hours in advance to avoid a fee.  Refer to the educational materials which you have been given on: General Risks, I had my Procedure. Discharge Instructions, Post Sedation.  Post Procedure Instructions:  The drugs you were given will stay in your system until tomorrow, so for the next 24 hours you should not drive, make any legal decisions or drink any alcoholic beverages.  You may eat anything you prefer, but it is better to start with liquids then soups and crackers, and gradually work up to solid foods.  Please notify your doctor immediately if you have any unusual bleeding, trouble breathing or pain that is not related to your normal pain.  Depending on the type of procedure that was done, some parts of your body may feel week and/or numb.  This usually clears up by tonight or the next day.  Walk with the use of an assistive device or accompanied by an adult for the 24 hours.  You may use ice on the affected area  for the first 24 hours.  Put ice in a Ziploc bag and cover with a towel and place against area 15 minutes on 15 minutes off.  You may switch to heat after 24 hours.

## 2016-01-19 NOTE — Progress Notes (Signed)
                                          LEFT AND RIGHT GREATER TROCHANTERIC BURSA  INJECTIONS     The patient is a 60  -year-old female who returns to pain management for further evaluation and treatment of pain involving the hip and greater trochanteric region. The patient is with  severely disabling pain reproduced  by palpation of  the greater  trochanteric region and with manipulation of the hip . There is concern regarding the patient's pain being due to a significant component of greater trochanteric bursitis and iliotibial band syndrome as well as degenerative joint disease of the hip. The risk, benefits, and expectations of the procedure were discussed with and explained to the patient who was with understanding and in agreement with the suggested treatment plan.       Description Of Procedure:  Left Greater Trochanteric Bursa Injection  The patient was positioned in the lateral decubitus position. EKG, blood pressure, pulse, and pulse oximetry monitors were all in place. Identification of landmarks for needle entry for the procedure was accomplished and Betadine prep of the proposed needle entry site was accomplished.  A 22-gauge needle was inserted and 5 cc of 0.25% bupivacaine with Kenalog was injected. The needle was removed. The needle was reinserted in the region of the greater trochanter and in additional 5 cc of preservative-free normal saline with Kenalog was injected for left greater trochanteric bursa injection. The needle was removed.    Description Of Procedure :  Right Greater Trochanteric Bursa Injection The procedure was performed on the right exactly as was performed on the left utilizing the same technique   The patient tolerated the procedure well  A total of 40 mg of Kenalog was utilized for the procedure     PLAN  Continue present medications Cymbalta Flexeril and hydrocodone acetaminophen  F/U PCP Dr. Lennox Grumbles for evaluation of  BP and general  medical  condition  F/U surgical evaluation. May consider pending follow-up evaluations  F/U neurological evaluation. May consider pending follow-up evaluations  May consider radiofrequency rhizolysis or intraspinal procedures pending response to present treatment and F/U evaluation   Patient to call [facility] Management Center should patient have concerns prior to scheduled return appointment.

## 2016-01-19 NOTE — Progress Notes (Signed)
Safety precautions to be maintained throughout the outpatient stay will include: orient to surroundings, keep bed in low position, maintain call bell within reach at all times, provide assistance with transfer out of bed and ambulation.  

## 2016-01-20 ENCOUNTER — Telehealth: Payer: Self-pay | Admitting: *Deleted

## 2016-01-20 NOTE — Telephone Encounter (Signed)
Left voicemail with patient that if she has any questions re; procedure on yesterday to please call our office.

## 2016-01-29 ENCOUNTER — Telehealth: Payer: Self-pay | Admitting: *Deleted

## 2016-02-09 ENCOUNTER — Ambulatory Visit: Payer: BC Managed Care – PPO | Attending: Pain Medicine | Admitting: Pain Medicine

## 2016-02-09 ENCOUNTER — Encounter: Payer: Self-pay | Admitting: Pain Medicine

## 2016-02-09 VITALS — BP 131/78 | HR 88 | Temp 98.6°F | Resp 18 | Ht 66.0 in | Wt 172.0 lb

## 2016-02-09 DIAGNOSIS — M545 Low back pain: Secondary | ICD-10-CM | POA: Diagnosis present

## 2016-02-09 DIAGNOSIS — M5136 Other intervertebral disc degeneration, lumbar region: Secondary | ICD-10-CM | POA: Diagnosis not present

## 2016-02-09 DIAGNOSIS — M533 Sacrococcygeal disorders, not elsewhere classified: Secondary | ICD-10-CM | POA: Diagnosis not present

## 2016-02-09 DIAGNOSIS — M7060 Trochanteric bursitis, unspecified hip: Secondary | ICD-10-CM

## 2016-02-09 MED ORDER — HYDROCODONE-ACETAMINOPHEN 5-325 MG PO TABS: ORAL_TABLET | ORAL | 0 refills | Status: DC

## 2016-02-09 MED ORDER — CYCLOBENZAPRINE HCL 10 MG PO TABS: ORAL_TABLET | ORAL | 0 refills | Status: AC

## 2016-02-09 MED ORDER — DULOXETINE HCL 20 MG PO CPEP: ORAL_CAPSULE | ORAL | 0 refills | Status: DC

## 2016-02-09 MED ORDER — DICLOFENAC EPOLAMINE 1.3 % TD PTCH: MEDICATED_PATCH | TRANSDERMAL | 0 refills | Status: DC

## 2016-02-09 NOTE — Patient Instructions (Signed)
PLAN   Continue present medications  Flexeril Cymbalta and hydrocodone acetaminophen BEGIN Flector patch as prescribed  F/U PCP Dr. Lennox Grumbles for evaliation of  BP and general medical  condition  F/U surgical evaluation We will avoid surgical evaluation at this time  Physical therapy as prescribed to meet criteria of insurance company to be approved for radiofrequency rhizolysis lumbar facet, medial branch nerves as previously discussed  F/U neurological evaluation We will avoid PNCV/EMG studies and other studies at this time  May consider radiofrequency rhizolysis or intraspinal procedures pending response to present treatmenPat and F/U evaluation   Patient to call Pain Management Center should patient have concerns prior to scheduled return appointment

## 2016-02-09 NOTE — Progress Notes (Signed)
Safety precautions to be maintained throughout the outpatient stay will include: orient to surroundings, keep bed in low position, maintain call bell within reach at all times, provide assistance with transfer out of bed and ambulation.  

## 2016-02-10 ENCOUNTER — Ambulatory Visit: Payer: BC Managed Care – PPO | Admitting: Pain Medicine

## 2016-02-10 NOTE — Progress Notes (Signed)
   The patient is a 60 year old female who returns to pain management for further evaluation and treatment of pain of the lower back lower extremity region and greater trochanteric regions. The patient is with pain fairly well-controlled at this time and we will continue presently prescribed medications. The patient will call pain management should they be change in condition prior to scheduled return appointment. All agreed to suggested treatment plan.   Physical examination  There was tenderness over the paraspinal muscular region cervical region cervical facet region as well as the splenius capitis occipitalis region Patient was with unremarkable Spurling's maneuver and Tinel and Phalen's maneuver were without increased pain of significant degree The patient was with bilaterally equal grip strength area there was no increased pain with Tinel and Phalen's maneuver. Palpation over the lumbar paraspinal muscles reproduce moderate discomfort with lateral bending rotation extension and palpation of the lumbar facets reproducing moderate discomfort. There was tenderness of the PSIS and PII S region a moderate degree as well as moderate tenderness of the greater trochanteric region iliotibial band region. Straight leg raising was tolerates approximately 30 without increased pain with dorsiflexion noted. There was negative clonus negative Homans with no sensory deficit or dermatomal distribution detected. 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 Lennox Grumbles for evaliation of  BP and general medical   condition  F/U surgical evaluation We will avoid surgical evaluation at this time  Physical therapy as prescribed to meet criteria of insurance company to be approved for radiofrequency rhizolysis lumbar facet, medial branch nerves as previously discussed  F/U neurological evaluation We will avoid PNCV/EMG studies and other studies at this time  May consider radiofrequency rhizolysis or intraspinal procedures pending response to present treatmenPat and F/U evaluation   Patient to call Pain Management Center should patient have concerns prior to scheduled return appointment

## 2016-02-11 ENCOUNTER — Ambulatory Visit: Payer: BC Managed Care – PPO | Admitting: Pain Medicine

## 2016-03-28 ENCOUNTER — Other Ambulatory Visit: Payer: Self-pay | Admitting: Pain Medicine

## 2016-03-29 ENCOUNTER — Other Ambulatory Visit: Payer: Self-pay | Admitting: Family Medicine

## 2016-03-29 DIAGNOSIS — Z1231 Encounter for screening mammogram for malignant neoplasm of breast: Secondary | ICD-10-CM

## 2016-04-06 ENCOUNTER — Encounter (INDEPENDENT_AMBULATORY_CARE_PROVIDER_SITE_OTHER): Payer: Self-pay

## 2016-04-06 ENCOUNTER — Ambulatory Visit
Admission: RE | Admit: 2016-04-06 | Discharge: 2016-04-06 | Disposition: A | Discharge status: Home / Self Care | Payer: BC Managed Care – PPO | Source: Ambulatory Visit | Attending: Family Medicine | Admitting: Family Medicine

## 2016-04-06 DIAGNOSIS — Z1231 Encounter for screening mammogram for malignant neoplasm of breast: Secondary | ICD-10-CM | POA: Diagnosis not present

## 2016-07-28 ENCOUNTER — Encounter: Payer: Self-pay | Admitting: Anesthesiology

## 2016-07-28 ENCOUNTER — Ambulatory Visit: Payer: BC Managed Care – PPO | Attending: Anesthesiology | Admitting: Anesthesiology

## 2016-07-28 VITALS — BP 118/83 | HR 83 | Temp 98.3°F | Resp 16 | Ht 66.0 in | Wt 174.0 lb

## 2016-07-28 DIAGNOSIS — M545 Low back pain: Secondary | ICD-10-CM | POA: Insufficient documentation

## 2016-07-28 DIAGNOSIS — F329 Major depressive disorder, single episode, unspecified: Secondary | ICD-10-CM | POA: Insufficient documentation

## 2016-07-28 DIAGNOSIS — Z90710 Acquired absence of both cervix and uterus: Secondary | ICD-10-CM | POA: Diagnosis not present

## 2016-07-28 DIAGNOSIS — Z823 Family history of stroke: Secondary | ICD-10-CM | POA: Diagnosis not present

## 2016-07-28 DIAGNOSIS — G8929 Other chronic pain: Secondary | ICD-10-CM | POA: Insufficient documentation

## 2016-07-28 DIAGNOSIS — Z809 Family history of malignant neoplasm, unspecified: Secondary | ICD-10-CM | POA: Diagnosis not present

## 2016-07-28 DIAGNOSIS — M79605 Pain in left leg: Secondary | ICD-10-CM | POA: Diagnosis not present

## 2016-07-28 DIAGNOSIS — M47816 Spondylosis without myelopathy or radiculopathy, lumbar region: Secondary | ICD-10-CM

## 2016-07-28 DIAGNOSIS — M1288 Other specific arthropathies, not elsewhere classified, other specified site: Secondary | ICD-10-CM | POA: Diagnosis not present

## 2016-07-28 DIAGNOSIS — Z8249 Family history of ischemic heart disease and other diseases of the circulatory system: Secondary | ICD-10-CM | POA: Diagnosis not present

## 2016-07-28 DIAGNOSIS — M5136 Other intervertebral disc degeneration, lumbar region: Secondary | ICD-10-CM

## 2016-07-28 DIAGNOSIS — Z9889 Other specified postprocedural states: Secondary | ICD-10-CM | POA: Diagnosis not present

## 2016-07-28 DIAGNOSIS — Z79899 Other long term (current) drug therapy: Secondary | ICD-10-CM | POA: Diagnosis not present

## 2016-07-28 DIAGNOSIS — Z87891 Personal history of nicotine dependence: Secondary | ICD-10-CM | POA: Diagnosis not present

## 2016-07-28 DIAGNOSIS — M79604 Pain in right leg: Secondary | ICD-10-CM | POA: Insufficient documentation

## 2016-07-28 DIAGNOSIS — F419 Anxiety disorder, unspecified: Secondary | ICD-10-CM | POA: Diagnosis not present

## 2016-07-28 DIAGNOSIS — Z0001 Encounter for general adult medical examination with abnormal findings: Secondary | ICD-10-CM | POA: Insufficient documentation

## 2016-07-28 MED ORDER — HYDROCODONE-ACETAMINOPHEN 5-325 MG PO TABS: 1.0000 | ORAL_TABLET | Freq: Two times a day (BID) | ORAL | 0 refills | Status: DC

## 2016-07-28 NOTE — Addendum Note (Signed)
Addended by: Hart Rochester on: 07/28/2016 01:58 PM   Modules accepted: Orders

## 2016-07-28 NOTE — Progress Notes (Signed)
Subjective:  Patient ID: Katherine Lester, female    DOB: Oct 23, 1955  Age: 61 y.o. MRN: [id number]  CC: Back Pain (lower) and Leg Pain (both)     PROCEDURE:None  HPI Catheryn Younis presents for a new patient evaluation. Previously, she is a patient of Dr. Belenda Cruise crisp. She has been following with him as of recent over in [place] but has desire to return to [place] for her care. She has a long-standing history of low back pain for which she has received previous facet injections. She describes a pain that responds favorably to facet injections they give her 80% relief lasting anywhere from 1-2 months with a gradual return of her baseline pain thereafter. The pain is described as aching sometimes agonizing disabling primarily in the low back with radiation into the posterior legs and hips. She is not had any injections since August 2017. The pain is described as worse with bending climbing kneeling lifting twisting. Better with rest warm showers medication management and the nerve blocks that she's had. She's had a previous MRI and orthopedic evaluation. She furthermore has had epidural injections and facet blocks. No change in her lower extremity strength or function or bowel bladder function has been noted recently. She hasn't used periodic hydrocodone for breakthrough pain secondary to the chronic and recalcitrant nature of her pain but generally uses no more than 1-2 tablets as a maximum daily dose.  History Cameron has a past medical history of Allergy; Anxiety; Chronic back pain; Depression; Generalized edema; GERD (gastroesophageal reflux disease); IBS (irritable bowel syndrome); Insomnia; Irritable bowel syndrome; Positional vertigo; Sinusitis; and Spastic colon.   She has a past surgical history that includes Abdominal hysterectomy; Appendectomy; Fracture surgery (Left); Dilation and curettage of uterus; and Breast biopsy (Right).   Her family history includes Alcohol abuse in  her brother; COPD in her mother; Cancer in her mother; Depression in her mother; Drug abuse in her brother; Heart disease in her maternal grandmother and mother; Mental illness in her brother; Stroke in her mother.She reports that she quit smoking about 2 years ago. She quit after 20.00 years of use. She has never used smokeless tobacco. She reports that she does not drink alcohol or use drugs.  Results for orders placed in visit on 01/07/14  [facility] W/O Cm   Narrative * PRIOR REPORT IMPORTED FROM AN EXTERNAL SYSTEM *   CLINICAL DATA:  Low back, right leg and right hip pain.   EXAM:  MRI LUMBAR SPINE WITHOUT CONTRAST   TECHNIQUE:  Multiplanar, multisequence MR imaging of the lumbar spine was  performed. No intravenous contrast was administered.   COMPARISON:  None.   FINDINGS:  Vertebral body height, signal and alignment are maintained.  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 The conus medullaris is normal in signal  and position. Imaged intra-abdominal contents are unremarkable.   The T11-12 and T12-L1 levels are imaged in the sagittal plane only  and negative.   L1-2:  Negative.   L2-3:  Negative.   L3-4:  Negative.   L4-5: Minimal disc bulge without central canal or foraminal  narrowing.   L5-S1: Minimal disc bulge without central canal or foraminal  narrowing. 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 No nerve root compression is identified.  Electronically Signed    By: Inge Rise M.D.    On: 01/07/2014 10:46        No results found for: TOXASSSELUR  Outpatient Medications Prior to Visit  Medication Sig Dispense Refill  . cyclobenzaprine (FLEXERIL) 10 MG tablet Limit 1 tab by mouth 2-4 times a day if tolerated 100 tablet 0  . DULoxetine  (CYMBALTA) 20 MG capsule Limit 1 capsule by mouth per day if tolerated 30 capsule 0  . esomeprazole (NEXIUM) 40 MG capsule Take 40 mg by mouth daily at 12 noon.    Marland Kitchen HYDROcodone-acetaminophen (NORCO/VICODIN) 5-325 MG tablet Limit 1/2 - 1 tablet by mouth daily or twice per day if tolerated 40 tablet 0  . Cholecalciferol (VITAMIN D3) 10000 UNITS capsule Take 50,000 Units by mouth once a week.     . diclofenac (FLECTOR) 1.3 % PTCH Apply patch or a portion of patch to painful area of skin twice per day if tolerated (Patient not taking: Reported on 07/28/2016) 60 patch 0  . zolpidem (AMBIEN) 5 MG tablet Take 5 mg by mouth at bedtime.     Facility-Administered Medications Prior to Visit  Medication Dose Route Frequency Provider Last Rate Last Dose  . bupivacaine (PF) (MARCAINE) 0.25 % injection 30 mL  30 mL Other Once Mohammed Kindle, MD      . bupivacaine (PF) (MARCAINE) 0.25 % injection 30 mL  30 mL Other Once Mohammed Kindle, MD      . bupivacaine (PF) (MARCAINE) 0.25 % injection 30 mL  30 mL Other Once Mohammed Kindle, MD      . bupivacaine (PF) (MARCAINE) 0.25 % injection 30 mL  30 mL Other Once Mohammed Kindle, MD      . fentaNYL (SUBLIMAZE) injection 100 mcg  100 mcg Intravenous Once Mohammed Kindle, MD      . fentaNYL (SUBLIMAZE) injection 100 mcg  100 mcg Intravenous Once Mohammed Kindle, MD      . lactated ringers infusion 1,000 mL  1,000 mL Intravenous Continuous Mohammed Kindle, MD      . lactated ringers infusion 1,000 mL  1,000 mL Intravenous Continuous Mohammed Kindle, MD      . lactated ringers infusion 1,000 mL  1,000 mL Intravenous Continuous Mohammed Kindle, MD 125 mL/hr at 10/15/15 0923 1,000 mL at 10/15/15 0923  . lactated ringers infusion 1,000 mL  1,000 mL Intravenous Continuous Mohammed Kindle, MD 125 mL/hr at 01/12/16 0950 1,000 mL at 01/12/16 0950  . midazolam (VERSED) 5 MG/5ML injection 5 mg  5 mg Intravenous Once Mohammed Kindle, MD      . midazolam (VERSED) 5 MG/5ML injection 5 mg  5 mg  Intravenous Once Mohammed Kindle, MD      . orphenadrine (NORFLEX) injection 60 mg  60 mg Intramuscular Once Mohammed Kindle, MD      . orphenadrine (NORFLEX) injection 60 mg  60 mg Intramuscular Once Mohammed Kindle, MD      . orphenadrine (NORFLEX) injection 60 mg  60 mg Intramuscular Once Mohammed Kindle, MD      . orphenadrine (NORFLEX) injection 60 mg  60 mg Intramuscular Once Mohammed Kindle, MD      . sodium chloride flush (NS) 0.9 % injection 20 mL  20 mL Other Once Mohammed Kindle, MD      . sodium chloride flush (NS) 0.9 % injection 20 mL  20 mL Other Once Mohammed Kindle, MD      . triamcinolone acetonide (KENALOG-40) injection 40 mg  40 mg Other Once [employer] Bravo, MD      . triamcinolone acetonide (KENALOG-40) injection 40 mg  40 mg Other Once Mohammed Kindle, MD      . triamcinolone acetonide [facility]) injection 40 mg  40 mg Other Once Mohammed Kindle, MD      . triamcinolone acetonide (KENALOG-40) injection 40 mg  40 mg Other Once Mohammed Kindle, MD      . triamcinolone acetonide (KENALOG-40) injection 40 mg  40 mg Other Once Mohammed Kindle, MD       No results found for: WBC, HGB, HCT, PLT, GLUCOSE, CHOL, TRIG, HDL, LDLDIRECT, LDLCALC, ALT, AST, NA, K, CL, CREATININE, BUN, CO2, TSH, PSA, INR, GLUF, HGBA1C, MICROALBUR  --------------------------------------------------------------------------------------------------------------------- Mm Digital Screening Bilateral  Result Date: 04/07/2016 CLINICAL DATA:  Screening. EXAM: DIGITAL SCREENING BILATERAL MAMMOGRAM WITH CAD COMPARISON:  Previous exam(s). ACR Breast Density Category b: There are scattered areas of fibroglandular density. FINDINGS: There are no findings suspicious for malignancy. Images were processed with CAD. IMPRESSION: No mammographic evidence of malignancy. A result letter of this screening mammogram will be mailed directly to the patient. RECOMMENDATION: Screening mammogram in one year. (Code:SM-B-01Y) BI-RADS CATEGORY  1: Negative.  Electronically Signed   By: Nolon Nations M.D.   On: 04/07/2016 09:57       ---------------------------------------------------------------------------------------------------------------------- Past Medical History:  Diagnosis Date  . Allergy   . Anxiety   . Chronic back pain   . Depression   . Generalized edema   . GERD (gastroesophageal reflux disease)   . IBS (irritable bowel syndrome)   . Insomnia   . Irritable bowel syndrome   . Positional vertigo   . Sinusitis   . Spastic colon     Past Surgical History:  Procedure Laterality Date  . ABDOMINAL HYSTERECTOMY    . APPENDECTOMY    . BREAST BIOPSY Right    stereo bx-neg  . DILATION AND CURETTAGE OF UTERUS    . FRACTURE SURGERY Left    arm    Family History  Problem Relation Age of Onset  . Cancer Mother   . COPD Mother   . Depression Mother   . Heart disease Mother   . Stroke Mother   . Drug abuse Brother   . Alcohol abuse Brother   . Mental illness Brother   . Heart disease Maternal Grandmother   . Breast cancer Neg Hx     Social History  Substance Use Topics  . Smoking status: Former Smoker    Years: 20.00    Quit date: 10/14/2013  . Smokeless tobacco: Never Used  . Alcohol use No    ---------------------------------------------------------------------------------------------------------------------  Scheduled Meds: Continuous Infusions: PRN Meds:.   BP 118/83 (BP Location: Right Arm, Patient Position: Sitting, Cuff Size: Normal)   Pulse 83   Temp 98.3 F (36.8 C) (Oral)   Resp 16   Ht 5\' 6"  (1.676 m)   Wt 174 lb (78.9 kg)   SpO2 98%   BMI 28.08 kg/m    BP Readings from Last 3 Encounters:  07/28/16 118/83  02/09/16 131/78  01/19/16 125/79     Wt Readings from Last 3 Encounters:  07/28/16 174 lb (78.9 kg)  02/09/16 172 lb (78 kg)  01/19/16 172 lb (78 kg)      ----------------------------------------------------------------------------------------------------------------------  ROS Review of Systems  Cardiac: No angina Pulmonary: No shortness of breath Neurologic: As above GI: History of ulcers and occasional reflux Psychiatric: No history of drug abuse or suicidal or homicidal ideation.  Objective:  BP 118/83 (BP Location:  Right Arm, Patient Position: Sitting, Cuff Size: Normal)   Pulse 83   Temp 98.3 F (36.8 C) (Oral)   Resp 16   Ht 5\' 6"  (1.676 m)   Wt 174 lb (78.9 kg)   SpO2 98%   BMI 28.08 kg/m   Physical Exam Pupils are equally round reactive to light extraocular muscles are intact asked line heart is regular rate and rhythm without murmur Lungs are clear to auscultation Inspection of her low back reveals some mild paraspinous muscle tenderness and a pain that is reproduced with extension and left and right lateral rotation yielding radiation of pain into the buttocks and posterior hips. She is able to walk on her heels and toes with an unsteady gait and goes from seated to standing with mild difficulty.     Assessment & Plan:   Zaylani was seen today for back pain and leg pain.  Diagnoses and all orders for this visit:  Degenerative disc disease, lumbar -     LUMBAR FACET(MEDIAL BRANCH NERVE BLOCK) MBNB; Future -     ToxASSURE Select 13 (MW), Urine  Facet syndrome, lumbar -     LUMBAR FACET(MEDIAL BRANCH NERVE BLOCK) MBNB; Future -     ToxASSURE Select 13 (MW), Urine  Chronic bilateral low back pain without sciatica -     ToxASSURE Select 13 (MW), Urine  Other orders -     HYDROcodone-acetaminophen (NORCO/VICODIN) 5-325 MG tablet; Take 1 tablet by mouth 2 (two) times daily.     ----------------------------------------------------------------------------------------------------------------------  Problem List Items Addressed This Visit      Musculoskeletal and Integument   Degenerative disc disease,  lumbar - Primary   Relevant Medications   KENALOG 40 MG/ML injection   cyclobenzaprine (FLEXERIL) 10 MG tablet   HYDROcodone-acetaminophen (NORCO/VICODIN) 5-325 MG tablet   Other Relevant Orders   LUMBAR FACET(MEDIAL BRANCH NERVE BLOCK) MBNB   ToxASSURE Select 13 (MW), Urine   Facet syndrome, lumbar   Relevant Medications   KENALOG 40 MG/ML injection   cyclobenzaprine (FLEXERIL) 10 MG tablet   HYDROcodone-acetaminophen (NORCO/VICODIN) 5-325 MG tablet   Other Relevant Orders   LUMBAR FACET(MEDIAL BRANCH NERVE BLOCK) MBNB   ToxASSURE Select 13 (MW), Urine    Other Visit Diagnoses    Chronic bilateral low back pain without sciatica       Relevant Medications   KENALOG 40 MG/ML injection   cyclobenzaprine (FLEXERIL) 10 MG tablet   HYDROcodone-acetaminophen (NORCO/VICODIN) 5-325 MG tablet   Other Relevant Orders   ToxASSURE Select 13 (MW), Urine      ----------------------------------------------------------------------------------------------------------------------  1. Degenerative disc disease, lumbar Continue with back stretching strengthening exercises as reviewed with her today and aerobic conditioning.   2. Facet syndrome, lumbar Return to clinic at the next available date for her first bilateral facet block. These would be for her. Purpose as she has responded favorably to these in the past yielding 80% improvement up to 2 months in duration. Otherwise her pain has failed conservative therapy with physical therapy modalities and medication management. Lumbar  FACET(MEDIAL BRANCH NERVE BLOCK) MBNB; Future - ToxASSURE Select 13 (MW), Urine  3. Chronic bilateral low back pain without sciatica She has used chronic opioids in the past and done well with limited utilization at approximately 1 tablet per day and occasionally 2. We've gone through the risks and benefits of chronic opioid management and she has signed a narcotic contract regarding medication management and to clinic.  I have reviewed the [facility] practitioner database information and it  is appropriate.  - ToxASSURE Select 13 (MW), Urine    ----------------------------------------------------------------------------------------------------------------------  I have changed Ms. Rainville's HYDROcodone-acetaminophen. I am also having her maintain her Vitamin D3, zolpidem, esomeprazole, diclofenac, HYDROcodone-acetaminophen, cyclobenzaprine, DULoxetine, amoxicillin, amoxicillin-clavulanate, diclofenac sodium, docusate sodium, doxycycline, Vitamin D (Ergocalciferol), CHERATUSSIN AC, HYDROcodone-homatropine, senna, sodium chloride, KENALOG, zolpidem, cyclobenzaprine, DULoxetine, and esomeprazole. We will continue to administer bupivacaine (PF), fentaNYL, lactated ringers, midazolam, orphenadrine, triamcinolone acetonide, bupivacaine (PF), fentaNYL, lactated ringers, midazolam, orphenadrine, triamcinolone acetonide, bupivacaine (PF), orphenadrine, triamcinolone acetonide, lactated ringers, sodium chloride flush, triamcinolone acetonide, lactated ringers, bupivacaine (PF), orphenadrine, triamcinolone acetonide, and sodium chloride flush.   Meds ordered this encounter  Medications  . amoxicillin (AMOXIL) 500 MG capsule  . amoxicillin-clavulanate (AUGMENTIN) 875-125 MG tablet  . diclofenac sodium (VOLTAREN) 1 % GEL    Sig: Apply topically.  . docusate sodium (COLACE) 100 MG capsule    Sig: Take by mouth.  . doxycycline (VIBRA-TABS) 100 MG tablet  . Vitamin D, Ergocalciferol, (DRISDOL) 50000 units CAPS capsule    Sig: Take by mouth.  . CHERATUSSIN AC 100-10 MG/5ML syrup  . HYDROcodone-homatropine (HYCODAN) 5-1.5 MG/5ML syrup  . senna (SENOKOT) 8.6 MG tablet    Sig: Take by mouth.  . sodium chloride 0.9 % injection  . KENALOG 40 MG/ML injection  . zolpidem (AMBIEN) 10 MG tablet    Sig: Take by mouth at bedtime.   . cyclobenzaprine (FLEXERIL) 10 MG tablet    Sig: Take by mouth.  . DULoxetine (CYMBALTA) 20 MG  capsule    Sig: Take by mouth.  . esomeprazole (NEXIUM) 40 MG capsule    Sig: Take by mouth.  . DISCONTD: HYDROcodone-acetaminophen (NORCO/VICODIN) 5-325 MG tablet    Sig: 1/2-1 po bid prn  . HYDROcodone-acetaminophen (NORCO/VICODIN) 5-325 MG tablet    Sig: Take 1 tablet by mouth 2 (two) times daily.    Dispense:  45 tablet    Refill:  0    Do not fill [id number]       Follow-up: Return in about 1 month (around 08/25/2016) for evaluation, procedure.    Molli Barrows, MD 2:37 PM  The [place] practitioner database for opioid medications on this patient has been reviewed by me and my staff   Greater than 50% of the total encounter time was spent in counseling and / or coordination of care.     This dictation was performed utilizing Systems analyst.  Please excuse any unintentional or mistaken typographical errors as a result.

## 2016-07-28 NOTE — Patient Instructions (Signed)
GENERAL RISKS AND COMPLICATIONS  What are the risk, side effects and possible complications? Generally speaking, most procedures are safe.  However, with any procedure there are risks, side effects, and the possibility of complications.  The risks and complications are dependent upon the sites that are lesioned, or the type of nerve block to be performed.  The closer the procedure is to the spine, the more serious the risks are.  Great care is taken when placing the radio frequency needles, block needles or lesioning probes, but sometimes complications can occur. 1. Infection: Any time there is an injection through the skin, there is a risk of infection.  This is why sterile conditions are used for these blocks.  There are four possible types of infection. 1. Localized skin infection. 2. Central Nervous System Infection-This can be in the form of Meningitis, which can be deadly. 3. Epidural Infections-This can be in the form of an epidural abscess, which can cause pressure inside of the spine, causing compression of the spinal cord with subsequent paralysis. This would require an emergency surgery to decompress, and there are no guarantees that the patient would recover from the paralysis. 4. Discitis-This is an infection of the intervertebral discs.  It occurs in about 1% of discography procedures.  It is difficult to treat and it may lead to surgery.        2. Pain: the needles have to go through skin and soft tissues, will cause soreness.       3. Damage to internal structures:  The nerves to be lesioned may be near blood vessels or    other nerves which can be potentially damaged.       4. Bleeding: Bleeding is more common if the patient is taking blood thinners such as  aspirin, Coumadin, Ticiid, Plavix, etc., or if he/she have some genetic predisposition  such as hemophilia. Bleeding into the spinal canal can cause compression of the spinal  cord with subsequent paralysis.  This would require an  emergency surgery to  decompress and there are no guarantees that the patient would recover from the  paralysis.       5. Pneumothorax:  Puncturing of a lung is a possibility, every time a needle is introduced in  the area of the chest or upper back.  Pneumothorax refers to free air around the  collapsed lung(s), inside of the thoracic cavity (chest cavity).  Another two possible  complications related to a similar event would include: Hemothorax and Chylothorax.   These are variations of the Pneumothorax, where instead of air around the collapsed  lung(s), you may have blood or chyle, respectively.       6. Spinal headaches: They may occur with any procedures in the area of the spine.       7. Persistent CSF (Cerebro-Spinal Fluid) leakage: This is a rare problem, but may occur  with prolonged intrathecal or epidural catheters either due to the formation of a fistulous  track or a dural tear.       8. Nerve damage: By working so close to the spinal cord, there is always a possibility of  nerve damage, which could be as serious as a permanent spinal cord injury with  paralysis.       9. Death:  Although rare, severe deadly allergic reactions known as "Anaphylactic  reaction" can occur to any of the medications used.      10. Worsening of the symptoms:  We can always make thing worse.    What are the chances of something like this happening? Chances of any of this occuring are extremely low.  By statistics, you have more of a chance of getting killed in a motor vehicle accident: while driving to the hospital than any of the above occurring .  Nevertheless, you should be aware that they are possibilities.  In general, it is similar to taking a shower.  Everybody knows that you can slip, hit your head and get killed.  Does that mean that you should not shower again?  Nevertheless always keep in mind that statistics do not mean anything if you happen to be on the wrong side of them.  Even if a procedure has a 1  (one) in a 1,000,000 (million) chance of going wrong, it you happen to be that one..Also, keep in mind that by statistics, you have more of a chance of having something go wrong when taking medications.  Who should not have this procedure? If you are on a blood thinning medication (e.g. Coumadin, Plavix, see list of "Blood Thinners"), or if you have an active infection going on, you should not have the procedure.  If you are taking any blood thinners, please inform your physician.  How should I prepare for this procedure?  Do not eat or drink anything at least six hours prior to the procedure.  Bring a driver with you .  It cannot be a taxi.  Come accompanied by an adult that can drive you back, and that is strong enough to help you if your legs get weak or numb from the local anesthetic.  Take all of your medicines the morning of the procedure with just enough water to swallow them.  If you have diabetes, make sure that you are scheduled to have your procedure done first thing in the morning, whenever possible.  If you have diabetes, take only half of your insulin dose and notify our nurse that you have done so as soon as you arrive at the clinic.  If you are diabetic, but only take blood sugar pills (oral hypoglycemic), then do not take them on the morning of your procedure.  You may take them after you have had the procedure.  Do not take aspirin or any aspirin-containing medications, at least eleven (11) days prior to the procedure.  They may prolong bleeding.  Wear loose fitting clothing that may be easy to take off and that you would not mind if it got stained with Betadine or blood.  Do not wear any jewelry or perfume  Remove any nail coloring.  It will interfere with some of our monitoring equipment.  NOTE: Remember that this is not meant to be interpreted as a complete list of all possible complications.  Unforeseen problems may occur.  BLOOD THINNERS The following drugs  contain aspirin or other products, which can cause increased bleeding during surgery and should not be taken for 2 weeks prior to and 1 week after surgery.  If you should need take something for relief of minor pain, you may take acetaminophen which is found in Tylenol,m Datril, Anacin-3 and Panadol. It is not blood thinner. The products listed below are.  Do not take any of the products listed below in addition to any listed on your instruction sheet.  A.P.C or A.P.C with Codeine Codeine Phosphate Capsules #3 Ibuprofen Ridaura  ABC compound Congesprin Imuran rimadil  Advil Cope Indocin Robaxisal  Alka-Seltzer Effervescent Pain Reliever and Antacid Coricidin or Coricidin-D  Indomethacin Rufen    Alka-Seltzer plus Cold Medicine Cosprin Ketoprofen S-A-C Tablets  Anacin Analgesic Tablets or Capsules Coumadin Korlgesic Salflex  Anacin Extra Strength Analgesic tablets or capsules CP-2 Tablets Lanoril Salicylate  Anaprox Cuprimine Capsules Levenox Salocol  Anexsia-D Dalteparin Magan Salsalate  Anodynos Darvon compound Magnesium Salicylate Sine-off  Ansaid Dasin Capsules Magsal Sodium Salicylate  Anturane Depen Capsules Marnal Soma  APF Arthritis pain formula Dewitt's Pills Measurin Stanback  Argesic Dia-Gesic Meclofenamic Sulfinpyrazone  Arthritis Bayer Timed Release Aspirin Diclofenac Meclomen Sulindac  Arthritis pain formula Anacin Dicumarol Medipren Supac  Analgesic (Safety coated) Arthralgen Diffunasal Mefanamic Suprofen  Arthritis Strength Bufferin Dihydrocodeine Mepro Compound Suprol  Arthropan liquid Dopirydamole Methcarbomol with Aspirin Synalgos  ASA tablets/Enseals Disalcid Micrainin Tagament  Ascriptin Doan's Midol Talwin  Ascriptin A/D Dolene Mobidin Tanderil  Ascriptin Extra Strength Dolobid Moblgesic Ticlid  Ascriptin with Codeine Doloprin or Doloprin with Codeine Momentum Tolectin  Asperbuf Duoprin Mono-gesic Trendar  Aspergum Duradyne Motrin or Motrin IB Triminicin  Aspirin  plain, buffered or enteric coated Durasal Myochrisine Trigesic  Aspirin Suppositories Easprin Nalfon Trillsate  Aspirin with Codeine Ecotrin Regular or Extra Strength Naprosyn Uracel  Atromid-S Efficin Naproxen Ursinus  Auranofin Capsules Elmiron Neocylate Vanquish  Axotal Emagrin Norgesic Verin  Azathioprine Empirin or Empirin with Codeine Normiflo Vitamin E  Azolid Emprazil Nuprin Voltaren  Bayer Aspirin plain, buffered or children's or timed BC Tablets or powders Encaprin Orgaran Warfarin Sodium  Buff-a-Comp Enoxaparin Orudis Zorpin  Buff-a-Comp with Codeine Equegesic Os-Cal-Gesic   Buffaprin Excedrin plain, buffered or Extra Strength Oxalid   Bufferin Arthritis Strength Feldene Oxphenbutazone   Bufferin plain or Extra Strength Feldene Capsules Oxycodone with Aspirin   Bufferin with Codeine Fenoprofen Fenoprofen Pabalate or Pabalate-SF   Buffets II Flogesic Panagesic   Buffinol plain or Extra Strength Florinal or Florinal with Codeine Panwarfarin   Buf-Tabs Flurbiprofen Penicillamine   Butalbital Compound Four-way cold tablets Penicillin   Butazolidin Fragmin Pepto-Bismol   Carbenicillin Geminisyn Percodan   Carna Arthritis Reliever Geopen Persantine   Carprofen Gold's salt Persistin   Chloramphenicol Goody's Phenylbutazone   Chloromycetin Haltrain Piroxlcam   Clmetidine heparin Plaquenil   Cllnoril Hyco-pap Ponstel   Clofibrate Hydroxy chloroquine Propoxyphen         Before stopping any of these medications, be sure to consult the physician who ordered them.  Some, such as Coumadin (Warfarin) are ordered to prevent or treat serious conditions such as "deep thrombosis", "pumonary embolisms", and other heart problems.  The amount of time that you may need off of the medication may also vary with the medication and the reason for which you were taking it.  If you are taking any of these medications, please make sure you notify your pain physician before you undergo any  procedures.         Facet Joint Block The facet joints connect the bones of the spine (vertebrae). They make it possible for you to bend, twist, and make other movements with your spine. They also keep you from bending too far, twisting too far, and making other excessive movements. A facet joint block is a procedure where a numbing medicine (anesthetic) is injected into a facet joint. Often, a type of anti-inflammatory medicine called a steroid is also injected. A facet joint block may be done to diagnose neck or back pain. If the pain gets better after a facet joint block, it means the pain is probably coming from the facet joint. If the pain does not get better, it means the pain is probably not coming   from the facet joint. A facet joint block may also be done to relieve neck or back pain caused by an inflamed facet joint. A facet joint block is only done to relieve pain if the pain does not improve with other methods, such as medicine, exercise programs, and physical therapy. Tell a health care provider about:  Any allergies you have.  All medicines you are taking, including vitamins, herbs, eye drops, creams, and over-the-counter medicines.  Any problems you or family members have had with anesthetic medicines.  Any blood disorders you have.  Any surgeries you have had.  Any medical conditions you have.  Whether you are pregnant or may be pregnant. What are the risks? Generally, this is a safe procedure. However, problems may occur, including:  Bleeding.  Injury to a nerve near the injection site.  Pain at the injection site.  Weakness or numbness in areas controlled by nerves near the injection site.  Infection.  Temporary fluid retention.  Allergic reactions to medicines or dyes.  Injury to other structures or organs near the injection site. What happens before the procedure?  Follow instructions from your health care provider about eating or drinking  restrictions.  Ask your health care provider about:  Changing or stopping your regular medicines. This is especially important if you are taking diabetes medicines or blood thinners.  Taking medicines such as aspirin and ibuprofen. These medicines can thin your blood. Do not take these medicines before your procedure if your health care provider instructs you not to.  Do not take any new dietary supplements or medicines without asking your health care provider first.  Plan to have someone take you home after the procedure. What happens during the procedure?  You may need to remove your clothing and dress in an open-back gown.  The procedure will be done while you are lying on an X-ray table. You will most likely be asked to lie on your stomach, but you may be asked to lie in a different position if an injection will be made in your neck.  Machines will be used to monitor your oxygen levels, heart rate, and blood pressure.  If an injection will be made in your neck, an IV tube will be inserted into one of your veins. Fluids and medicine will flow directly into your body through the IV tube.  The area over the facet joint where the injection will be made will be cleaned with soap. The surrounding skin will be covered with clean drapes.  A numbing medicine (local anesthetic) will be applied to your skin. Your skin may sting or burn for a moment.  A video X-ray machine (fluoroscopy) will be used to locate the joint. In some cases, a CT scan may be used.  A contrast dye may be injected into the facet joint area to help locate the joint.  When the joint is located, an anesthetic will be injected into the joint through the needle.  Your health care provider will ask you whether you feel pain relief. If you do feel relief, a steroid may be injected to provide pain relief for a longer period of time. If you do not feel relief or feel only partial relief, additional injections of an anesthetic  may be made in other facet joints.  The needle will be removed.  Your skin will be cleaned.  A bandage (dressing) will be applied over each injection site. The procedure may vary among health care providers and hospitals. What happens after   the procedure?  You will be observed for 15-30 minutes before being allowed to go home. This information is not intended to replace advice given to you by your health care provider. Make sure you discuss any questions you have with your health care provider. Document Released: 10/06/2006 Document Revised: 06/18/2015 Document Reviewed: 02/10/2015 Elsevier Interactive Patient Education  2017 [employer]  

## 2016-07-28 NOTE — Progress Notes (Signed)
Safety precautions to be maintained throughout the outpatient stay will include: orient to surroundings, keep bed in low position, maintain call bell within reach at all times, provide assistance with transfer out of bed and ambulation.  

## 2016-08-05 LAB — TOXASSURE SELECT 13 (MW), URINE

## 2016-08-16 ENCOUNTER — Ambulatory Visit
Admission: RE | Admit: 2016-08-16 | Discharge: 2016-08-16 | Disposition: A | Discharge status: Home / Self Care | Payer: BC Managed Care – PPO | Source: Ambulatory Visit | Attending: Anesthesiology | Admitting: Anesthesiology

## 2016-08-16 ENCOUNTER — Other Ambulatory Visit: Payer: Self-pay | Admitting: Anesthesiology

## 2016-08-16 ENCOUNTER — Ambulatory Visit (HOSPITAL_BASED_OUTPATIENT_CLINIC_OR_DEPARTMENT_OTHER): Payer: BC Managed Care – PPO | Admitting: Anesthesiology

## 2016-08-16 ENCOUNTER — Encounter: Payer: Self-pay | Admitting: Anesthesiology

## 2016-08-16 DIAGNOSIS — Z803 Family history of malignant neoplasm of breast: Secondary | ICD-10-CM | POA: Insufficient documentation

## 2016-08-16 DIAGNOSIS — M5136 Other intervertebral disc degeneration, lumbar region: Secondary | ICD-10-CM | POA: Diagnosis not present

## 2016-08-16 DIAGNOSIS — R52 Pain, unspecified: Secondary | ICD-10-CM

## 2016-08-16 DIAGNOSIS — Z823 Family history of stroke: Secondary | ICD-10-CM | POA: Diagnosis not present

## 2016-08-16 DIAGNOSIS — Z0001 Encounter for general adult medical examination with abnormal findings: Secondary | ICD-10-CM | POA: Diagnosis not present

## 2016-08-16 DIAGNOSIS — Z90710 Acquired absence of both cervix and uterus: Secondary | ICD-10-CM | POA: Insufficient documentation

## 2016-08-16 DIAGNOSIS — M549 Dorsalgia, unspecified: Secondary | ICD-10-CM | POA: Insufficient documentation

## 2016-08-16 DIAGNOSIS — Z8249 Family history of ischemic heart disease and other diseases of the circulatory system: Secondary | ICD-10-CM | POA: Insufficient documentation

## 2016-08-16 DIAGNOSIS — Z87891 Personal history of nicotine dependence: Secondary | ICD-10-CM | POA: Insufficient documentation

## 2016-08-16 DIAGNOSIS — Z79899 Other long term (current) drug therapy: Secondary | ICD-10-CM | POA: Insufficient documentation

## 2016-08-16 DIAGNOSIS — M47816 Spondylosis without myelopathy or radiculopathy, lumbar region: Secondary | ICD-10-CM

## 2016-08-16 MED ORDER — TRIAMCINOLONE ACETONIDE 40 MG/ML IJ SUSP
40.0000 mg | Freq: Once | INTRAMUSCULAR | Status: AC
  Administered 2016-08-16: 40 mg

## 2016-08-16 MED ORDER — LACTATED RINGERS IV SOLN
1000.0000 mL | INTRAVENOUS | Status: DC
  Administered 2016-08-16: 1000 mL via INTRAVENOUS

## 2016-08-16 MED ORDER — SODIUM CHLORIDE 0.9% FLUSH: 10.0000 mL | Freq: Once | INTRAVENOUS | Status: DC

## 2016-08-16 MED ORDER — HYDROCODONE-ACETAMINOPHEN 5-325 MG PO TABS: 1.0000 | ORAL_TABLET | Freq: Two times a day (BID) | ORAL | 0 refills | Status: DC

## 2016-08-16 MED ORDER — FENTANYL CITRATE (PF) 100 MCG/2ML IJ SOLN
INTRAMUSCULAR | Status: AC
  Filled 2016-08-16: qty 2

## 2016-08-16 MED ORDER — TRIAMCINOLONE ACETONIDE 40 MG/ML IJ SUSP
INTRAMUSCULAR | Status: AC
Start: 2016-08-16 — End: 2016-08-16
  Filled 2016-08-16: qty 2

## 2016-08-16 MED ORDER — MIDAZOLAM HCL 5 MG/5ML IJ SOLN
INTRAMUSCULAR | Status: AC
  Filled 2016-08-16: qty 5

## 2016-08-16 MED ORDER — FENTANYL CITRATE (PF) 100 MCG/2ML IJ SOLN
100.0000 ug | Freq: Once | INTRAMUSCULAR | Status: AC
  Administered 2016-08-16: 100 ug via INTRAVENOUS

## 2016-08-16 MED ORDER — ROPIVACAINE HCL 5 MG/ML IJ SOLN
INTRAMUSCULAR | Status: AC
  Filled 2016-08-16: qty 20

## 2016-08-16 MED ORDER — LIDOCAINE HCL (PF) 1 % IJ SOLN: 5.0000 mL | Freq: Once | INTRAMUSCULAR | Status: DC

## 2016-08-16 MED ORDER — HYDROCODONE-ACETAMINOPHEN 5-325 MG PO TABS: 1.0000 | ORAL_TABLET | Freq: Two times a day (BID) | ORAL | 0 refills | Status: AC

## 2016-08-16 MED ORDER — MIDAZOLAM HCL 2 MG/2ML IJ SOLN
5.0000 mg | Freq: Once | INTRAMUSCULAR | Status: AC
  Administered 2016-08-16: 5 mg via INTRAVENOUS
  Filled 2016-08-16: qty 5

## 2016-08-16 MED ORDER — SODIUM CHLORIDE 0.9 % IJ SOLN
INTRAMUSCULAR | Status: AC
  Filled 2016-08-16: qty 10

## 2016-08-16 MED ORDER — ROPIVACAINE HCL 2 MG/ML IJ SOLN
10.0000 mL | Freq: Once | INTRAMUSCULAR | Status: DC
  Filled 2016-08-16: qty 10

## 2016-08-16 NOTE — Progress Notes (Signed)
Safety precautions to be maintained throughout the outpatient stay will include: orient to surroundings, keep bed in low position, maintain call bell within reach at all times, provide assistance with transfer out of bed and ambulation.  

## 2016-08-16 NOTE — Patient Instructions (Signed)
Pain Management Discharge Instructions  General Discharge Instructions :  If you need to reach your doctor call: Monday-Friday 8:00 am - 4:00 pm at [phone] or toll free [phone].  After clinic hours [phone] to have operator reach doctor.  Bring all of your medication bottles to all your appointments in the pain clinic.  To cancel or reschedule your appointment with Pain Management please remember to call 24 hours in advance to avoid a fee.  Refer to the educational materials which you have been given on: General Risks, I had my Procedure. Discharge Instructions, Post Sedation.  Post Procedure Instructions:  The drugs you were given will stay in your system until tomorrow, so for the next 24 hours you should not drive, make any legal decisions or drink any alcoholic beverages.  You may eat anything you prefer, but it is better to start with liquids then soups and crackers, and gradually work up to solid foods.  Please notify your doctor immediately if you have any unusual bleeding, trouble breathing or pain that is not related to your normal pain.  Depending on the type of procedure that was done, some parts of your body may feel week and/or numb.  This usually clears up by tonight or the next day.  Walk with the use of an assistive device or accompanied by an adult for the 24 hours.  You may use ice on the affected area for the first 24 hours.  Put ice in a Ziploc bag and cover with a towel and place against area 15 minutes on 15 minutes off.  You may switch to heat after 24 hours.Facet Joint Block The facet joints connect the bones of the spine (vertebrae). They make it possible for you to bend, twist, and make other movements with your spine. They also keep you from bending too far, twisting too far, and making other excessive movements. A facet joint block is a procedure where a numbing medicine (anesthetic) is injected into a facet joint. Often, a type of  anti-inflammatory medicine called a steroid is also injected. A facet joint block may be done to diagnose neck or back pain. If the pain gets better after a facet joint block, it means the pain is probably coming from the facet joint. If the pain does not get better, it means the pain is probably not coming from the facet joint. A facet joint block may also be done to relieve neck or back pain caused by an inflamed facet joint. A facet joint block is only done to relieve pain if the pain does not improve with other methods, such as medicine, exercise programs, and physical therapy. Tell a health care provider about:  Any allergies you have.  All medicines you are taking, including vitamins, herbs, eye drops, creams, and over-the-counter medicines.  Any problems you or family members have had with anesthetic medicines.  Any blood disorders you have.  Any surgeries you have had.  Any medical conditions you have.  Whether you are pregnant or may be pregnant. What are the risks? Generally, this is a safe procedure. However, problems may occur, including:  Bleeding.  Injury to a nerve near the injection site.  Pain at the injection site.  Weakness or numbness in areas controlled by nerves near the injection site.  Infection.  Temporary fluid retention.  Allergic reactions to medicines or dyes.  Injury to other structures or organs near the injection site. What happens before the procedure?  Follow instructions from your health care  provider about eating or drinking restrictions.  Ask your health care provider about:  Changing or stopping your regular medicines. This is especially important if you are taking diabetes medicines or blood thinners.  Taking medicines such as aspirin and ibuprofen. These medicines can thin your blood. Do not take these medicines before your procedure if your health care provider instructs you not to.  Do not take any new dietary supplements or  medicines without asking your health care provider first.  Plan to have someone take you home after the procedure. What happens during the procedure?  You may need to remove your clothing and dress in an open-back gown.  The procedure will be done while you are lying on an X-ray table. You will most likely be asked to lie on your stomach, but you may be asked to lie in a different position if an injection will be made in your neck.  Machines will be used to monitor your oxygen levels, heart rate, and blood pressure.  If an injection will be made in your neck, an IV tube will be inserted into one of your veins. Fluids and medicine will flow directly into your body through the IV tube.  The area over the facet joint where the injection will be made will be cleaned with soap. The surrounding skin will be covered with clean drapes.  A numbing medicine (local anesthetic) will be applied to your skin. Your skin may sting or burn for a moment.  A video X-ray machine (fluoroscopy) will be used to locate the joint. In some cases, a CT scan may be used.  A contrast dye may be injected into the facet joint area to help locate the joint.  When the joint is located, an anesthetic will be injected into the joint through the needle.  Your health care provider will ask you whether you feel pain relief. If you do feel relief, a steroid may be injected to provide pain relief for a longer period of time. If you do not feel relief or feel only partial relief, additional injections of an anesthetic may be made in other facet joints.  The needle will be removed.  Your skin will be cleaned.  A bandage (dressing) will be applied over each injection site. The procedure may vary among health care providers and hospitals. What happens after the procedure?  You will be observed for 15-30 minutes before being allowed to go home. This information is not intended to replace advice given to you by your health care  provider. Make sure you discuss any questions you have with your health care provider. Document Released: 10/06/2006 Document Revised: 06/18/2015 Document Reviewed: 02/10/2015 Elsevier Interactive Patient Education  2017 [employer].

## 2016-08-16 NOTE — Progress Notes (Signed)
Subjective:  Patient ID: Katherine Lester, female    DOB: 10-05-1955  Age: 61 y.o. MRN: [id number]  CC: Back Pain (lower bilateral)   Service Provided on Last Visit: Med Refill PROCEDURE:Bilateral L3-4 L4-5 L5-S1 and S1 medial branch block to the facet nerves of the lumbar spine under fluoroscopic guidance with moderate sedation  HPI Katherine Lester presents for reevaluation today. She was last seen 1 month ago and desires to proceed with a lumbar facet block. She's had these in the past and they generally give her several months of significant improvement in her low back pain. She describes persistent lumbar pain with radiation into the posterior buttock and posterior leg. This is a similar pain to what she has had facet blocks in the past and where she has experienced significant improvement. Otherwise she is in her usual state of health today with no new contributory changes. Her bowel bladder function and lower extremity strength and function have been at baseline. She is tolerating her medications well and we have reviewed the [facility] practitioner database and it is appropriate.   History Jouri has a past medical history of Allergy; Anxiety; Chronic back pain; Depression; Generalized edema; GERD (gastroesophageal reflux disease); IBS (irritable bowel syndrome); Insomnia; Irritable bowel syndrome; Positional vertigo; Sinusitis; and Spastic colon.   She has a past surgical history that includes Abdominal hysterectomy; Appendectomy; Fracture surgery (Left); Dilation and curettage of uterus; and Breast biopsy (Right).   Her family history includes Alcohol abuse in her brother; COPD in her mother; Cancer in her mother; Depression in her mother; Drug abuse in her brother; Heart disease in her maternal grandmother and mother; Mental illness in her brother; Stroke in her mother.She reports that she quit smoking about 2 years ago. She quit after 20.00 years of use. She has never used  smokeless tobacco. She reports that she does not drink alcohol or use drugs.  Results for orders placed in visit on 01/07/14  [facility] W/O Cm   Narrative * PRIOR REPORT IMPORTED FROM AN EXTERNAL SYSTEM *   CLINICAL DATA:  Low back, right leg and right hip pain.   EXAM:  MRI LUMBAR SPINE WITHOUT CONTRAST   TECHNIQUE:  Multiplanar, multisequence MR imaging of the lumbar spine was  performed. No intravenous contrast was administered.   COMPARISON:  None.   FINDINGS:  Vertebral body height, signal and alignment are maintained.  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 The conus medullaris is normal in signal  and position. Imaged intra-abdominal contents are unremarkable.   The T11-12 and T12-L1 levels are imaged in the sagittal plane only  and negative.   L1-2:  Negative.   L2-3:  Negative.   L3-4:  Negative.   L4-5: Minimal disc bulge without central canal or foraminal  narrowing.   L5-S1: Minimal disc bulge without central canal or foraminal  narrowing. 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 No nerve root compression is identified.    Electronically Signed    By: Inge Rise M.D.    On: 01/07/2014 10:46        ToxAssure Select 13  Date Value Ref Range Status  07/28/2016 FINAL  Final    Comment:    ==================================================================== TOXASSURE SELECT 13 (MW) ==================================================================== Test  Result       Flag       Units Drug Present and Declared for Prescription Verification   Hydrocodone                    173          EXPECTED   ng/mg creat   Norhydrocodone                 314          EXPECTED   ng/mg creat    Sources of hydrocodone  include scheduled prescription    medications. Norhydrocodone is an expected metabolite of    hydrocodone. Drug Absent but Declared for Prescription Verification   Codeine                        Not Detected UNEXPECTED ng/mg creat ==================================================================== Test                      Result    Flag   Units      Ref Range   Creatinine              146              mg/dL      >=20 ==================================================================== Declared Medications:  The flagging and interpretation on this report are based on the  following declared medications.  Unexpected results may arise from  inaccuracies in the declared medications.  **Note: The testing scope of this panel includes these medications:  Codeine (Cheratussin AC)  Hydrocodone  Hydrocodone (Hydrocodone-Acetaminophen)  **Note: The testing scope of this panel does not include following  reported medications:  Acetaminophen (Hydrocodone-Acetaminophen)  Amoxicillin  Amoxicillin (Augmentin)  Clavulinate (Augmentin)  Cyclobenzaprine  Diclofenac (Flector)  Diclofenac (Voltaren)  Docusate (Colace)  Docusate (Senokot-S)  Doxycycline  Duloxetine  Guaifenesin (Cheratussin AC)  Omeprazole (Nexium)  Sennosides (Senokot-S)  Sodium Chloride  Triamcinolone (Kenalog)  Vitamin D2 (Ergocalciferol)  Vitamin D3  Zolpidem (Ambien) ==================================================================== For clinical consultation, please call [phone]. ====================================================================     Outpatient Medications Prior to Visit  Medication Sig Dispense Refill  . Cholecalciferol (VITAMIN D3) 10000 UNITS capsule Take 50,000 Units by mouth once a week.     . cyclobenzaprine (FLEXERIL) 10 MG tablet Limit 1 tab by mouth 2-4 times a day if tolerated 100 tablet 0  . diclofenac sodium (VOLTAREN) 1 % GEL Apply topically.    . docusate sodium (COLACE) 100  MG capsule Take 100 mg by mouth at bedtime.     . DULoxetine (CYMBALTA) 20 MG capsule Limit 1 capsule by mouth per day if tolerated 30 capsule 0  . esomeprazole (NEXIUM) 40 MG capsule Take 40 mg by mouth daily at 12 noon.    . senna (SENOKOT) 8.6 MG tablet Take by mouth.    . zolpidem (AMBIEN) 5 MG tablet Take 5 mg by mouth at bedtime.    Marland Kitchen HYDROcodone-acetaminophen (NORCO/VICODIN) 5-325 MG tablet Limit 1/2 - 1 tablet by mouth daily or twice per day if tolerated 40 tablet 0  . HYDROcodone-acetaminophen (NORCO/VICODIN) 5-325 MG tablet Take 1 tablet by mouth 2 (two) times daily. 45 tablet 0  . amoxicillin (AMOXIL) 500 MG capsule     . amoxicillin-clavulanate (AUGMENTIN) 875-125 MG tablet     . CHERATUSSIN AC 100-10 MG/5ML syrup     . cyclobenzaprine (FLEXERIL) 10 MG tablet Take by mouth.    . diclofenac (FLECTOR) 1.3 %  PTCH Apply patch or a portion of patch to painful area of skin twice per day if tolerated (Patient not taking: Reported on 07/28/2016) 60 patch 0  . doxycycline (VIBRA-TABS) 100 MG tablet     . DULoxetine (CYMBALTA) 20 MG capsule Take by mouth.    . esomeprazole (NEXIUM) 40 MG capsule Take by mouth.    Marland Kitchen HYDROcodone-homatropine (HYCODAN) 5-1.5 MG/5ML syrup     . KENALOG 40 MG/ML injection     . sodium chloride 0.9 % injection     . Vitamin D, Ergocalciferol, (DRISDOL) 50000 units CAPS capsule Take by mouth.    . zolpidem (AMBIEN) 10 MG tablet Take by mouth at bedtime.      Facility-Administered Medications Prior to Visit  Medication Dose Route Frequency Provider Last Rate Last Dose  . bupivacaine (PF) (MARCAINE) 0.25 % injection 30 mL  30 mL Other Once Mohammed Kindle, MD      . bupivacaine (PF) (MARCAINE) 0.25 % injection 30 mL  30 mL Other Once Mohammed Kindle, MD      . bupivacaine (PF) (MARCAINE) 0.25 % injection 30 mL  30 mL Other Once Mohammed Kindle, MD      . bupivacaine (PF) (MARCAINE) 0.25 % injection 30 mL  30 mL Other Once Mohammed Kindle, MD      . fentaNYL (SUBLIMAZE)  injection 100 mcg  100 mcg Intravenous Once Mohammed Kindle, MD      . fentaNYL (SUBLIMAZE) injection 100 mcg  100 mcg Intravenous Once Mohammed Kindle, MD      . lactated ringers infusion 1,000 mL  1,000 mL Intravenous Continuous Mohammed Kindle, MD      . lactated ringers infusion 1,000 mL  1,000 mL Intravenous Continuous Mohammed Kindle, MD      . lactated ringers infusion 1,000 mL  1,000 mL Intravenous Continuous Mohammed Kindle, MD 125 mL/hr at 10/15/15 0923 1,000 mL at 10/15/15 0923  . lactated ringers infusion 1,000 mL  1,000 mL Intravenous Continuous Mohammed Kindle, MD 125 mL/hr at 01/12/16 0950 1,000 mL at 01/12/16 0950  . midazolam (VERSED) 5 MG/5ML injection 5 mg  5 mg Intravenous Once Mohammed Kindle, MD      . midazolam (VERSED) 5 MG/5ML injection 5 mg  5 mg Intravenous Once Mohammed Kindle, MD      . orphenadrine (NORFLEX) injection 60 mg  60 mg Intramuscular Once Mohammed Kindle, MD      . orphenadrine (NORFLEX) injection 60 mg  60 mg Intramuscular Once Mohammed Kindle, MD      . orphenadrine (NORFLEX) injection 60 mg  60 mg Intramuscular Once Mohammed Kindle, MD      . orphenadrine (NORFLEX) injection 60 mg  60 mg Intramuscular Once Mohammed Kindle, MD      . sodium chloride flush (NS) 0.9 % injection 20 mL  20 mL Other Once Mohammed Kindle, MD      . sodium chloride flush (NS) 0.9 % injection 20 mL  20 mL Other Once Mohammed Kindle, MD      . triamcinolone acetonide (KENALOG-40) injection 40 mg  40 mg Other Once Mohammed Kindle, MD      . triamcinolone acetonide (KENALOG-40) injection 40 mg  40 mg Other Once Mohammed Kindle, MD      . triamcinolone acetonide (KENALOG-40) injection 40 mg  40 mg Other Once Mohammed Kindle, MD      . triamcinolone acetonide (KENALOG-40) injection 40 mg  40 mg Other Once Mohammed Kindle, MD      . triamcinolone acetonide (  KENALOG-40) injection 40 mg  40 mg Other Once Mohammed Kindle, MD       No results found for: WBC, HGB, HCT, PLT, GLUCOSE, CHOL, TRIG, HDL, LDLDIRECT, LDLCALC, ALT,  AST, NA, K, CL, CREATININE, BUN, CO2, TSH, PSA, INR, GLUF, HGBA1C, MICROALBUR  --------------------------------------------------------------------------------------------------------------------- Mm Digital Screening Bilateral  Result Date: 04/07/2016 CLINICAL DATA:  Screening. EXAM: DIGITAL SCREENING BILATERAL MAMMOGRAM WITH CAD COMPARISON:  Previous exam(s). ACR Breast Density Category b: There are scattered areas of fibroglandular density. FINDINGS: There are no findings suspicious for malignancy. Images were processed with CAD. IMPRESSION: No mammographic evidence of malignancy. A result letter of this screening mammogram will be mailed directly to the patient. RECOMMENDATION: Screening mammogram in one year. (Code:SM-B-01Y) BI-RADS CATEGORY  1: Negative. Electronically Signed   By: Nolon Nations M.D.   On: 04/07/2016 09:57       ---------------------------------------------------------------------------------------------------------------------- Past Medical History:  Diagnosis Date  . Allergy   . Anxiety   . Chronic back pain   . Depression   . Generalized edema   . GERD (gastroesophageal reflux disease)   . IBS (irritable bowel syndrome)   . Insomnia   . Irritable bowel syndrome   . Positional vertigo   . Sinusitis   . Spastic colon     Past Surgical History:  Procedure Laterality Date  . ABDOMINAL HYSTERECTOMY    . APPENDECTOMY    . BREAST BIOPSY Right    stereo bx-neg  . DILATION AND CURETTAGE OF UTERUS    . FRACTURE SURGERY Left    arm    Family History  Problem Relation Age of Onset  . Cancer Mother   . COPD Mother   . Depression Mother   . Heart disease Mother   . Stroke Mother   . Drug abuse Brother   . Alcohol abuse Brother   . Mental illness Brother   . Heart disease Maternal Grandmother   . Breast cancer Neg Hx     Social History  Substance Use Topics  . Smoking status: Former Smoker    Years: 20.00    Quit date: 10/14/2013  . Smokeless  tobacco: Never Used  . Alcohol use No    ---------------------------------------------------------------------------------------------------------------------  Scheduled Meds: . lidocaine (PF)  5 mL Subcutaneous Once  . ropivacaine (PF) 2 mg/mL (0.2%)  10 mL Epidural Once  . sodium chloride flush  10 mL Other Once   Continuous Infusions: . lactated ringers 1,000 mL (08/16/16 1444)   PRN Meds:.   BP 119/81   Pulse 85   Temp 97.9 F (36.6 C) (Oral)   Resp 18   Ht 5\' 6"  (1.676 m)   Wt 178 lb (80.7 kg)   SpO2 100%   BMI 28.73 kg/m    BP Readings from Last 3 Encounters:  08/16/16 119/81  07/28/16 118/83  02/09/16 131/78     Wt Readings from Last 3 Encounters:  08/16/16 178 lb (80.7 kg)  07/28/16 174 lb (78.9 kg)  02/09/16 172 lb (78 kg)     ----------------------------------------------------------------------------------------------------------------------  ROS Review of Systems  Cardiac: No angina Psychiatric: No history of drug abuse or suicidal or homicidal ideation. GI no constipation  Objective:  BP 119/81   Pulse 85   Temp 97.9 F (36.6 C) (Oral)   Resp 18   Ht 5\' 6"  (1.676 m)   Wt 178 lb (80.7 kg)   SpO2 100%   BMI 28.73 kg/m   Physical Exam Pupils are equally round reactive to light extraocular muscles are  intact asked line heart is regular rate and rhythm without murmur Lungs are clear to auscultation Inspection of her low back reveals some mild paraspinous muscle tenderness and a pain that is reproduced with extension And motor strength is at baseline     Assessment & Plan:   [place] was seen today for back pain.  Diagnoses and all orders for this visit:  Degenerative disc disease, lumbar -     LUMBAR FACET(MEDIAL BRANCH NERVE BLOCK) MBNB  Facet syndrome, lumbar -     LUMBAR FACET(MEDIAL BRANCH NERVE BLOCK) MBNB -     triamcinolone acetonide (KENALOG-40) injection 40 mg; 1 mL (40 mg total) by Other route once. -     sodium  chloride flush (NS) 0.9 % injection 10 mL; 10 mLs by Other route once. -     ropivacaine (PF) 2 mg/mL (0.2%) (NAROPIN) injection 10 mL; 10 mLs by Epidural route once. -     midazolam (VERSED) injection 5 mg; Inject 5 mLs (5 mg total) into the vein once. -     lidocaine (PF) (XYLOCAINE) 1 % injection 5 mL; Inject 5 mLs into the skin once. -     lactated ringers infusion 1,000 mL; Inject 1,000 mLs into the vein continuous. -     fentaNYL (SUBLIMAZE) injection 100 mcg; Inject 2 mLs (100 mcg total) into the vein once. -     LUMBAR FACET(MEDIAL BRANCH NERVE BLOCK) MBNB; Future  Other orders -     Discontinue: HYDROcodone-acetaminophen (NORCO/VICODIN) 5-325 MG tablet; Take 1 tablet by mouth 2 (two) times daily. -     Discontinue: HYDROcodone-acetaminophen (NORCO/VICODIN) 5-325 MG tablet; Take 1 tablet by mouth 2 (two) times daily. -     HYDROcodone-acetaminophen (NORCO/VICODIN) 5-325 MG tablet; Take 1 tablet by mouth 2 (two) times daily.     ----------------------------------------------------------------------------------------------------------------------  Problem List Items Addressed This Visit      Musculoskeletal and Integument   Degenerative disc disease, lumbar   Relevant Medications   triamcinolone acetonide (KENALOG-40) injection 40 mg (Completed)   fentaNYL (SUBLIMAZE) injection 100 mcg (Completed)   HYDROcodone-acetaminophen (NORCO/VICODIN) 5-325 MG tablet   Facet syndrome, lumbar   Relevant Medications   triamcinolone acetonide (KENALOG-40) injection 40 mg (Completed)   sodium chloride flush (NS) 0.9 % injection 10 mL   ropivacaine (PF) 2 mg/mL (0.2%) (NAROPIN) injection 10 mL   midazolam (VERSED) injection 5 mg (Completed)   lidocaine (PF) (XYLOCAINE) 1 % injection 5 mL   lactated ringers infusion 1,000 mL   fentaNYL (SUBLIMAZE) injection 100 mcg (Completed)   HYDROcodone-acetaminophen (NORCO/VICODIN) 5-325 MG tablet   Other Relevant Orders   LUMBAR FACET(MEDIAL BRANCH  NERVE BLOCK) MBNB      ----------------------------------------------------------------------------------------------------------------------  1. Degenerative disc disease, lumbar Continue with back stretching strengthening exercises as reviewed with her today and aerobic conditioning.   2. Facet syndrome, lumbar We will proceed with a bilateral lumbar facet block today. The risks and benefits of an once again reviewed all questions answered to made she is to return to clinic in 2 months for reevaluation 3. Chronic bilateral low back pain without sciatica We will refill her medications for the next 2 months as well with refills given for 401 and May 01 she is tolerating these well and based on the narcotic assessment sheet continues to derive good lifestyle improvement and function with the medications.. I have reviewed the [facility] practitioner database information and it is appropriate.  - ToxASSURE Select 13 (MW), Urine    ----------------------------------------------------------------------------------------------------------------------  I  am having Ms. Lovena Le maintain her Vitamin D3, zolpidem, esomeprazole, diclofenac, cyclobenzaprine, DULoxetine, amoxicillin, amoxicillin-clavulanate, diclofenac sodium, docusate sodium, doxycycline, Vitamin D (Ergocalciferol), CHERATUSSIN AC, HYDROcodone-homatropine, senna, sodium chloride, KENALOG, zolpidem, cyclobenzaprine, DULoxetine, esomeprazole, and HYDROcodone-acetaminophen. We administered triamcinolone acetonide, midazolam, lactated ringers, and fentaNYL. We will continue to administer bupivacaine (PF), fentaNYL, lactated ringers, midazolam, orphenadrine, triamcinolone acetonide, bupivacaine (PF), fentaNYL, lactated ringers, midazolam, orphenadrine, triamcinolone acetonide, bupivacaine (PF), orphenadrine, triamcinolone acetonide, lactated ringers, sodium chloride flush, triamcinolone acetonide, lactated ringers, bupivacaine (PF),  orphenadrine, triamcinolone acetonide, and sodium chloride flush.   Meds ordered this encounter  Medications  . triamcinolone acetonide (KENALOG-40) injection 40 mg  . sodium chloride flush (NS) 0.9 % injection 10 mL  . ropivacaine (PF) 2 mg/mL (0.2%) (NAROPIN) injection 10 mL  . midazolam (VERSED) injection 5 mg  . lidocaine (PF) (XYLOCAINE) 1 % injection 5 mL  . lactated ringers infusion 1,000 mL  . fentaNYL (SUBLIMAZE) injection 100 mcg  . DISCONTD: HYDROcodone-acetaminophen (NORCO/VICODIN) 5-325 MG tablet    Sig: Take 1 tablet by mouth 2 (two) times daily.    Dispense:  45 tablet    Refill:  0    Do not fill [id number]  . DISCONTD: HYDROcodone-acetaminophen (NORCO/VICODIN) 5-325 MG tablet    Sig: Take 1 tablet by mouth 2 (two) times daily.    Dispense:  45 tablet    Refill:  0    Do not fill [id number]  . HYDROcodone-acetaminophen (NORCO/VICODIN) 5-325 MG tablet    Sig: Take 1 tablet by mouth 2 (two) times daily.    Dispense:  45 tablet    Refill:  0    Do not fill [id number]    Procedure: L3-4 L4-5 L5-S1 and S1 medial branch block to the facets under fluoroscopic guidance with moderate sedation   Procedure: Bilateral lumbar medial branch block under fluoroscopic guidance at L3-4 L4-5 L5-S1 with sedation Patient was taken to the fluoroscopy suite and placed in the prone position. A total dose of 30mg  of Versed with 1 cc of fentanyl was titrated for moderate sedation. Vital signs were stable throughout the procedure. The  overlying area of skin on the  left side was prepped with Betadine 3 and strict aseptic technique was utilized throughout the procedure. Flouroscopy was used to  identify the areas overlying the aforementioned facets at  L3-4 or L4-5 and  L5-S1. 1% lidocaine 1 cc was infiltrated subcutaneously and into the fascia with a 25-gauge needle at each of these sites. I then advanced a 22-gauge 3-1/2 inch Quinckie needle with the needle tip to lie at the "[facility]" portion  of the [employer] dog". There was negative aspiration for heme or CSF and  no paresthesia. I then injected 2 cc of ropivacaine 0.2% mixed with 10 mg of triamcinolone at each of the aforementioned sites. These needles were withdrawn and the L5-S1 needle was redirected towards the S1 posterior foramen. This was once again performed with no paresthesia, negative aspiration and 2 cc of this same mixture was injected at this site.   On the opposite side, I then injected 1% lidocaine as above and advanced 22-gauge Quinky needles as before to the [place] eye portion of the [employer] dog". These needles were placed without paresthesia and with strict aseptic technique. As before I injected 2 cc of ropivacaine .2% mixed with 10 mg triamcinolone at each site without evidence of IV or subarachnoid symptoms. These needles were withdrawn and the L5-S1 needle was redirected towards the S1 posterior foramen and advanced  without paresthesia with negative aspiration. Once again, I injected 2 cc of the same mixture at that site and the patient was convalesced discharged home in stable condition  Dr. Vashti Hey M.D.   Follow-up: Return in about 3 months (around 11/16/2016) for procedure.    Molli Barrows, MD 3:58 PM  The [place] practitioner database for opioid medications on this patient has been reviewed by me and my staff   Greater than 50% of the total encounter time was spent in counseling and / or coordination of care.     This dictation was performed utilizing Systems analyst.  Please excuse any unintentional or mistaken typographical errors as a result.

## 2016-08-17 ENCOUNTER — Telehealth: Payer: Self-pay | Admitting: *Deleted

## 2016-08-17 NOTE — Telephone Encounter (Signed)
Denies problems post procedure. 

## 2016-10-14 ENCOUNTER — Ambulatory Visit: Payer: BC Managed Care – PPO | Attending: Anesthesiology | Admitting: Anesthesiology

## 2016-10-14 ENCOUNTER — Encounter: Payer: Self-pay | Admitting: Anesthesiology

## 2016-10-14 VITALS — BP 131/85 | HR 92 | Temp 98.5°F | Resp 18 | Ht 66.0 in | Wt 176.0 lb

## 2016-10-14 DIAGNOSIS — Z823 Family history of stroke: Secondary | ICD-10-CM | POA: Insufficient documentation

## 2016-10-14 DIAGNOSIS — F419 Anxiety disorder, unspecified: Secondary | ICD-10-CM | POA: Insufficient documentation

## 2016-10-14 DIAGNOSIS — F329 Major depressive disorder, single episode, unspecified: Secondary | ICD-10-CM | POA: Insufficient documentation

## 2016-10-14 DIAGNOSIS — Z825 Family history of asthma and other chronic lower respiratory diseases: Secondary | ICD-10-CM | POA: Diagnosis not present

## 2016-10-14 DIAGNOSIS — J329 Chronic sinusitis, unspecified: Secondary | ICD-10-CM | POA: Insufficient documentation

## 2016-10-14 DIAGNOSIS — K589 Irritable bowel syndrome without diarrhea: Secondary | ICD-10-CM | POA: Diagnosis not present

## 2016-10-14 DIAGNOSIS — Z87891 Personal history of nicotine dependence: Secondary | ICD-10-CM | POA: Diagnosis not present

## 2016-10-14 DIAGNOSIS — Z811 Family history of alcohol abuse and dependence: Secondary | ICD-10-CM | POA: Insufficient documentation

## 2016-10-14 DIAGNOSIS — M47816 Spondylosis without myelopathy or radiculopathy, lumbar region: Secondary | ICD-10-CM

## 2016-10-14 DIAGNOSIS — R42 Dizziness and giddiness: Secondary | ICD-10-CM | POA: Insufficient documentation

## 2016-10-14 DIAGNOSIS — Z818 Family history of other mental and behavioral disorders: Secondary | ICD-10-CM | POA: Diagnosis not present

## 2016-10-14 DIAGNOSIS — Z803 Family history of malignant neoplasm of breast: Secondary | ICD-10-CM | POA: Insufficient documentation

## 2016-10-14 DIAGNOSIS — Z79899 Other long term (current) drug therapy: Secondary | ICD-10-CM | POA: Diagnosis not present

## 2016-10-14 DIAGNOSIS — M4696 Unspecified inflammatory spondylopathy, lumbar region: Secondary | ICD-10-CM

## 2016-10-14 DIAGNOSIS — M5136 Other intervertebral disc degeneration, lumbar region: Secondary | ICD-10-CM | POA: Insufficient documentation

## 2016-10-14 DIAGNOSIS — K219 Gastro-esophageal reflux disease without esophagitis: Secondary | ICD-10-CM | POA: Insufficient documentation

## 2016-10-14 DIAGNOSIS — R601 Generalized edema: Secondary | ICD-10-CM | POA: Diagnosis not present

## 2016-10-14 DIAGNOSIS — Z90710 Acquired absence of both cervix and uterus: Secondary | ICD-10-CM | POA: Diagnosis not present

## 2016-10-14 DIAGNOSIS — Z8249 Family history of ischemic heart disease and other diseases of the circulatory system: Secondary | ICD-10-CM | POA: Diagnosis not present

## 2016-10-14 DIAGNOSIS — Z9889 Other specified postprocedural states: Secondary | ICD-10-CM | POA: Insufficient documentation

## 2016-10-14 DIAGNOSIS — G8929 Other chronic pain: Secondary | ICD-10-CM | POA: Insufficient documentation

## 2016-10-14 DIAGNOSIS — M545 Low back pain: Secondary | ICD-10-CM | POA: Diagnosis not present

## 2016-10-14 MED ORDER — HYDROCODONE-ACETAMINOPHEN 5-325 MG PO TABS: 1.0000 | ORAL_TABLET | Freq: Four times a day (QID) | ORAL | 0 refills | Status: DC | PRN

## 2016-10-14 NOTE — Progress Notes (Signed)
Nursing Pain Medication Assessment:  Safety precautions to be maintained throughout the outpatient stay will include: orient to surroundings, keep bed in low position, maintain call bell within reach at all times, provide assistance with transfer out of bed and ambulation.  Medication Inspection Compliance: Pill count conducted under aseptic conditions, in front of the patient. Neither the pills nor the bottle was removed from the patient's sight at any time. Once count was completed pills were immediately returned to the patient in their original bottle.  Medication: Hydrocodone/APAP Pill/Patch Count: 45 of 45 pills remain Pill/Patch Appearance: Markings consistent with prescribed medication Bottle Appearance: Standard pharmacy container. Clearly labeled. Filled Date: 05 /15 / 2018 Last Medication intake:  Katherine Lester

## 2016-10-14 NOTE — Progress Notes (Signed)
Subjective:  Patient ID: Katherine Lester, female    DOB: 01-13-1956  Age: 61 y.o. MRN: [id number]  CC: Back Pain (low)   Service Provided on Last Visit: Procedure PROCEDURE:None Previous procedure Bilateral L3-4 L4-5 L5-S1 and S1 medial branch block to the facet nerves of the lumbar spine under fluoroscopic guidance with moderate sedation  HPI Katherine Lester presents for reevaluation last seen in March. At that time she had a bilateral facet block and did quite well with this. She states she got excellent relief lasting several weeks and desires to proceed with a repeat injection in the near future but secondary to time constraints cannot do this today. She has done well with the injections with a greater than 50% reduction in pain lasting several weeks. She has tolerated her medications well with no issues of diverting or illicit use. Furthermore we have reviewed the [facility] practitioner database and it is appropriate. I've also reviewed her narcotic assessment sheet and she appears to be deriving good relief with the medications.  History Deva has a past medical history of Allergy; Anxiety; Chronic back pain; Depression; Generalized edema; GERD (gastroesophageal reflux disease); IBS (irritable bowel syndrome); Insomnia; Irritable bowel syndrome; Positional vertigo; Sinusitis; and Spastic colon.   She has a past surgical history that includes Abdominal hysterectomy; Appendectomy; Fracture surgery (Left); Dilation and curettage of uterus; and Breast biopsy (Right).   Her family history includes Alcohol abuse in her brother; COPD in her mother; Cancer in her mother; Depression in her mother; Drug abuse in her brother; Heart disease in her maternal grandmother and mother; Mental illness in her brother; Stroke in her mother.She reports that she quit smoking about 3 years ago. She quit after 20.00 years of use. She has never used smokeless tobacco. She reports that she does not drink  alcohol or use drugs.  Results for orders placed in visit on 01/07/14  [place] [facility]   Narrative * PRIOR REPORT IMPORTED FROM AN EXTERNAL SYSTEM *   CLINICAL DATA:  Low back, right leg and right hip pain.   EXAM:  MRI LUMBAR SPINE WITHOUT CONTRAST   TECHNIQUE:  Multiplanar, multisequence MR imaging of the lumbar spine was  performed. No intravenous contrast was administered.   COMPARISON:  None.   FINDINGS:  Vertebral body height, signal and alignment are maintained.  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 The conus medullaris is normal in signal  and position. Imaged intra-abdominal contents are unremarkable.   The T11-12 and T12-L1 levels are imaged in the sagittal plane only  and negative.   L1-2:  Negative.   L2-3:  Negative.   L3-4:  Negative.   L4-5: Minimal disc bulge without central canal or foraminal  narrowing.   L5-S1: Minimal disc bulge without central canal or foraminal  narrowing. 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 No nerve root compression is identified.    Electronically Signed    By: Inge Rise M.D.    On: 01/07/2014 10:46        Tox[employer] Select 13  Date Value Ref Range Status  07/28/2016 FINAL  Final    Comment:    ==================================================================== [facility] SELECT 13 (MW) ==================================================================== Test  Result       Flag       Units Drug Present and Declared for Prescription Verification   Hydrocodone                    173          EXPECTED   ng/mg creat   Norhydrocodone                 314          EXPECTED   ng/mg creat    Sources of hydrocodone include scheduled prescription    medications.  Norhydrocodone is an expected metabolite of    hydrocodone. Drug Absent but Declared for Prescription Verification   Codeine                        Not Detected UNEXPECTED ng/mg creat ==================================================================== Test                      Result    Flag   Units      Ref Range   Creatinine              146              mg/dL      >=20 ==================================================================== Declared Medications:  The flagging and interpretation on this report are based on the  following declared medications.  Unexpected results may arise from  inaccuracies in the declared medications.  **Note: The testing scope of this panel includes these medications:  Codeine (Cheratussin AC)  Hydrocodone  Hydrocodone (Hydrocodone-Acetaminophen)  **Note: The testing scope of this panel does not include following  reported medications:  Acetaminophen (Hydrocodone-Acetaminophen)  Amoxicillin  Amoxicillin (Augmentin)  Clavulinate (Augmentin)  Cyclobenzaprine  Diclofenac (Flector)  Diclofenac (Voltaren)  Docusate (Colace)  Docusate (Senokot-S)  Doxycycline  Duloxetine  Guaifenesin (Cheratussin AC)  Omeprazole (Nexium)  Sennosides (Senokot-S)  Sodium Chloride  Triamcinolone (Kenalog)  Vitamin D2 (Ergocalciferol)  Vitamin D3  Zolpidem (Ambien) ==================================================================== For clinical consultation, please call [phone]. ====================================================================     Outpatient Medications Prior to Visit  Medication Sig Dispense Refill  . Cholecalciferol (VITAMIN D3) 10000 UNITS capsule Take 50,000 Units by mouth once a week.     . cyclobenzaprine (FLEXERIL) 10 MG tablet Limit 1 tab by mouth 2-4 times a day if tolerated 100 tablet 0  . docusate sodium (COLACE) 100 MG capsule Take 100 mg by mouth at bedtime.     . DULoxetine (CYMBALTA) 20 MG capsule Limit 1 capsule by  mouth per day if tolerated 30 capsule 0  . zolpidem (AMBIEN) 5 MG tablet Take 5 mg by mouth at bedtime.    Marland Kitchen amoxicillin (AMOXIL) 500 MG capsule     . amoxicillin-clavulanate (AUGMENTIN) 875-125 MG tablet     . CHERATUSSIN AC 100-10 MG/5ML syrup     . cyclobenzaprine (FLEXERIL) 10 MG tablet Take by mouth.    . diclofenac (FLECTOR) 1.3 % PTCH Apply patch or a portion of patch to painful area of skin twice per day if tolerated (Patient not taking: Reported on 07/28/2016) 60 patch 0  . diclofenac sodium (VOLTAREN) 1 % GEL Apply topically.    Marland Kitchen doxycycline (VIBRA-TABS) 100 MG tablet     . DULoxetine (CYMBALTA) 20 MG capsule Take by mouth.    . esomeprazole (NEXIUM) 40 MG capsule Take 40 mg by mouth daily at 12 noon.    Marland Kitchen esomeprazole (NEXIUM) 40  MG capsule Take by mouth.    Marland Kitchen HYDROcodone-homatropine (HYCODAN) 5-1.5 MG/5ML syrup     . KENALOG 40 MG/ML injection     . senna (SENOKOT) 8.6 MG tablet Take by mouth.    . sodium chloride 0.9 % injection     . Vitamin D, Ergocalciferol, (DRISDOL) 50000 units CAPS capsule Take by mouth.    . zolpidem (AMBIEN) 10 MG tablet Take by mouth at bedtime.      Facility-Administered Medications Prior to Visit  Medication Dose Route Frequency Provider Last Rate Last Dose  . bupivacaine (PF) (MARCAINE) 0.25 % injection 30 mL  30 mL Other Once Mohammed Kindle, MD      . bupivacaine (PF) (MARCAINE) 0.25 % injection 30 mL  30 mL Other Once Mohammed Kindle, MD      . bupivacaine (PF) (MARCAINE) 0.25 % injection 30 mL  30 mL Other Once Mohammed Kindle, MD      . bupivacaine (PF) (MARCAINE) 0.25 % injection 30 mL  30 mL Other Once Mohammed Kindle, MD      . fentaNYL (SUBLIMAZE) injection 100 mcg  100 mcg Intravenous Once Mohammed Kindle, MD      . fentaNYL (SUBLIMAZE) injection 100 mcg  100 mcg Intravenous Once Mohammed Kindle, MD      . lactated ringers infusion 1,000 mL  1,000 mL Intravenous Continuous Mohammed Kindle, MD      . lactated ringers infusion 1,000 mL  1,000 mL  Intravenous Continuous Mohammed Kindle, MD      . lactated ringers infusion 1,000 mL  1,000 mL Intravenous Continuous Mohammed Kindle, MD 125 mL/hr at 10/15/15 0923 1,000 mL at 10/15/15 0923  . lactated ringers infusion 1,000 mL  1,000 mL Intravenous Continuous Mohammed Kindle, MD 125 mL/hr at 01/12/16 0950 1,000 mL at 01/12/16 0950  . midazolam (VERSED) 5 MG/5ML injection 5 mg  5 mg Intravenous Once Mohammed Kindle, MD      . midazolam (VERSED) 5 MG/5ML injection 5 mg  5 mg Intravenous Once Mohammed Kindle, MD      . orphenadrine (NORFLEX) injection 60 mg  60 mg Intramuscular Once Mohammed Kindle, MD      . orphenadrine (NORFLEX) injection 60 mg  60 mg Intramuscular Once Mohammed Kindle, MD      . orphenadrine (NORFLEX) injection 60 mg  60 mg Intramuscular Once Mohammed Kindle, MD      . orphenadrine (NORFLEX) injection 60 mg  60 mg Intramuscular Once Mohammed Kindle, MD      . sodium chloride flush (NS) 0.9 % injection 20 mL  20 mL Other Once Mohammed Kindle, MD      . sodium chloride flush (NS) 0.9 % injection 20 mL  20 mL Other Once Mohammed Kindle, MD      . triamcinolone acetonide (KENALOG-40) injection 40 mg  40 mg Other Once Mohammed Kindle, MD      . triamcinolone acetonide (KENALOG-40) injection 40 mg  40 mg Other Once Mohammed Kindle, MD      . triamcinolone acetonide (KENALOG-40) injection 40 mg  40 mg Other Once Mohammed Kindle, MD      . triamcinolone acetonide (KENALOG-40) injection 40 mg  40 mg Other Once Mohammed Kindle, MD      . triamcinolone acetonide (KENALOG-40) injection 40 mg  40 mg Other Once Mohammed Kindle, MD       No results found for: WBC, HGB, HCT, PLT, GLUCOSE, CHOL, TRIG, HDL, LDLDIRECT, LDLCALC, ALT, AST, NA, K, CL, CREATININE, BUN, CO2, TSH, PSA,  INR, GLUF, HGBA1C, MICROALBUR  --------------------------------------------------------------------------------------------------------------------- Mm Digital Screening Bilateral  Result Date: 04/07/2016 CLINICAL DATA:  Screening.  EXAM: DIGITAL SCREENING BILATERAL MAMMOGRAM WITH CAD COMPARISON:  Previous exam(s). ACR Breast Density Category b: There are scattered areas of fibroglandular density. FINDINGS: There are no findings suspicious for malignancy. Images were processed with CAD. IMPRESSION: No mammographic evidence of malignancy. A result letter of this screening mammogram will be mailed directly to the patient. RECOMMENDATION: Screening mammogram in one year. (Code:SM-B-01Y) BI-RADS CATEGORY  1: Negative. Electronically Signed   By: Nolon Nations M.D.   On: 04/07/2016 09:57       ---------------------------------------------------------------------------------------------------------------------- Past Medical History:  Diagnosis Date  . Allergy   . Anxiety   . Chronic back pain   . Depression   . Generalized edema   . GERD (gastroesophageal reflux disease)   . IBS (irritable bowel syndrome)   . Insomnia   . Irritable bowel syndrome   . Positional vertigo   . Sinusitis   . Spastic colon     Past Surgical History:  Procedure Laterality Date  . ABDOMINAL HYSTERECTOMY    . APPENDECTOMY    . BREAST BIOPSY Right    stereo bx-neg  . DILATION AND CURETTAGE OF UTERUS    . FRACTURE SURGERY Left    arm    Family History  Problem Relation Age of Onset  . Cancer Mother   . COPD Mother   . Depression Mother   . Heart disease Mother   . Stroke Mother   . Drug abuse Brother   . Alcohol abuse Brother   . Mental illness Brother   . Heart disease Maternal Grandmother   . Breast cancer Neg Hx     Social History  Substance Use Topics  . Smoking status: Former Smoker    Years: 20.00    Quit date: 10/14/2013  . Smokeless tobacco: Never Used  . Alcohol use No    ---------------------------------------------------------------------------------------------------------------------  Scheduled Meds:  Continuous Infusions:  PRN Meds:.   BP 131/85   Pulse 92   Temp 98.5 F (36.9 C)   Resp 18    Ht 5\' 6"  (1.676 m)   Wt 176 lb (79.8 kg)   SpO2 97%   BMI 28.41 kg/m    BP Readings from Last 3 Encounters:  10/14/16 131/85  08/16/16 119/81  07/28/16 118/83     Wt Readings from Last 3 Encounters:  10/14/16 176 lb (79.8 kg)  08/16/16 178 lb (80.7 kg)  07/28/16 174 lb (78.9 kg)     ----------------------------------------------------------------------------------------------------------------------  ROS Review of Systems  Cardiac: No angina Psychiatric: No history of drug abuse or suicidal or homicidal ideation. GI no constipation  Objective:  BP 131/85   Pulse 92   Temp 98.5 F (36.9 C)   Resp 18   Ht 5\' 6"  (1.676 m)   Wt 176 lb (79.8 kg)   SpO2 97%   BMI 28.41 kg/m   Physical Exam Pupils are equally round reactive to light extraocular muscles are intact asked line heart is regular rate and rhythm without murmur Lungs are clear to auscultation Inspection of her low back reveals Continues to 2 show some mild paraspinous muscle tenderness and a pain that is reproduced with extension And motor strength is at baseline     Assessment & Plan:   Joylynn was seen today for back pain.  Diagnoses and all orders for this visit:  Facet syndrome, lumbar ([place])  Chronic bilateral low back pain without sciatica  Other orders -     HYDROcodone-acetaminophen (NORCO/VICODIN) 5-325 MG tablet; Take 1 tablet by mouth every 6 (six) hours as needed for moderate pain.     ----------------------------------------------------------------------------------------------------------------------  Problem List Items Addressed This Visit      Musculoskeletal and Integument   Facet syndrome, lumbar (H[facility]) - Primary   Relevant Medications   cyclobenzaprine (FLEXERIL) 10 MG tablet   HYDROcodone-acetaminophen (NORCO/VICODIN) 5-325 MG tablet    Other Visit Diagnoses    Chronic bilateral low back pain without sciatica       Relevant Medications   cyclobenzaprine (FLEXERIL) 10  MG tablet   HYDROcodone-acetaminophen (NORCO/VICODIN) 5-325 MG tablet      ----------------------------------------------------------------------------------------------------------------------  1. Degenerative disc disease, lumbar Continue with back stretching strengthening exercises as reviewed with her today and aerobic conditioning.   2. Facet syndrome, lumbar We have her scheduled for a return visit within the next few weeks for her second facet block bilateral. She is to continue with her stretching and core strengthening exercises as reviewed as well 3. Chronic bilateral low back pain without sciatica We will refill her medications for the next prescription on June 1 for 45 tabs of the hydrocodone     ----------------------------------------------------------------------------------------------------------------------  I have changed Ms. Reamer's HYDROcodone-acetaminophen. I am also having her maintain her Vitamin D3, zolpidem, esomeprazole, diclofenac, cyclobenzaprine, DULoxetine, amoxicillin, amoxicillin-clavulanate, diclofenac sodium, docusate sodium, doxycycline, Vitamin D (Ergocalciferol), CHERATUSSIN AC, HYDROcodone-homatropine, senna, sodium chloride, KENALOG, zolpidem, cyclobenzaprine, DULoxetine, esomeprazole, pantoprazole, bupivacaine (PF), cetirizine, pantoprazole, cyclobenzaprine, DULoxetine, Vitamin D (Ergocalciferol), zolpidem, fluticasone, ondansetron, ondansetron, polyethylene glycol-electrolytes, polyethylene glycol, and sucralfate. We will continue to administer bupivacaine (PF), fentaNYL, lactated ringers, midazolam, orphenadrine, triamcinolone acetonide, bupivacaine (PF), fentaNYL, lactated ringers, midazolam, orphenadrine, triamcinolone acetonide, bupivacaine (PF), orphenadrine, triamcinolone acetonide, lactated ringers, sodium chloride flush, triamcinolone acetonide, lactated ringers, bupivacaine (PF), orphenadrine, triamcinolone acetonide, and sodium chloride  flush.   Meds ordered this encounter  Medications  . pantoprazole (PROTONIX) 40 MG tablet    Sig: Take 40 mg by mouth daily.   . bupivacaine, PF, (MARCAINE) 0.25 % SOLN injection    Sig: 30 mL.  . cetirizine (ZYRTEC) 10 MG tablet    Sig: Take 10 mg by mouth.  . DISCONTD: HYDROcodone-acetaminophen (NORCO/VICODIN) 5-325 MG tablet    Sig: Take by mouth.  . pantoprazole (PROTONIX) 40 MG tablet    Sig: Take 40 mg by mouth.  . cyclobenzaprine (FLEXERIL) 10 MG tablet    Sig: Take 20 mg by mouth.  . DULoxetine (CYMBALTA) 20 MG capsule    Sig: Take 40 mg by mouth.  . Vitamin D, Ergocalciferol, (DRISDOL) 50000 units CAPS capsule    Sig: Take by mouth.  . zolpidem (AMBIEN CR) 12.5 MG CR tablet    Sig: Take 12.5 mg by mouth.  . fluticasone (FLONASE) 50 MCG/ACT nasal spray  . ondansetron (ZOFRAN-ODT) 8 MG disintegrating tablet  . ondansetron (ZOFRAN) 4 MG tablet  . polyethylene glycol-electrolytes (NULYTELY/GOLYTELY) 420 g solution  . polyethylene glycol (MIRALAX / GLYCOLAX) packet  . sucralfate (CARAFATE) 1 g tablet  . HYDROcodone-acetaminophen (NORCO/VICODIN) 5-325 MG tablet    Sig: Take 1 tablet by mouth every 6 (six) hours as needed for moderate pain.    Dispense:  45 tablet    Refill:  0    Do not fill until [id number]  Dr. Vashti Hey M.D.   Follow-up: Return in about 12 days (around 10/26/2016) for evaluation, procedure.    Molli Barrows, MD 6:44 PM  The [facility] practitioner database for opioid medications on  this patient has been reviewed by me and my staff   Greater than 50% of the total encounter time was spent in counseling and / or coordination of care.     This dictation was performed utilizing Systems analyst.  Please excuse any unintentional or mistaken typographical errors as a result.

## 2016-10-14 NOTE — Addendum Note (Signed)
Addended by: Molli Barrows on: 10/14/2016 02:43 PM   Modules accepted: Level of Service

## 2016-10-14 NOTE — Patient Instructions (Signed)
You were given a script for Hydrocodone x 1.  You were given pre procedure instructions .    Preparing for Procedure with Sedation Instructions: . Oral Intake: Do not eat or drink anything for at least 8 hours prior to your procedure. . Transportation: Public transportation is not allowed. Bring an adult driver. The driver must be physically present in our waiting room before any procedure can be started. Marland Kitchen Physical Assistance: Bring an adult capable of physically assisting you, in the event you need help. . Blood Pressure Medicine: Take your blood pressure medicine with a sip of water the morning of the procedure. . Insulin: Take only  of your normal insulin dose. . Preventing infections: Shower with an antibacterial soap the morning of your procedure. . Build-up your immune system: Take 1000 mg of Vitamin C with every meal (3 times a day) the day prior to your procedure. . Pregnancy: If you are pregnant, call and cancel the procedure. . Sickness: If you have a cold, fever, or any active infections, call and cancel the procedure. . Arrival: You must be in the facility at least 30 minutes prior to your scheduled procedure. . Children: Do not bring children with you. . Dress appropriately: Bring dark clothing that you would not mind if they get stained. . Valuables: Do not bring any jewelry or valuables. Procedure appointments are reserved for interventional treatments only. Marland Kitchen No Prescription Refills. . No medication changes will be discussed during procedure appointments. No disability issues will be discussed.Facet Blocks Patient Information  Description: The facets are joints in the spine between the vertebrae.  Like any joints in the body, facets can become irritated and painful.  Arthritis can also effect the facets.  By injecting steroids and local anesthetic in and around these joints, we can temporarily block the nerve supply to them.  Steroids act directly on irritated nerves and  tissues to reduce selling and inflammation which often leads to decreased pain.  Facet blocks may be done anywhere along the spine from the neck to the low back depending upon the location of your pain.   After numbing the skin with local anesthetic (like Novocaine), a small needle is passed onto the facet joints under x-ray guidance.  You may experience a sensation of pressure while this is being done.  The entire block usually lasts about 15-25 minutes.   Conditions which may be treated by facet blocks:   Low back/buttock pain  Neck/shoulder pain  Certain types of headaches  Preparation for the injection:  1. Do not eat any solid food or dairy products within 8 hours of your appointment. 2. You may drink clear liquid up to 3 hours before appointment.  Clear liquids include water, black coffee, juice or soda.  No milk or cream please. 3. You may take your regular medication, including pain medications, with a sip of water before your appointment.  Diabetics should hold regular insulin (if taken separately) and take 1/2 normal NPH dose the morning of the procedure.  Carry some sugar containing items with you to your appointment. 4. A driver must accompany you and be prepared to drive you home after your procedure. 5. Bring all your current medications with you. 6. An IV may be inserted and sedation may be given at the discretion of the physician. 7. A blood pressure cuff, EKG and other monitors will often be applied during the procedure.  Some patients may need to have extra oxygen administered for a short period. 8. You  will be asked to provide medical information, including your allergies and medications, prior to the procedure.  We must know immediately if you are taking blood thinners (like Coumadin/Warfarin) or if you are allergic to IV iodine contrast (dye).  We must know if you could possible be pregnant.  Possible side-effects:   Bleeding from needle site  Infection (rare, may  require surgery)  Nerve injury (rare)  Numbness & tingling (temporary)  Difficulty urinating (rare, temporary)  Spinal headache (a headache worse with upright posture)  Light-headedness (temporary)  Pain at injection site (serveral days)  Decreased blood pressure (rare, temporary)  Weakness in arm/leg (temporary)  Pressure sensation in back/neck (temporary)   Call if you experience:   Fever/chills associated with headache or increased back/neck pain  Headache worsened by an upright position  New onset, weakness or numbness of an extremity below the injection site  Hives or difficulty breathing (go to the emergency room)  Inflammation or drainage at the injection site(s)  Severe back/neck pain greater than usual  New symptoms which are concerning to you  Please note:  Although the local anesthetic injected can often make your back or neck feel good for several hours after the injection, the pain will likely return. It takes 3-7 days for steroids to work.  You may not notice any pain relief for at least one week.  If effective, we will often do a series of 2-3 injections spaced 3-6 weeks apart to maximally decrease your pain.  After the initial series, you may be a candidate for a more permanent nerve block of the facets.  If you have any questions, please call #[phone]) [facility]

## 2016-10-26 ENCOUNTER — Ambulatory Visit (HOSPITAL_BASED_OUTPATIENT_CLINIC_OR_DEPARTMENT_OTHER): Payer: BC Managed Care – PPO | Admitting: Anesthesiology

## 2016-10-26 ENCOUNTER — Encounter: Payer: Self-pay | Admitting: Anesthesiology

## 2016-10-26 ENCOUNTER — Ambulatory Visit
Admission: RE | Admit: 2016-10-26 | Discharge: 2016-10-26 | Disposition: A | Discharge status: Home / Self Care | Payer: BC Managed Care – PPO | Source: Ambulatory Visit | Attending: Anesthesiology | Admitting: Anesthesiology

## 2016-10-26 ENCOUNTER — Other Ambulatory Visit: Payer: Self-pay | Admitting: Anesthesiology

## 2016-10-26 VITALS — BP 118/65 | HR 79 | Temp 98.1°F | Resp 16 | Ht 66.0 in | Wt 174.0 lb

## 2016-10-26 DIAGNOSIS — K589 Irritable bowel syndrome without diarrhea: Secondary | ICD-10-CM | POA: Insufficient documentation

## 2016-10-26 DIAGNOSIS — R52 Pain, unspecified: Secondary | ICD-10-CM

## 2016-10-26 DIAGNOSIS — Z87891 Personal history of nicotine dependence: Secondary | ICD-10-CM | POA: Diagnosis not present

## 2016-10-26 DIAGNOSIS — G8929 Other chronic pain: Secondary | ICD-10-CM

## 2016-10-26 DIAGNOSIS — F329 Major depressive disorder, single episode, unspecified: Secondary | ICD-10-CM | POA: Insufficient documentation

## 2016-10-26 DIAGNOSIS — M545 Low back pain, unspecified: Secondary | ICD-10-CM

## 2016-10-26 DIAGNOSIS — R42 Dizziness and giddiness: Secondary | ICD-10-CM | POA: Insufficient documentation

## 2016-10-26 DIAGNOSIS — F419 Anxiety disorder, unspecified: Secondary | ICD-10-CM | POA: Insufficient documentation

## 2016-10-26 DIAGNOSIS — M47816 Spondylosis without myelopathy or radiculopathy, lumbar region: Secondary | ICD-10-CM

## 2016-10-26 DIAGNOSIS — K219 Gastro-esophageal reflux disease without esophagitis: Secondary | ICD-10-CM | POA: Diagnosis not present

## 2016-10-26 DIAGNOSIS — M5136 Other intervertebral disc degeneration, lumbar region: Secondary | ICD-10-CM | POA: Diagnosis not present

## 2016-10-26 DIAGNOSIS — M4696 Unspecified inflammatory spondylopathy, lumbar region: Secondary | ICD-10-CM | POA: Diagnosis not present

## 2016-10-26 DIAGNOSIS — J329 Chronic sinusitis, unspecified: Secondary | ICD-10-CM | POA: Diagnosis not present

## 2016-10-26 DIAGNOSIS — R601 Generalized edema: Secondary | ICD-10-CM | POA: Insufficient documentation

## 2016-10-26 MED ORDER — FENTANYL CITRATE (PF) 100 MCG/2ML IJ SOLN
INTRAMUSCULAR | Status: AC
  Filled 2016-10-26: qty 2

## 2016-10-26 MED ORDER — ROPIVACAINE HCL 2 MG/ML IJ SOLN
INTRAMUSCULAR | Status: AC
  Filled 2016-10-26: qty 10

## 2016-10-26 MED ORDER — SODIUM CHLORIDE 0.9% FLUSH: 10.0000 mL | Freq: Once | INTRAVENOUS | Status: DC

## 2016-10-26 MED ORDER — LACTATED RINGERS IV SOLN
1000.0000 mL | INTRAVENOUS | Status: DC
  Administered 2016-10-26: 1000 mL via INTRAVENOUS

## 2016-10-26 MED ORDER — TRIAMCINOLONE ACETONIDE 40 MG/ML IJ SUSP
INTRAMUSCULAR | Status: AC
  Filled 2016-10-26: qty 2

## 2016-10-26 MED ORDER — FENTANYL CITRATE (PF) 100 MCG/2ML IJ SOLN: 100.0000 ug | Freq: Once | INTRAMUSCULAR | Status: DC

## 2016-10-26 MED ORDER — TRIAMCINOLONE ACETONIDE 40 MG/ML IJ SUSP
INTRAMUSCULAR | Status: AC
  Filled 2016-10-26: qty 1

## 2016-10-26 MED ORDER — MIDAZOLAM HCL 5 MG/5ML IJ SOLN
5.0000 mg | Freq: Once | INTRAMUSCULAR | Status: AC
  Administered 2016-10-26: 5 mg via INTRAVENOUS

## 2016-10-26 MED ORDER — TRIAMCINOLONE ACETONIDE 40 MG/ML IJ SUSP
80.0000 mg | Freq: Once | INTRAMUSCULAR | Status: AC
  Administered 2016-10-26: 40 mg

## 2016-10-26 MED ORDER — LIDOCAINE HCL (PF) 1 % IJ SOLN
5.0000 mL | Freq: Once | INTRAMUSCULAR | Status: AC
  Administered 2016-10-26: 5 mL via SUBCUTANEOUS

## 2016-10-26 MED ORDER — ROPIVACAINE HCL 2 MG/ML IJ SOLN
INTRAMUSCULAR | Status: AC
  Filled 2016-10-26: qty 20

## 2016-10-26 MED ORDER — MIDAZOLAM HCL 5 MG/5ML IJ SOLN
INTRAMUSCULAR | Status: AC
  Filled 2016-10-26: qty 5

## 2016-10-26 MED ORDER — ROPIVACAINE HCL 2 MG/ML IJ SOLN
20.0000 mL | Freq: Once | INTRAMUSCULAR | Status: AC
  Administered 2016-10-26: 10 mL via EPIDURAL

## 2016-10-26 MED ORDER — LIDOCAINE HCL (PF) 1 % IJ SOLN
INTRAMUSCULAR | Status: AC
  Filled 2016-10-26: qty 10

## 2016-10-26 NOTE — Progress Notes (Signed)
Subjective:  Patient ID: Katherine Lester, female    DOB: [birth year]  Age: 61 y.o. MRN: [id number]  CC: Back Pain (low)   Service Provided on Last Visit: Med Refill PROCEDURE:Bilateral L3-4 L4-5 L5-S1 and S1 medial branch block to the facet nerves under fluoroscopic guidance with moderate sedation #2 Previous procedure 08/16/2016 Bilateral L3-4 L4-5 L5-S1 and S1 medial branch block to the facet nerves of the lumbar spine under fluoroscopic guidance with moderate sedation  HPI Katherine Lester presents for reevaluation. She was last seen a few weeks ago and presents today with the same quality characteristic and distribution of low back pain. She has had one previous bilateral lumbar facet block in the past and got to months of relief from this. In the past she's had these periodically generally averaging 2-3 months of good relief. The stylet pain is as before and she presents today requesting a repeat bilateral facet block for this pain. She generally responds well with this and it enables her to be more active sleep better at night and have less pain as well as less reliance on medication management. History Katherine Lester has a past medical history of Allergy; Anxiety; Chronic back pain; Depression; Generalized edema; GERD (gastroesophageal reflux disease); IBS (irritable bowel syndrome); Insomnia; Irritable bowel syndrome; Positional vertigo; Sinusitis; and Spastic colon.   She has a past surgical history that includes Abdominal hysterectomy; Appendectomy; Fracture surgery (Left); Dilation and curettage of uterus; and Breast biopsy (Right).   Her family history includes Alcohol abuse in her brother; COPD in her mother; Cancer in her mother; Depression in her mother; Drug abuse in her brother; Heart disease in her maternal grandmother and mother; Mental illness in her brother; Stroke in her mother.She reports that she quit smoking about 3 years ago. She quit after 20.00 years of use. She has never  used smokeless tobacco. She reports that she does not drink alcohol or use drugs.  Results for orders placed in visit on 01/07/14  [facility] W/O Cm   Narrative * PRIOR REPORT IMPORTED FROM AN EXTERNAL SYSTEM *   CLINICAL DATA:  Low back, right leg and right hip pain.   EXAM:  MRI LUMBAR SPINE WITHOUT CONTRAST   TECHNIQUE:  Multiplanar, multisequence MR imaging of the lumbar spine was  performed. No intravenous contrast was administered.   COMPARISON:  None.   FINDINGS:  Vertebral body height, signal and alignment are maintained.  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 The conus medullaris is normal in signal  and position. Imaged intra-abdominal contents are unremarkable.   The T11-12 and T12-L1 levels are imaged in the sagittal plane only  and negative.   L1-2:  Negative.   L2-3:  Negative.   L3-4:  Negative.   L4-5: Minimal disc bulge without central canal or foraminal  narrowing.   L5-S1: Minimal disc bulge without central canal or foraminal  narrowing. 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 No nerve root compression is identified.    Electronically Signed    By: Inge Rise M.D.    On: 01/07/2014 10:46        [facility] Select 13  Date Value Ref Range Status  07/28/2016 FINAL  Final    Comment:    ==================================================================== [facility] SELECT 19 (MW) ====================================================================  Test                             Result       Flag       Units Drug Present and Declared for Prescription Verification   Hydrocodone                    173          EXPECTED   ng/mg creat   Norhydrocodone                 314          EXPECTED   ng/mg creat    Sources of hydrocodone  include scheduled prescription    medications. Norhydrocodone is an expected metabolite of    hydrocodone. Drug Absent but Declared for Prescription Verification   Codeine                        Not Detected UNEXPECTED ng/mg creat ==================================================================== Test                      Result    Flag   Units      Ref Range   Creatinine              146              mg/dL      >=20 ==================================================================== Declared Medications:  The flagging and interpretation on this report are based on the  following declared medications.  Unexpected results may arise from  inaccuracies in the declared medications.  **Note: The testing scope of this panel includes these medications:  Codeine (Cheratussin AC)  Hydrocodone  Hydrocodone (Hydrocodone-Acetaminophen)  **Note: The testing scope of this panel does not include following  reported medications:  Acetaminophen (Hydrocodone-Acetaminophen)  Amoxicillin  Amoxicillin (Augmentin)  Clavulinate (Augmentin)  Cyclobenzaprine  Diclofenac (Flector)  Diclofenac (Voltaren)  Docusate (Colace)  Docusate (Senokot-S)  Doxycycline  Duloxetine  Guaifenesin (Cheratussin AC)  Omeprazole (Nexium)  Sennosides (Senokot-S)  Sodium Chloride  Triamcinolone (Kenalog)  Vitamin D2 (Ergocalciferol)  Vitamin D3  Zolpidem (Ambien) ==================================================================== For clinical consultation, please call [phone]. ====================================================================     Outpatient Medications Prior to Visit  Medication Sig Dispense Refill  . cetirizine (ZYRTEC) 10 MG tablet Take 10 mg by mouth.    . Cholecalciferol (VITAMIN D3) 10000 UNITS capsule Take 50,000 Units by mouth once a week.     . cyclobenzaprine (FLEXERIL) 10 MG tablet Limit 1 tab by mouth 2-4 times a day if tolerated 100 tablet 0  . DULoxetine (CYMBALTA) 20 MG  capsule Limit 1 capsule by mouth per day if tolerated 30 capsule 0  . fluticasone (FLONASE) 50 MCG/ACT nasal spray     . HYDROcodone-acetaminophen (NORCO/VICODIN) 5-325 MG tablet Take 1 tablet by mouth every 6 (six) hours as needed for moderate pain. 45 tablet 0  . pantoprazole (PROTONIX) 40 MG tablet Take 40 mg by mouth daily.     . polyethylene glycol (MIRALAX / GLYCOLAX) packet     . sucralfate (CARAFATE) 1 g tablet     . Vitamin D, Ergocalciferol, (DRISDOL) 50000 units CAPS capsule Take by mouth.    . zolpidem (AMBIEN) 5 MG tablet Take 5 mg by mouth at bedtime.    Marland Kitchen amoxicillin (AMOXIL) 500 MG capsule     . amoxicillin-clavulanate (AUGMENTIN) 875-125 MG tablet     .  bupivacaine, PF, (MARCAINE) 0.25 % SOLN injection 30 mL.    Marland Kitchen CHERATUSSIN AC 100-10 MG/5ML syrup     . cyclobenzaprine (FLEXERIL) 10 MG tablet Take by mouth.    . cyclobenzaprine (FLEXERIL) 10 MG tablet Take 20 mg by mouth.    . diclofenac (FLECTOR) 1.3 % PTCH Apply patch or a portion of patch to painful area of skin twice per day if tolerated (Patient not taking: Reported on 07/28/2016) 60 patch 0  . diclofenac sodium (VOLTAREN) 1 % GEL Apply topically.    . docusate sodium (COLACE) 100 MG capsule Take 100 mg by mouth at bedtime.     Marland Kitchen doxycycline (VIBRA-TABS) 100 MG tablet     . DULoxetine (CYMBALTA) 20 MG capsule Take by mouth.    . DULoxetine (CYMBALTA) 20 MG capsule Take 40 mg by mouth.    . esomeprazole (NEXIUM) 40 MG capsule Take 40 mg by mouth daily at 12 noon.    Marland Kitchen esomeprazole (NEXIUM) 40 MG capsule Take by mouth.    Marland Kitchen HYDROcodone-homatropine (HYCODAN) 5-1.5 MG/5ML syrup     . KENALOG 40 MG/ML injection     . ondansetron (ZOFRAN) 4 MG tablet     . ondansetron (ZOFRAN-ODT) 8 MG disintegrating tablet     . pantoprazole (PROTONIX) 40 MG tablet Take 40 mg by mouth.    . polyethylene glycol-electrolytes (NULYTELY/GOLYTELY) 420 g solution     . senna (SENOKOT) 8.6 MG tablet Take by mouth.    . sodium chloride 0.9 %  injection     . Vitamin D, Ergocalciferol, (DRISDOL) 50000 units CAPS capsule Take by mouth.    . zolpidem (AMBIEN CR) 12.5 MG CR tablet Take 12.5 mg by mouth.    . zolpidem (AMBIEN) 10 MG tablet Take by mouth at bedtime.      Facility-Administered Medications Prior to Visit  Medication Dose Route Frequency Provider Last Rate Last Dose  . bupivacaine (PF) (MARCAINE) 0.25 % injection 30 mL  30 mL Other Once Mohammed Kindle, MD      . bupivacaine (PF) (MARCAINE) 0.25 % injection 30 mL  30 mL Other Once Mohammed Kindle, MD      . bupivacaine (PF) (MARCAINE) 0.25 % injection 30 mL  30 mL Other Once Mohammed Kindle, MD      . bupivacaine (PF) (MARCAINE) 0.25 % injection 30 mL  30 mL Other Once Mohammed Kindle, MD      . fentaNYL (SUBLIMAZE) injection 100 mcg  100 mcg Intravenous Once Mohammed Kindle, MD      . fentaNYL (SUBLIMAZE) injection 100 mcg  100 mcg Intravenous Once Mohammed Kindle, MD      . lactated ringers infusion 1,000 mL  1,000 mL Intravenous Continuous Mohammed Kindle, MD      . lactated ringers infusion 1,000 mL  1,000 mL Intravenous Continuous Mohammed Kindle, MD      . lactated ringers infusion 1,000 mL  1,000 mL Intravenous Continuous Mohammed Kindle, MD 125 mL/hr at 10/15/15 0923 1,000 mL at 10/15/15 0923  . lactated ringers infusion 1,000 mL  1,000 mL Intravenous Continuous Mohammed Kindle, MD 125 mL/hr at 01/12/16 0950 1,000 mL at 01/12/16 0950  . midazolam (VERSED) 5 MG/5ML injection 5 mg  5 mg Intravenous Once Mohammed Kindle, MD      . midazolam (VERSED) 5 MG/5ML injection 5 mg  5 mg Intravenous Once Mohammed Kindle, MD      . orphenadrine (NORFLEX) injection 60 mg  60 mg Intramuscular Once Mohammed Kindle, MD      .  orphenadrine (NORFLEX) injection 60 mg  60 mg Intramuscular Once Mohammed Kindle, MD      . orphenadrine (NORFLEX) injection 60 mg  60 mg Intramuscular Once Mohammed Kindle, MD      . orphenadrine (NORFLEX) injection 60 mg  60 mg Intramuscular Once Mohammed Kindle, MD      .  sodium chloride flush (NS) 0.9 % injection 20 mL  20 mL Other Once Mohammed Kindle, MD      . sodium chloride flush (NS) 0.9 % injection 20 mL  20 mL Other Once Mohammed Kindle, MD      . triamcinolone acetonide (KENALOG-40) injection 40 mg  40 mg Other Once Mohammed Kindle, MD      . triamcinolone acetonide (KENALOG-40) injection 40 mg  40 mg Other Once Mohammed Kindle, MD      . triamcinolone acetonide (KENALOG-40) injection 40 mg  40 mg Other Once Mohammed Kindle, MD      . triamcinolone acetonide (KENALOG-40) injection 40 mg  40 mg Other Once Mohammed Kindle, MD      . triamcinolone acetonide (KENALOG-40) injection 40 mg  40 mg Other Once Mohammed Kindle, MD       No results found for: WBC, HGB, HCT, PLT, GLUCOSE, CHOL, TRIG, HDL, LDLDIRECT, LDLCALC, ALT, AST, NA, K, CL, CREATININE, BUN, CO2, TSH, PSA, INR, GLUF, HGBA1C, MICROALBUR  --------------------------------------------------------------------------------------------------------------------- Mm Digital Screening Bilateral  Result Date: 04/07/2016 CLINICAL DATA:  Screening. EXAM: DIGITAL SCREENING BILATERAL MAMMOGRAM WITH CAD COMPARISON:  Previous exam(s). ACR Breast Density Category b: There are scattered areas of fibroglandular density. FINDINGS: There are no findings suspicious for malignancy. Images were processed with CAD. IMPRESSION: No mammographic evidence of malignancy. A result letter of this screening mammogram will be mailed directly to the patient. RECOMMENDATION: Screening mammogram in one year. (Code:SM-B-01Y) BI-RADS CATEGORY  1: Negative. Electronically Signed   By: Nolon Nations M.D.   On: 04/07/2016 09:57       ---------------------------------------------------------------------------------------------------------------------- Past Medical History:  Diagnosis Date  . Allergy   . Anxiety   . Chronic back pain   . Depression   . Generalized edema   . GERD (gastroesophageal reflux disease)   . IBS (irritable bowel  syndrome)   . Insomnia   . Irritable bowel syndrome   . Positional vertigo   . Sinusitis   . Spastic colon     Past Surgical History:  Procedure Laterality Date  . ABDOMINAL HYSTERECTOMY    . APPENDECTOMY    . BREAST BIOPSY Right    stereo bx-neg  . DILATION AND CURETTAGE OF UTERUS    . FRACTURE SURGERY Left    arm    Family History  Problem Relation Age of Onset  . Cancer Mother   . COPD Mother   . Depression Mother   . Heart disease Mother   . Stroke Mother   . Drug abuse Brother   . Alcohol abuse Brother   . Mental illness Brother   . Heart disease Maternal Grandmother   . Breast cancer Neg Hx     Social History  Substance Use Topics  . Smoking status: Former Smoker    Years: 20.00    Quit date: 10/14/2013  . Smokeless tobacco: Never Used  . Alcohol use No    ---------------------------------------------------------------------------------------------------------------------  Scheduled Meds: . sodium chloride flush  10 mL Other Once   Continuous Infusions: . lactated ringers 1,000 mL (10/26/16 1630)      BP 117/70   Pulse 83   Temp 98  F (36.7 C)   Resp 15   Ht 5\' 6"  (1.676 m)   Wt 174 lb (78.9 kg)   SpO2 99%   BMI 28.08 kg/m    BP Readings from Last 3 Encounters:  10/26/16 117/70  10/14/16 131/85  08/16/16 119/81     Wt Readings from Last 3 Encounters:  10/26/16 174 lb (78.9 kg)  10/14/16 176 lb (79.8 kg)  08/16/16 178 lb (80.7 kg)     ----------------------------------------------------------------------------------------------------------------------  ROS Review of Systems  Cardiac: No angina Psychiatric: No history of drug abuse or suicidal or homicidal ideation. GI no constipationOtherwise no changes noted  Objective:  BP 117/70   Pulse 83   Temp 98 F (36.7 C)   Resp 15   Ht 5\' 6"  (1.676 m)   Wt 174 lb (78.9 kg)   SpO2 99%   BMI 28.08 kg/m   Physical Exam Pupils are equally round reactive to light extraocular  muscles are intact asked line heart is regular rate and rhythm without murmur Lungs are clear to auscultation She continues to have pain with bilateral rotation and extension at the low back as before. Overall level of soreness in the lumbar paraspinous muscles is diminished. Muscle tone and bulk is as per baseline    Assessment & Plan:   Katherine Lester was seen today for back pain.  Diagnoses and all orders for this visit:  Facet syndrome, lumbar (HCC) -     LUMBAR FACET(MEDIAL BRANCH NERVE BLOCK) MBNB -     triamcinolone acetonide (KENALOG-40) injection 80 mg; 2 mLs (80 mg total) by Other route once. -     sodium chloride flush (NS) 0.9 % injection 10 mL; 10 mLs by Other route once. -     ropivacaine (PF) 2 mg/mL (0.2%) (NAROPIN) injection 20 mL; 20 mLs by Epidural route once. -     midazolam (VERSED) injection 5 mg; Inject 5 mLs (5 mg total) into the vein once. -     lidocaine (PF) (XYLOCAINE) 1 % injection 5 mL; Inject 5 mLs into the skin once. -     lactated ringers infusion 1,000 mL; Inject 1,000 mLs into the vein continuous.  Chronic bilateral low back pain without sciatica     ----------------------------------------------------------------------------------------------------------------------  Problem List Items Addressed This Visit      Musculoskeletal and Integument   Facet syndrome, lumbar (HCC) - Primary   Relevant Medications   triamcinolone acetonide (KENALOG-40) injection 80 mg (Completed)   sodium chloride flush (NS) 0.9 % injection 10 mL   ropivacaine (PF) 2 mg/mL (0.2%) (NAROPIN) injection 20 mL (Completed)   midazolam (VERSED) injection 5 mg (Completed)   lidocaine (PF) (XYLOCAINE) 1 % injection 5 mL (Completed)   lactated ringers infusion 1,000 mL    Other Visit Diagnoses    Chronic bilateral low back pain without sciatica       Relevant Medications   triamcinolone acetonide (KENALOG-40) injection 80 mg (Completed)       ----------------------------------------------------------------------------------------------------------------------  1. Degenerative disc disease, lumbar Continue with back stretching strengthening exercises as reviewed with her today and aerobic conditioning.   2. Facet syndrome, lumbar We will proceed with her second lumbar facet block bilaterally today. The risks and benefits been once again reviewed all questions answered no guarantees were made. She is to return to clinic in 2 months for reevaluation. She is to continue her current baseline medication management with sparing use of hydrocodone and continued physical therapy exercises modalities 3. Chronic bilateral low back pain without  sciatica We will refill her medications for the next prescription on June 1 for 45 tabs of the hydrocodone we reviewed her [facility] practitioner database and it is appropriate.     ----------------------------------------------------------------------------------------------------------------------  I have discontinued Ms. Cicalese's esomeprazole, diclofenac, amoxicillin, amoxicillin-clavulanate, diclofenac sodium, docusate sodium, doxycycline, CHERATUSSIN AC, HYDROcodone-homatropine, senna, sodium chloride, KENALOG, esomeprazole, bupivacaine (PF), ondansetron, ondansetron, polyethylene glycol-electrolytes, and polyethylene glycol powder. I am also having her maintain her Vitamin D3, zolpidem, cyclobenzaprine, DULoxetine, Vitamin D (Ergocalciferol), pantoprazole, cetirizine, fluticasone, polyethylene glycol, sucralfate, and HYDROcodone-acetaminophen. We administered triamcinolone acetonide, ropivacaine (PF) 2 mg/mL (0.2%), midazolam, lidocaine (PF), and lactated ringers. We will continue to administer bupivacaine (PF), fentaNYL, lactated ringers, midazolam, orphenadrine, triamcinolone acetonide, bupivacaine (PF), fentaNYL, lactated ringers, midazolam, orphenadrine, triamcinolone acetonide, bupivacaine  (PF), orphenadrine, triamcinolone acetonide, lactated ringers, sodium chloride flush, triamcinolone acetonide, lactated ringers, bupivacaine (PF), orphenadrine, triamcinolone acetonide, and sodium chloride flush.   Meds ordered this encounter  Medications  . DISCONTD: polyethylene glycol powder (GLYCOLAX/MIRALAX) powder  . triamcinolone acetonide (KENALOG-40) injection 80 mg  . sodium chloride flush (NS) 0.9 % injection 10 mL  . ropivacaine (PF) 2 mg/mL (0.2%) (NAROPIN) injection 20 mL  . midazolam (VERSED) injection 5 mg  . lidocaine (PF) (XYLOCAINE) 1 % injection 5 mL  . lactated ringers infusion 1,000 mL  Dr. Vashti Hey M.D.   Follow-up: Return in about 2 months (around 12/26/2016) for evaluation, med refill.   Procedure: Bilateral lumbar medial branch block today lumbar facets at L2-3 L3-4 L4-5 L5-S1 under fluoroscopic guidance with moderate sedation   Procedure: Bilateral lumbar medial branch block under fluoroscopic guidance at L3-4 L4-5 L5-S1 with sedation Patient was taken to the fluoroscopy suite and placed in the prone position. A total dose of 5 mg of Versed with one half cc of fentanyl was titrated for moderate sedation. Vital signs were stable throughout the procedure. The  overlying area of skin on the  right side was prepped with Betadine 3 and strict aseptic technique was utilized throughout the procedure. Flouroscopy was used to  identify the areas overlying the aforementioned facets at  L2-3 L3-4  L4-5 and  L5-S1. 1% lidocaine 1 cc was infiltrated subcutaneously and into the fascia with a 25-gauge needle at each of these sites. I then advanced a 22-gauge 3-1/2 inch Quinckie needle with the needle tip to lie at the "[facility] portion of the [employer] dog". There was negative aspiration for heme or CSF and  no paresthesia. I then injected 2 cc of ropivacaine 0.2% mixed with 10 mg of triamcinolone at each of the aforementioned sites. These needles were withdrawn.  On the  opposite side, I then injected 1% lidocaine as above and advanced 22-gauge Quinky needles as before to the [place] eye portion of the [employer] dog". These needles were placed without paresthesia and with strict aseptic technique and negative aspiration. As before I injected 2 cc of ropivacaine .2% mixed with 10 mg triamcinolone at each site without evidence of IV or subarachnoid symptoms. These needles were withdrawn. The patient was convalesced discharged home in stable condition  Dr. Vashti Hey M.D. Molli Barrows, MD 5:01 PM  The [place] practitioner database for opioid medications on this patient has been reviewed by me and my staff   Greater than 50% of the total encounter time was spent in counseling and / or coordination of care.     This dictation was performed utilizing Systems analyst.  Please excuse any unintentional or mistaken typographical errors  as a result.

## 2016-10-26 NOTE — Patient Instructions (Signed)
Pain Management Discharge Instructions  General Discharge Instructions :  If you need to reach your doctor call: Monday-Friday 8:00 am - 4:00 pm at [phone] or toll free [phone].  After clinic hours [phone] to have operator reach doctor.  Bring all of your medication bottles to all your appointments in the pain clinic.  To cancel or reschedule your appointment with Pain Management please remember to call 24 hours in advance to avoid a fee.  Refer to the educational materials which you have been given on: General Risks, I had my Procedure. Discharge Instructions, Post Sedation.  Post Procedure Instructions:  The drugs you were given will stay in your system until tomorrow, so for the next 24 hours you should not drive, make any legal decisions or drink any alcoholic beverages.  You may eat anything you prefer, but it is better to start with liquids then soups and crackers, and gradually work up to solid foods.  Please notify your doctor immediately if you have any unusual bleeding, trouble breathing or pain that is not related to your normal pain.  Depending on the type of procedure that was done, some parts of your body may feel week and/or numb.  This usually clears up by tonight or the next day.  Walk with the use of an assistive device or accompanied by an adult for the 24 hours.  You may use ice on the affected area for the first 24 hours.  Put ice in a Ziploc bag and cover with a towel and place against area 15 minutes on 15 minutes off.  You may switch to heat after 24 hours.Facet Joint Block The facet joints connect the bones of the spine (vertebrae). They make it possible for you to bend, twist, and make other movements with your spine. They also keep you from bending too far, twisting too far, and making other excessive movements. A facet joint block is a procedure where a numbing medicine (anesthetic) is injected into a facet joint. Often, a type of  anti-inflammatory medicine called a steroid is also injected. A facet joint block may be done to diagnose neck or back pain. If the pain gets better after a facet joint block, it means the pain is probably coming from the facet joint. If the pain does not get better, it means the pain is probably not coming from the facet joint. A facet joint block may also be done to relieve neck or back pain caused by an inflamed facet joint. A facet joint block is only done to relieve pain if the pain does not improve with other methods, such as medicine, exercise programs, and physical therapy. Tell a health care provider about:  Any allergies you have.  All medicines you are taking, including vitamins, herbs, eye drops, creams, and over-the-counter medicines.  Any problems you or family members have had with anesthetic medicines.  Any blood disorders you have.  Any surgeries you have had.  Any medical conditions you have.  Whether you are pregnant or may be pregnant. What are the risks? Generally, this is a safe procedure. However, problems may occur, including:  Bleeding.  Injury to a nerve near the injection site.  Pain at the injection site.  Weakness or numbness in areas controlled by nerves near the injection site.  Infection.  Temporary fluid retention.  Allergic reactions to medicines or dyes.  Injury to other structures or organs near the injection site. What happens before the procedure?  Follow instructions from your health care  provider about eating or drinking restrictions.  Ask your health care provider about:  Changing or stopping your regular medicines. This is especially important if you are taking diabetes medicines or blood thinners.  Taking medicines such as aspirin and ibuprofen. These medicines can thin your blood. Do not take these medicines before your procedure if your health care provider instructs you not to.  Do not take any new dietary supplements or  medicines without asking your health care provider first.  Plan to have someone take you home after the procedure. What happens during the procedure?  You may need to remove your clothing and dress in an open-back gown.  The procedure will be done while you are lying on an X-ray table. You will most likely be asked to lie on your stomach, but you may be asked to lie in a different position if an injection will be made in your neck.  Machines will be used to monitor your oxygen levels, heart rate, and blood pressure.  If an injection will be made in your neck, an IV tube will be inserted into one of your veins. Fluids and medicine will flow directly into your body through the IV tube.  The area over the facet joint where the injection will be made will be cleaned with soap. The surrounding skin will be covered with clean drapes.  A numbing medicine (local anesthetic) will be applied to your skin. Your skin may sting or burn for a moment.  A video X-ray machine (fluoroscopy) will be used to locate the joint. In some cases, a CT scan may be used.  A contrast dye may be injected into the facet joint area to help locate the joint.  When the joint is located, an anesthetic will be injected into the joint through the needle.  Your health care provider will ask you whether you feel pain relief. If you do feel relief, a steroid may be injected to provide pain relief for a longer period of time. If you do not feel relief or feel only partial relief, additional injections of an anesthetic may be made in other facet joints.  The needle will be removed.  Your skin will be cleaned.  A bandage (dressing) will be applied over each injection site. The procedure may vary among health care providers and hospitals. What happens after the procedure?  You will be observed for 15-30 minutes before being allowed to go home. This information is not intended to replace advice given to you by your health care  provider. Make sure you discuss any questions you have with your health care provider. Document Released: 10/06/2006 Document Revised: 06/18/2015 Document Reviewed: 02/10/2015 Elsevier Interactive Patient Education  2017 [place] After Refer to this sheet in the next few weeks. These instructions provide you with information about caring for yourself after your procedure. Your health care provider may also give you more specific instructions. Your treatment has been planned according to current medical practices, but problems sometimes occur. Call your health care provider if you have any problems or questions after your procedure. What can I expect after the procedure? After the procedure, it is common to have:  Some tenderness over the injection sites for 2 days after the procedure.  A temporary increase in blood sugar if you have diabetes. Follow these instructions at home:  Keep track of the amount of pain relief you feel and how long it lasts.  Take over-the-counter and prescription medicines only as  told by your health care provider. You may need to limit pain medicine within the first 4-6 hours after the procedure.  Remove your bandages (dressings) the morning after the procedure.  For the first 24 hours after the procedure:  Do not apply heat near or over the injection sites.  Do not take a bath or soak in water, such as in a pool or lake.  Do not drive or operate heavy machinery unless approved by your health care provider.  Avoid activities that require a lot of energy.  If the injection site is tender, try applying ice to the area. To do this:  Put ice in a plastic bag.  Place a towel between your skin and the bag.  Leave the ice on for 20 minutes, 2-3 times a day.  Keep all follow-up visits as told by your health care provider. This is important. Contact a health care provider if:  Fluid is coming from an injection site.  There is  significant bleeding or swelling at an injection site.  You have diabetes and your blood sugar is above 180 mg/dL. Get help right away if:  You have a fever.  You have worsening pain or swelling around an injection site.  There are red streaks around an injection site.  You develop severe pain that is not controlled by your medicines.  You develop a headache, stiff neck, nausea, or vomiting.  Your eyes become very sensitive to light.  You have weakness, paralysis, or tingling in your arms or legs that was not present before the procedure.  You have difficulty urinating or breathing. This information is not intended to replace advice given to you by your health care provider. Make sure you discuss any questions you have with your health care provider. Document Released: 05/03/2012 Document Revised: 10/01/2015 Document Reviewed: 02/10/2015 Elsevier Interactive Patient Education  2017 [place]  What are the risk, side effects and possible complications? Generally speaking, most procedures are safe.  However, with any procedure there are risks, side effects, and the possibility of complications.  The risks and complications are dependent upon the sites that are lesioned, or the type of nerve block to be performed.  The closer the procedure is to the spine, the more serious the risks are.  Great care is taken when placing the radio frequency needles, block needles or lesioning probes, but sometimes complications can occur. 1. Infection: Any time there is an injection through the skin, there is a risk of infection.  This is why sterile conditions are used for these blocks.  There are four possible types of infection. 1. Localized skin infection. 2. Central Nervous System Infection-This can be in the form of Meningitis, which can be deadly. 3. Epidural Infections-This can be in the form of an epidural abscess, which can cause pressure inside of the spine,  causing compression of the spinal cord with subsequent paralysis. This would require an emergency surgery to decompress, and there are no guarantees that the patient would recover from the paralysis. 4. Discitis-This is an infection of the intervertebral discs.  It occurs in about 1% of discography procedures.  It is difficult to treat and it may lead to surgery.        2. Pain: the needles have to go through skin and soft tissues, will cause soreness.       3. Damage to internal structures:  The nerves to be lesioned may be near blood vessels or  other nerves which can be potentially damaged.       4. Bleeding: Bleeding is more common if the patient is taking blood thinners such as  aspirin, Coumadin, Ticiid, Plavix, etc., or if he/she have some genetic predisposition  such as hemophilia. Bleeding into the spinal canal can cause compression of the spinal  cord with subsequent paralysis.  This would require an emergency surgery to  decompress and there are no guarantees that the patient would recover from the  paralysis.       5. Pneumothorax:  Puncturing of a lung is a possibility, every time a needle is introduced in  the area of the chest or upper back.  Pneumothorax refers to free air around the  collapsed lung(s), inside of the thoracic cavity (chest cavity).  Another two possible  complications related to a similar event would include: Hemothorax and Chylothorax.   These are variations of the Pneumothorax, where instead of air around the collapsed  lung(s), you may have blood or chyle, respectively.       6. Spinal headaches: They may occur with any procedures in the area of the spine.       7. Persistent CSF (Cerebro-Spinal Fluid) leakage: This is a rare problem, but may occur  with prolonged intrathecal or epidural catheters either due to the formation of a fistulous  track or a dural tear.       8. Nerve damage: By working so close to the spinal cord, there is always a possibility of  nerve  damage, which could be as serious as a permanent spinal cord injury with  paralysis.       9. Death:  Although rare, severe deadly allergic reactions known as "Anaphylactic  reaction" can occur to any of the medications used.      10. Worsening of the symptoms:  We can always make thing worse.  What are the chances of something like this happening? Chances of any of this occuring are extremely low.  By statistics, you have more of a chance of getting killed in a motor vehicle accident: while driving to the hospital than any of the above occurring .  Nevertheless, you should be aware that they are possibilities.  In general, it is similar to taking a shower.  Everybody knows that you can slip, hit your head and get killed.  Does that mean that you should not shower again?  Nevertheless always keep in mind that statistics do not mean anything if you happen to be on the wrong side of them.  Even if a procedure has a 1 (one) in a 1,000,000 (million) chance of going wrong, it you happen to be that one..Also, keep in mind that by statistics, you have more of a chance of having something go wrong when taking medications.  Who should not have this procedure? If you are on a blood thinning medication (e.g. Coumadin, Plavix, see list of "Blood Thinners"), or if you have an active infection going on, you should not have the procedure.  If you are taking any blood thinners, please inform your physician.  How should I prepare for this procedure?  Do not eat or drink anything at least six hours prior to the procedure.  Bring a driver with you .  It cannot be a taxi.  Come accompanied by an adult that can drive you back, and that is strong enough to help you if your legs get weak or numb from the local anesthetic.  Take all  of your medicines the morning of the procedure with just enough water to swallow them.  If you have diabetes, make sure that you are scheduled to have your procedure done first thing in the  morning, whenever possible.  If you have diabetes, take only half of your insulin dose and notify our nurse that you have done so as soon as you arrive at the clinic.  If you are diabetic, but only take blood sugar pills (oral hypoglycemic), then do not take them on the morning of your procedure.  You may take them after you have had the procedure.  Do not take aspirin or any aspirin-containing medications, at least eleven (11) days prior to the procedure.  They may prolong bleeding.  Wear loose fitting clothing that may be easy to take off and that you would not mind if it got stained with Betadine or blood.  Do not wear any jewelry or perfume  Remove any nail coloring.  It will interfere with some of our monitoring equipment.  NOTE: Remember that this is not meant to be interpreted as a complete list of all possible complications.  Unforeseen problems may occur.  BLOOD THINNERS The following drugs contain aspirin or other products, which can cause increased bleeding during surgery and should not be taken for 2 weeks prior to and 1 week after surgery.  If you should need take something for relief of minor pain, you may take acetaminophen which is found in Tylenol,m Datril, Anacin-3 and Panadol. It is not blood thinner. The products listed below are.  Do not take any of the products listed below in addition to any listed on your instruction sheet.  A.P.C or A.P.C with Codeine Codeine Phosphate Capsules #3 Ibuprofen Ridaura  ABC compound Congesprin Imuran rimadil  Advil Cope Indocin Robaxisal  Alka-Seltzer Effervescent Pain Reliever and Antacid Coricidin or Coricidin-D  Indomethacin Rufen  Alka-Seltzer plus Cold Medicine Cosprin Ketoprofen S-A-C Tablets  Anacin Analgesic Tablets or Capsules Coumadin Korlgesic Salflex  Anacin Extra Strength Analgesic tablets or capsules CP-2 Tablets Lanoril Salicylate  Anaprox Cuprimine Capsules Levenox Salocol  Anexsia-D Dalteparin Magan Salsalate    Anodynos Darvon compound Magnesium Salicylate Sine-off  Ansaid Dasin Capsules Magsal Sodium Salicylate  Anturane Depen Capsules Marnal Soma  APF Arthritis pain formula Dewitt's Pills Measurin Stanback  Argesic Dia-Gesic Meclofenamic Sulfinpyrazone  Arthritis Bayer Timed Release Aspirin Diclofenac Meclomen Sulindac  Arthritis pain formula Anacin Dicumarol Medipren Supac  Analgesic (Safety coated) Arthralgen Diffunasal Mefanamic Suprofen  Arthritis Strength Bufferin Dihydrocodeine Mepro Compound Suprol  Arthropan liquid Dopirydamole Methcarbomol with Aspirin Synalgos  ASA tablets/Enseals Disalcid Micrainin Tagament  Ascriptin Doan's Midol Talwin  Ascriptin A/D Dolene Mobidin Tanderil  Ascriptin Extra Strength Dolobid Moblgesic Ticlid  Ascriptin with Codeine Doloprin or Doloprin with Codeine Momentum Tolectin  Asperbuf Duoprin Mono-gesic Trendar  Aspergum Duradyne Motrin or Motrin IB Triminicin  Aspirin plain, buffered or enteric coated Durasal Myochrisine Trigesic  Aspirin Suppositories Easprin Nalfon Trillsate  Aspirin with Codeine Ecotrin Regular or Extra Strength Naprosyn Uracel  Atromid-S Efficin Naproxen Ursinus  Auranofin Capsules Elmiron Neocylate Vanquish  Axotal Emagrin Norgesic Verin  Azathioprine Empirin or Empirin with Codeine Normiflo Vitamin E  Azolid Emprazil Nuprin Voltaren  Bayer Aspirin plain, buffered or children's or timed BC Tablets or powders Encaprin Orgaran Warfarin Sodium  Buff-a-Comp Enoxaparin Orudis Zorpin  Buff-a-Comp with Codeine Equegesic Os-Cal-Gesic   Buffaprin Excedrin plain, buffered or Extra Strength Oxalid   Bufferin Arthritis Strength Feldene Oxphenbutazone   Bufferin plain or Extra Strength Feldene Capsules  Oxycodone with Aspirin   Bufferin with Codeine Fenoprofen Fenoprofen Pabalate or Pabalate-SF   Buffets II Flogesic Panagesic   Buffinol plain or Extra Strength Florinal or Florinal with Codeine Panwarfarin   Buf-Tabs Flurbiprofen  Penicillamine   Butalbital Compound Four-way cold tablets Penicillin   Butazolidin Fragmin Pepto-Bismol   Carbenicillin Geminisyn Percodan   Carna Arthritis Reliever Geopen Persantine   Carprofen Gold's salt Persistin   Chloramphenicol Goody's Phenylbutazone   Chloromycetin Haltrain Piroxlcam   Clmetidine heparin Plaquenil   Cllnoril Hyco-pap Ponstel   Clofibrate Hydroxy chloroquine Propoxyphen         Before stopping any of these medications, be sure to consult the physician who ordered them.  Some, such as Coumadin (Warfarin) are ordered to prevent or treat serious conditions such as "deep thrombosis", "pumonary embolisms", and other heart problems.  The amount of time that you may need off of the medication may also vary with the medication and the reason for which you were taking it.  If you are taking any of these medications, please make sure you notify your pain physician before you undergo any procedures.

## 2016-10-27 ENCOUNTER — Telehealth: Payer: Self-pay

## 2016-10-27 NOTE — Telephone Encounter (Signed)
Denies any needs at this time- States she is doing well. Instructed to call if needed

## 2016-11-22 ENCOUNTER — Ambulatory Visit: Payer: BC Managed Care – PPO | Admitting: Anesthesiology

## 2016-12-16 ENCOUNTER — Ambulatory Visit: Payer: BC Managed Care – PPO | Attending: Anesthesiology | Admitting: Anesthesiology

## 2016-12-16 ENCOUNTER — Encounter: Payer: Self-pay | Admitting: Anesthesiology

## 2016-12-16 VITALS — BP 116/61 | HR 99 | Temp 97.7°F | Resp 18 | Ht 66.0 in | Wt 178.0 lb

## 2016-12-16 DIAGNOSIS — Z825 Family history of asthma and other chronic lower respiratory diseases: Secondary | ICD-10-CM | POA: Insufficient documentation

## 2016-12-16 DIAGNOSIS — F329 Major depressive disorder, single episode, unspecified: Secondary | ICD-10-CM | POA: Insufficient documentation

## 2016-12-16 DIAGNOSIS — M545 Low back pain, unspecified: Secondary | ICD-10-CM

## 2016-12-16 DIAGNOSIS — K589 Irritable bowel syndrome without diarrhea: Secondary | ICD-10-CM | POA: Diagnosis not present

## 2016-12-16 DIAGNOSIS — R42 Dizziness and giddiness: Secondary | ICD-10-CM | POA: Insufficient documentation

## 2016-12-16 DIAGNOSIS — Z818 Family history of other mental and behavioral disorders: Secondary | ICD-10-CM | POA: Insufficient documentation

## 2016-12-16 DIAGNOSIS — Z811 Family history of alcohol abuse and dependence: Secondary | ICD-10-CM | POA: Insufficient documentation

## 2016-12-16 DIAGNOSIS — Z813 Family history of other psychoactive substance abuse and dependence: Secondary | ICD-10-CM | POA: Diagnosis not present

## 2016-12-16 DIAGNOSIS — Z87891 Personal history of nicotine dependence: Secondary | ICD-10-CM | POA: Diagnosis not present

## 2016-12-16 DIAGNOSIS — M4696 Unspecified inflammatory spondylopathy, lumbar region: Secondary | ICD-10-CM

## 2016-12-16 DIAGNOSIS — Z809 Family history of malignant neoplasm, unspecified: Secondary | ICD-10-CM | POA: Diagnosis not present

## 2016-12-16 DIAGNOSIS — R601 Generalized edema: Secondary | ICD-10-CM | POA: Insufficient documentation

## 2016-12-16 DIAGNOSIS — F419 Anxiety disorder, unspecified: Secondary | ICD-10-CM | POA: Diagnosis not present

## 2016-12-16 DIAGNOSIS — M488X6 Other specified spondylopathies, lumbar region: Secondary | ICD-10-CM | POA: Diagnosis not present

## 2016-12-16 DIAGNOSIS — J329 Chronic sinusitis, unspecified: Secondary | ICD-10-CM | POA: Insufficient documentation

## 2016-12-16 DIAGNOSIS — K219 Gastro-esophageal reflux disease without esophagitis: Secondary | ICD-10-CM | POA: Diagnosis not present

## 2016-12-16 DIAGNOSIS — M5136 Other intervertebral disc degeneration, lumbar region: Secondary | ICD-10-CM | POA: Diagnosis not present

## 2016-12-16 DIAGNOSIS — F1190 Opioid use, unspecified, uncomplicated: Secondary | ICD-10-CM | POA: Diagnosis not present

## 2016-12-16 DIAGNOSIS — G4700 Insomnia, unspecified: Secondary | ICD-10-CM | POA: Insufficient documentation

## 2016-12-16 DIAGNOSIS — Z79899 Other long term (current) drug therapy: Secondary | ICD-10-CM | POA: Insufficient documentation

## 2016-12-16 DIAGNOSIS — G8929 Other chronic pain: Secondary | ICD-10-CM | POA: Diagnosis not present

## 2016-12-16 DIAGNOSIS — Z823 Family history of stroke: Secondary | ICD-10-CM | POA: Insufficient documentation

## 2016-12-16 DIAGNOSIS — M47816 Spondylosis without myelopathy or radiculopathy, lumbar region: Secondary | ICD-10-CM

## 2016-12-16 MED ORDER — HYDROCODONE-ACETAMINOPHEN 5-325 MG PO TABS: 1.0000 | ORAL_TABLET | Freq: Four times a day (QID) | ORAL | 0 refills | Status: DC | PRN

## 2016-12-16 NOTE — Patient Instructions (Signed)
You were given one prescription for Hydrocodone today.  Facet Blocks Patient Information  Description: The facets are joints in the spine between the vertebrae.  Like any joints in the body, facets can become irritated and painful.  Arthritis can also effect the facets.  By injecting steroids and local anesthetic in and around these joints, we can temporarily block the nerve supply to them.  Steroids act directly on irritated nerves and tissues to reduce selling and inflammation which often leads to decreased pain.  Facet blocks may be done anywhere along the spine from the neck to the low back depending upon the location of your pain.   After numbing the skin with local anesthetic (like Novocaine), a small needle is passed onto the facet joints under x-ray guidance.  You may experience a sensation of pressure while this is being done.  The entire block usually lasts about 15-25 minutes.   Conditions which may be treated by facet blocks:   Low back/buttock pain  Neck/shoulder pain  Certain types of headaches  Preparation for the injection:  1. Do not eat any solid food or dairy products within 8 hours of your appointment. 2. You may drink clear liquid up to 3 hours before appointment.  Clear liquids include water, black coffee, juice or soda.  No milk or cream please. 3. You may take your regular medication, including pain medications, with a sip of water before your appointment.  Diabetics should hold regular insulin (if taken separately) and take 1/2 normal NPH dose the morning of the procedure.  Carry some sugar containing items with you to your appointment. 4. A driver must accompany you and be prepared to drive you home after your procedure. 5. Bring all your current medications with you. 6. An IV may be inserted and sedation may be given at the discretion of the physician. 7. A blood pressure cuff, EKG and other monitors will often be applied during the procedure.  Some patients may  need to have extra oxygen administered for a short period. 8. You will be asked to provide medical information, including your allergies and medications, prior to the procedure.  We must know immediately if you are taking blood thinners (like Coumadin/Warfarin) or if you are allergic to IV iodine contrast (dye).  We must know if you could possible be pregnant.  Possible side-effects:   Bleeding from needle site  Infection (rare, may require surgery)  Nerve injury (rare)  Numbness & tingling (temporary)  Difficulty urinating (rare, temporary)  Spinal headache (a headache worse with upright posture)  Light-headedness (temporary)  Pain at injection site (serveral days)  Decreased blood pressure (rare, temporary)  Weakness in arm/leg (temporary)  Pressure sensation in back/neck (temporary)   Call if you experience:   Fever/chills associated with headache or increased back/neck pain  Headache worsened by an upright position  New onset, weakness or numbness of an extremity below the injection site  Hives or difficulty breathing (go to the emergency room)  Inflammation or drainage at the injection site(s)  Severe back/neck pain greater than usual  New symptoms which are concerning to you  Please note:  Although the local anesthetic injected can often make your back or neck feel good for several hours after the injection, the pain will likely return. It takes 3-7 days for steroids to work.  You may not notice any pain relief for at least one week.  If effective, we will often do a series of 2-3 injections spaced 3-6 weeks apart  to maximally decrease your pain.  After the initial series, you may be a candidate for a more permanent nerve block of the facets.  If you have any questions, please call #[phone] [facility]

## 2016-12-16 NOTE — Progress Notes (Signed)
Nursing Pain Medication Assessment:  Safety precautions to be maintained throughout the outpatient stay will include: orient to surroundings, keep bed in low position, maintain call bell within reach at all times, provide assistance with transfer out of bed and ambulation.  Medication Inspection Compliance: Pill count conducted under aseptic conditions, in front of the patient. Neither the pills nor the bottle was removed from the patient's sight at any time. Once count was completed pills were immediately returned to the patient in their original bottle.  Medication: Hydrocodone/APAP Pill/Patch Count: 45 of 45 pills remain Pill/Patch Appearance: Markings consistent with prescribed medication Bottle Appearance: Standard pharmacy container. Clearly labeled. Filled Date: 06/27 / 2018 Last Medication intake:  Day before yesterday

## 2016-12-19 NOTE — Progress Notes (Signed)
Subjective:  Patient ID: Katherine Lester, female    DOB: [birth year]  Age: 61 y.o. MRN: [id number]  CC: Back Pain (right, lower) and Hip Pain (hip)   Service Provided on Last Visit: Procedure Procedure: None  Previous PROCEDURE:Bilateral L3-4 L4-5 L5-S1 and S1 medial branch block to the facet nerves under fluoroscopic guidance with moderate sedation #2 Previous procedure 08/16/2016 Bilateral L3-4 L4-5 L5-S1 and S1 medial branch block to the facet nerves of the lumbar spine under fluoroscopic guidance with moderate sedation  HPI [name] presents for reevaluation. At her last visit she had a bilateral lumbar facet block that gave her excellent relief. These generally give her approximately 2 months of relief. She's had some gradual recurrence of the same quality characteristic and distribution of low back pain and posterior thigh pain. No change in lower extremity strength or function or bowel or bladder function are noted. She's taking sparing doses of Vicodin 5 tablets generally lasting 1-2 months with 45 tablets. She uses these on days where she has more severe pain. The quality characteristic and distribution have been stable otherwise today she is in her usual state of health. Based on her narcotic assessment sheet she continues to derive good lifestyle improvement with her medication management. With no untoward side effects.   History Afra has a past medical history of Allergy; Anxiety; Chronic back pain; Depression; Generalized edema; GERD (gastroesophageal reflux disease); IBS (irritable bowel syndrome); Insomnia; Irritable bowel syndrome; Positional vertigo; Sinusitis; and Spastic colon.   She has a past surgical history that includes Abdominal hysterectomy; Appendectomy; Fracture surgery (Left); Dilation and curettage of uterus; and Breast biopsy (Right).   Her family history includes Alcohol abuse in her brother; COPD in her mother; Cancer in her mother; Depression in  her mother; Drug abuse in her brother; Heart disease in her maternal grandmother and mother; Mental illness in her brother; Stroke in her mother.She reports that she quit smoking about 3 years ago. She quit after 20.00 years of use. She has never used smokeless tobacco. She reports that she does not drink alcohol or use drugs.  Results for orders placed in visit on 01/07/14  [place] [facility]   Narrative * PRIOR REPORT IMPORTED FROM AN EXTERNAL SYSTEM *   CLINICAL DATA:  Low back, right leg and right hip pain.   EXAM:  MRI LUMBAR SPINE WITHOUT CONTRAST   TECHNIQUE:  Multiplanar, multisequence MR imaging of the lumbar spine was  performed. No intravenous contrast was administered.   COMPARISON:  None.   FINDINGS:  Vertebral body height, signal and alignment are maintained.  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 The conus medullaris is normal in signal  and position. Imaged intra-abdominal contents are unremarkable.   The T11-12 and T12-L1 levels are imaged in the sagittal plane only  and negative.   L1-2:  Negative.   L2-3:  Negative.   L3-4:  Negative.   L4-5: Minimal disc bulge without central canal or foraminal  narrowing.   L5-S1: Minimal disc bulge without central canal or foraminal  narrowing. 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 No nerve root compression is identified.    Electronically Signed    By: Inge Rise M.D.    On: 01/07/2014 10:46  [facility] Select 13  Date Value Ref Range Status  07/28/2016 FINAL  Final    Comment:    ==================================================================== [facility] SELECT 13 (MW) ==================================================================== Test                             Result        Flag       Units Drug Present and Declared for Prescription Verification   Hydrocodone                    173          EXPECTED   ng/mg creat   Norhydrocodone                 314          EXPECTED   ng/mg creat    Sources of hydrocodone include scheduled prescription    medications. Norhydrocodone is an expected metabolite of    hydrocodone. Drug Absent but Declared for Prescription Verification   Codeine                        Not Detected UNEXPECTED ng/mg creat ==================================================================== Test                      Result    Flag   Units      Ref Range   Creatinine              146              mg/dL      >=20 ==================================================================== Declared Medications:  The flagging and interpretation on this report are based on the  following declared medications.  Unexpected results may arise from  inaccuracies in the declared medications.  **Note: The testing scope of this panel includes these medications:  Codeine (Cheratussin AC)  Hydrocodone  Hydrocodone (Hydrocodone-Acetaminophen)  **Note: The testing scope of this panel does not include following  reported medications:  Acetaminophen (Hydrocodone-Acetaminophen)  Amoxicillin  Amoxicillin (Augmentin)  Clavulinate (Augmentin)  Cyclobenzaprine  Diclofenac (Flector)  Diclofenac (Voltaren)  Docusate (Colace)  Docusate (Senokot-S)  Doxycycline  Duloxetine  Guaifenesin (Cheratussin AC)  Omeprazole (Nexium)  Sennosides (Senokot-S)  Sodium Chloride  Triamcinolone (Kenalog)  Vitamin D2 (Ergocalciferol)  Vitamin D3  Zolpidem (Ambien) ==================================================================== For clinical consultation, please call [phone]. ====================================================================     Outpatient Medications Prior to Visit  Medication Sig Dispense Refill  . cetirizine (ZYRTEC) 10 MG tablet Take 10 mg by  mouth.    . Cholecalciferol (VITAMIN D3) 10000 UNITS capsule Take 50,000 Units by mouth once a week.     . cyclobenzaprine (FLEXERIL) 10 MG tablet Limit 1 tab by mouth 2-4 times a day if tolerated 100 tablet 0  . DULoxetine (CYMBALTA) 20 MG capsule Limit 1 capsule by mouth per day if tolerated 30 capsule 0  . fluticasone (FLONASE) 50 MCG/ACT nasal spray     . pantoprazole (PROTONIX) 40 MG tablet Take 40 mg by mouth daily.     . polyethylene glycol (MIRALAX / GLYCOLAX) packet     . zolpidem (AMBIEN) 5 MG tablet Take 5 mg by mouth at bedtime.    Marland Kitchen HYDROcodone-acetaminophen (NORCO/VICODIN) 5-325 MG tablet Take 1 tablet by mouth every 6 (six) hours as needed for moderate pain. 45 tablet 0  . sucralfate (CARAFATE) 1 g tablet     . Vitamin D, Ergocalciferol, (DRISDOL) 50000 units  CAPS capsule Take by mouth.     Facility-Administered Medications Prior to Visit  Medication Dose Route Frequency Provider Last Rate Last Dose  . bupivacaine (PF) (MARCAINE) 0.25 % injection 30 mL  30 mL Other Once Mohammed Kindle, MD      . bupivacaine (PF) (MARCAINE) 0.25 % injection 30 mL  30 mL Other Once Mohammed Kindle, MD      . bupivacaine (PF) (MARCAINE) 0.25 % injection 30 mL  30 mL Other Once Mohammed Kindle, MD      . bupivacaine (PF) (MARCAINE) 0.25 % injection 30 mL  30 mL Other Once Mohammed Kindle, MD      . fentaNYL (SUBLIMAZE) injection 100 mcg  100 mcg Intravenous Once Mohammed Kindle, MD      . fentaNYL (SUBLIMAZE) injection 100 mcg  100 mcg Intravenous Once Mohammed Kindle, MD      . lactated ringers infusion 1,000 mL  1,000 mL Intravenous Continuous Mohammed Kindle, MD      . lactated ringers infusion 1,000 mL  1,000 mL Intravenous Continuous Mohammed Kindle, MD      . lactated ringers infusion 1,000 mL  1,000 mL Intravenous Continuous Mohammed Kindle, MD 125 mL/hr at 10/15/15 0923 1,000 mL at 10/15/15 0923  . lactated ringers infusion 1,000 mL  1,000 mL Intravenous Continuous Mohammed Kindle, MD 125 mL/hr at  01/12/16 0950 1,000 mL at 01/12/16 0950  . midazolam (VERSED) 5 MG/5ML injection 5 mg  5 mg Intravenous Once Mohammed Kindle, MD      . midazolam (VERSED) 5 MG/5ML injection 5 mg  5 mg Intravenous Once Mohammed Kindle, MD      . orphenadrine (NORFLEX) injection 60 mg  60 mg Intramuscular Once Mohammed Kindle, MD      . orphenadrine (NORFLEX) injection 60 mg  60 mg Intramuscular Once Mohammed Kindle, MD      . orphenadrine (NORFLEX) injection 60 mg  60 mg Intramuscular Once Mohammed Kindle, MD      . orphenadrine (NORFLEX) injection 60 mg  60 mg Intramuscular Once Mohammed Kindle, MD      . sodium chloride flush (NS) 0.9 % injection 20 mL  20 mL Other Once Mohammed Kindle, MD      . sodium chloride flush (NS) 0.9 % injection 20 mL  20 mL Other Once Mohammed Kindle, MD      . triamcinolone acetonide (KENALOG-40) injection 40 mg  40 mg Other Once Mohammed Kindle, MD      . triamcinolone acetonide (KENALOG-40) injection 40 mg  40 mg Other Once Mohammed Kindle, MD      . triamcinolone acetonide (KENALOG-40) injection 40 mg  40 mg Other Once Mohammed Kindle, MD      . triamcinolone acetonide (KENALOG-40) injection 40 mg  40 mg Other Once Mohammed Kindle, MD      . triamcinolone acetonide (KENALOG-40) injection 40 mg  40 mg Other Once Mohammed Kindle, MD       No results found for: WBC, HGB, HCT, PLT, GLUCOSE, CHOL, TRIG, HDL, LDLDIRECT, LDLCALC, ALT, AST, NA, K, CL, CREATININE, BUN, CO2, TSH, PSA, INR, GLUF, HGBA1C, MICROALBUR  --------------------------------------------------------------------------------------------------------------------- Mm Digital Screening Bilateral  Result Date: 04/07/2016 CLINICAL DATA:  Screening. EXAM: DIGITAL SCREENING BILATERAL MAMMOGRAM WITH CAD COMPARISON:  Previous exam(s). ACR Breast Density Category b: There are scattered areas of fibroglandular density. FINDINGS: There are no findings suspicious for malignancy. Images were processed with CAD. IMPRESSION: No mammographic evidence  of malignancy. A result letter of this screening mammogram will be mailed  directly to the patient. RECOMMENDATION: Screening mammogram in one year. (Code:SM-B-01Y) BI-RADS CATEGORY  1: Negative. Electronically Signed   By: Nolon Nations M.D.   On: 04/07/2016 09:57       ---------------------------------------------------------------------------------------------------------------------- Past Medical History:  Diagnosis Date  . Allergy   . Anxiety   . Chronic back pain   . Depression   . Generalized edema   . GERD (gastroesophageal reflux disease)   . IBS (irritable bowel syndrome)   . Insomnia   . Irritable bowel syndrome   . Positional vertigo   . Sinusitis   . Spastic colon     Past Surgical History:  Procedure Laterality Date  . ABDOMINAL HYSTERECTOMY    . APPENDECTOMY    . BREAST BIOPSY Right    stereo bx-neg  . DILATION AND CURETTAGE OF UTERUS    . FRACTURE SURGERY Left    arm    Family History  Problem Relation Age of Onset  . Cancer Mother   . COPD Mother   . Depression Mother   . Heart disease Mother   . Stroke Mother   . Drug abuse Brother   . Alcohol abuse Brother   . Mental illness Brother   . Heart disease Maternal Grandmother   . Breast cancer Neg Hx     Social History  Substance Use Topics  . Smoking status: Former Smoker    Years: 20.00    Quit date: 10/14/2013  . Smokeless tobacco: Never Used  . Alcohol use No    ---------------------------------------------------------------------------------------------------------------------    BP 116/61   Pulse 99   Temp 97.7 F (36.5 C) (Oral)   Resp 18   Ht 5\' 6"  (1.676 m)   Wt 178 lb (80.7 kg)   SpO2 100%   BMI 28.73 kg/m    BP Readings from Last 3 Encounters:  12/16/16 116/61  10/26/16 118/65  10/14/16 131/85     Wt Readings from Last 3 Encounters:  12/16/16 178 lb (80.7 kg)  10/26/16 174 lb (78.9 kg)  10/14/16 176 lb (79.8 kg)      ----------------------------------------------------------------------------------------------------------------------  ROS Review of Systems  Cardiac: No angina Psychiatric: No history of drug abuse or suicidal or homicidal ideation. GI no constipationOtherwise no changes noted  Objective:  BP 116/61   Pulse 99   Temp 97.7 F (36.5 C) (Oral)   Resp 18   Ht 5\' 6"  (1.676 m)   Wt 178 lb (80.7 kg)   SpO2 100%   BMI 28.73 kg/m   Physical Exam Pupils are equally round reactive to light extraocular muscles are intact asked line heart is regular rate and rhythm without murmur Lungs are clear to auscultation She is having some mild paraspinous muscle tenderness but no overt trigger points. She has some mild pain with extension at the low back in the standing position with bilateral rotation. This is not as severe as it was on previous evaluation.    Assessment & Plan:   Jaselle was seen today for back pain and hip pain.  Diagnoses and all orders for this visit:  Facet syndrome, lumbar ([place]) -     LUMBAR FACET(MEDIAL [place]) MBNB; Future  Chronic bilateral low back pain without sciatica  Degenerative disc disease, lumbar  Chronic, continuous use of opioids  Other orders -     HYDROcodone-acetaminophen (NORCO/VICODIN) 5-325 MG tablet; Take 1 tablet by mouth every 6 (six) hours as needed for moderate pain.     ----------------------------------------------------------------------------------------------------------------------  Problem List Items Addressed  This Visit      Musculoskeletal and Integument   Degenerative disc disease, lumbar   Relevant Medications   HYDROcodone-acetaminophen (NORCO/VICODIN) 5-325 MG tablet   Facet syndrome, lumbar ([facility]) - Primary   Relevant Medications   HYDROcodone-acetaminophen (NORCO/VICODIN) 5-325 MG tablet   Other Relevant Orders   LUMBAR FACET(MEDIAL BRANCH NERVE BLOCK) MBNB    Other Visit Diagnoses    Chronic  bilateral low back pain without sciatica       Relevant Medications   HYDROcodone-acetaminophen (NORCO/VICODIN) 5-325 MG tablet   Chronic, continuous use of opioids          ----------------------------------------------------------------------------------------------------------------------  1. Degenerative disc disease, lumbar Continue with back stretching strengthening exercises as reviewed with her today and aerobic conditioning.   2. Facet syndrome, lumbar We will defer on repeat injection today. I'm going to schedule her for a return visit in 2 months where we may proceed with a bilateral lumbar facet block for therapeutic purpose at that time. She is to continue with her conservative management and physical therapy exercises however she has failed conservative therapy and is considered a nonsurgical candidate. Hopefully the injections will enable her to continue to use her Vicodin sparingly. 3. Chronic bilateral low back pain without sciatica We have reviewed the [facility] practitioner database and it is appropriate.      ----------------------------------------------------------------------------------------------------------------------  I am having Ms. Lovena Le maintain her Vitamin D3, zolpidem, cyclobenzaprine, DULoxetine, Vitamin D (Ergocalciferol), pantoprazole, cetirizine, fluticasone, polyethylene glycol, sucralfate, and HYDROcodone-acetaminophen. We will continue to administer bupivacaine (PF), fentaNYL, lactated ringers, midazolam, orphenadrine, triamcinolone acetonide, bupivacaine (PF), fentaNYL, lactated ringers, midazolam, orphenadrine, triamcinolone acetonide, bupivacaine (PF), orphenadrine, triamcinolone acetonide, lactated ringers, sodium chloride flush, triamcinolone acetonide, lactated ringers, bupivacaine (PF), orphenadrine, triamcinolone acetonide, and sodium chloride flush.   Meds ordered this encounter  Medications  . HYDROcodone-acetaminophen  (NORCO/VICODIN) 5-325 MG tablet    Sig: Take 1 tablet by mouth every 6 (six) hours as needed for moderate pain.    Dispense:  45 tablet    Refill:  0    Do not fill until [id number]  Dr. Vashti Hey M.D.   Follow-up: Return in about 2 months (around 02/16/2017) for procedure.   Molli Barrows, MD 4:56 PM  The [place] practitioner database for opioid medications on this patient has been reviewed by me and my staff   Greater than 50% of the total encounter time was spent in counseling and / or coordination of care.     This dictation was performed utilizing Systems analyst.  Please excuse any unintentional or mistaken typographical errors as a result.

## 2017-02-17 ENCOUNTER — Ambulatory Visit (HOSPITAL_BASED_OUTPATIENT_CLINIC_OR_DEPARTMENT_OTHER): Payer: BC Managed Care – PPO | Admitting: Anesthesiology

## 2017-02-17 ENCOUNTER — Other Ambulatory Visit: Payer: Self-pay | Admitting: Anesthesiology

## 2017-02-17 ENCOUNTER — Ambulatory Visit
Admission: RE | Admit: 2017-02-17 | Discharge: 2017-02-17 | Disposition: A | Discharge status: Home / Self Care | Payer: BC Managed Care – PPO | Source: Ambulatory Visit | Attending: Anesthesiology | Admitting: Anesthesiology

## 2017-02-17 ENCOUNTER — Encounter: Payer: Self-pay | Admitting: Anesthesiology

## 2017-02-17 VITALS — BP 122/81 | HR 81 | Temp 96.4°F | Resp 14 | Ht 66.0 in | Wt 178.0 lb

## 2017-02-17 DIAGNOSIS — G8929 Other chronic pain: Secondary | ICD-10-CM

## 2017-02-17 DIAGNOSIS — R52 Pain, unspecified: Secondary | ICD-10-CM

## 2017-02-17 DIAGNOSIS — M545 Low back pain: Secondary | ICD-10-CM | POA: Diagnosis not present

## 2017-02-17 DIAGNOSIS — F1190 Opioid use, unspecified, uncomplicated: Secondary | ICD-10-CM

## 2017-02-17 DIAGNOSIS — M5136 Other intervertebral disc degeneration, lumbar region: Secondary | ICD-10-CM

## 2017-02-17 DIAGNOSIS — M5441 Lumbago with sciatica, right side: Secondary | ICD-10-CM | POA: Diagnosis not present

## 2017-02-17 DIAGNOSIS — Z79899 Other long term (current) drug therapy: Secondary | ICD-10-CM | POA: Diagnosis not present

## 2017-02-17 DIAGNOSIS — M5442 Lumbago with sciatica, left side: Secondary | ICD-10-CM

## 2017-02-17 DIAGNOSIS — Z79891 Long term (current) use of opiate analgesic: Secondary | ICD-10-CM | POA: Insufficient documentation

## 2017-02-17 MED ORDER — HYDROCODONE-ACETAMINOPHEN 5-325 MG PO TABS: 1.0000 | ORAL_TABLET | Freq: Four times a day (QID) | ORAL | 0 refills | Status: DC | PRN

## 2017-02-17 MED ORDER — MIDAZOLAM HCL 5 MG/5ML IJ SOLN
5.0000 mg | Freq: Once | INTRAMUSCULAR | Status: AC
  Administered 2017-02-17: 5 mg via INTRAVENOUS
  Filled 2017-02-17: qty 5

## 2017-02-17 MED ORDER — IOPAMIDOL (ISOVUE-M 200) INJECTION 41%
INTRAMUSCULAR | Status: AC
  Filled 2017-02-17: qty 10

## 2017-02-17 MED ORDER — SODIUM CHLORIDE 0.9 % IJ SOLN
INTRAMUSCULAR | Status: AC
  Filled 2017-02-17: qty 10

## 2017-02-17 MED ORDER — LIDOCAINE HCL (PF) 1 % IJ SOLN
5.0000 mL | Freq: Once | INTRAMUSCULAR | Status: AC
  Administered 2017-02-17: 5 mL via SUBCUTANEOUS
  Filled 2017-02-17: qty 5

## 2017-02-17 MED ORDER — ROPIVACAINE HCL 2 MG/ML IJ SOLN
10.0000 mL | Freq: Once | INTRAMUSCULAR | Status: AC
Start: 2017-02-17 — End: 2017-02-17
  Administered 2017-02-17: 1 mL via EPIDURAL
  Filled 2017-02-17: qty 10

## 2017-02-17 MED ORDER — TRIAMCINOLONE ACETONIDE 40 MG/ML IJ SUSP
40.0000 mg | Freq: Once | INTRAMUSCULAR | Status: AC
  Administered 2017-02-17: 40 mg
  Filled 2017-02-17: qty 1

## 2017-02-17 MED ORDER — SODIUM CHLORIDE 0.9% FLUSH
10.0000 mL | Freq: Once | INTRAVENOUS | Status: AC
  Administered 2017-02-17: 5 mL

## 2017-02-17 MED ORDER — LACTATED RINGERS IV SOLN: 1000.0000 mL | INTRAVENOUS | Status: DC

## 2017-02-17 MED ORDER — IOPAMIDOL (ISOVUE-M 200) INJECTION 41%
20.0000 mL | Freq: Once | INTRAMUSCULAR | Status: DC | PRN
  Administered 2017-02-17: 10 mL
  Filled 2017-02-17: qty 20

## 2017-02-17 NOTE — Patient Instructions (Signed)

## 2017-02-17 NOTE — Progress Notes (Signed)
Nursing Pain Medication Assessment:  Safety precautions to be maintained throughout the outpatient stay will include: orient to surroundings, keep bed in low position, maintain call bell within reach at all times, provide assistance with transfer out of bed and ambulation.  Medication Inspection Compliance: Pill count conducted under aseptic conditions, in front of the patient. Neither the pills nor the bottle was removed from the patient's sight at any time. Once count was completed pills were immediately returned to the patient in their original bottle.  Medication: Hydrocodone/APAP Pill/Patch Count: 6 of 45 pills remain Pill/Patch Appearance: Markings consistent with prescribed medication Bottle Appearance: Standard pharmacy container. Clearly labeled. Filled Date: 08 / 01 / 2018 Last Medication intake:  Yesterday

## 2017-02-18 ENCOUNTER — Telehealth: Payer: Self-pay

## 2017-02-18 NOTE — Progress Notes (Signed)
Subjective:  Patient ID: Katherine Lester, female    DOB: 1955/12/23  Age: 61 y.o. MRN: [id number]  CC: Back Pain (low) and Leg Pain (bilateral, posterior to knees)   Procedure: L5-S1 epidural steroid under fluoroscopic guidance with moderate sedation   HPI Katherine Lester presents for reevaluation. Katherine Lester was last seen approximately 2 months ago and at that time was seen for med refill. Prior to that she had had a bilateral lumbar facet block and has had to these in the past. They generally help with her low back pain and give her approximately 50-75% improvement in her pain symptoms lasting a few months. Unfortunately she has had recurrence of similar back pain but that is now radiating into the bilateral calves with some associated numbness and tingling affecting the right greater than left lower extremities. In the past she's had epidural steroids for this and these have helped significantly. Enable her to have better sleep and be less reliant on her medications. She has a history of chronic low back pain that's been unremitting and she is a nonsurgical candidate. Despite conservative therapy with physical therapy activities she is failed to gain any significant improvement. She still takes Vicodin generally 1-2 tablets per day and this is well tolerated with minimal side effect based on her narcotic assessment sheet. She is requesting an increase in dosing for this. At present she is having trouble sleeping at night and getting comfortable during the day it's impairing her lifestyle and function no bowel or bladder dysfunction is noted and her strength is been well preserved.  Outpatient Medications Prior to Visit  Medication Sig Dispense Refill  . cetirizine (ZYRTEC) 10 MG tablet Take 10 mg by mouth.    . Cholecalciferol (VITAMIN D3) 10000 UNITS capsule Take 50,000 Units by mouth once a week.     . cyclobenzaprine (FLEXERIL) 10 MG tablet Limit 1 tab by mouth 2-4 times a day if tolerated  100 tablet 0  . DULoxetine (CYMBALTA) 20 MG capsule Limit 1 capsule by mouth per day if tolerated 30 capsule 0  . fluticasone (FLONASE) 50 MCG/ACT nasal spray     . pantoprazole (PROTONIX) 40 MG tablet Take 40 mg by mouth daily.     . polyethylene glycol (MIRALAX / GLYCOLAX) packet     . Vitamin D, Ergocalciferol, (DRISDOL) 50000 units CAPS capsule Take by mouth.    . zolpidem (AMBIEN) 5 MG tablet Take 5 mg by mouth at bedtime.    Marland Kitchen HYDROcodone-acetaminophen (NORCO/VICODIN) 5-325 MG tablet Take 1 tablet by mouth every 6 (six) hours as needed for moderate pain. 45 tablet 0  . sucralfate (CARAFATE) 1 g tablet      Facility-Administered Medications Prior to Visit  Medication Dose Route Frequency Provider Last Rate Last Dose  . bupivacaine (PF) (MARCAINE) 0.25 % injection 30 mL  30 mL Other Once Mohammed Kindle, MD      . bupivacaine (PF) (MARCAINE) 0.25 % injection 30 mL  30 mL Other Once Mohammed Kindle, MD      . bupivacaine (PF) (MARCAINE) 0.25 % injection 30 mL  30 mL Other Once Mohammed Kindle, MD      . bupivacaine (PF) (MARCAINE) 0.25 % injection 30 mL  30 mL Other Once Mohammed Kindle, MD      . fentaNYL (SUBLIMAZE) injection 100 mcg  100 mcg Intravenous Once Mohammed Kindle, MD      . fentaNYL (SUBLIMAZE) injection 100 mcg  100 mcg Intravenous Once Mohammed Kindle, MD      .  lactated ringers infusion 1,000 mL  1,000 mL Intravenous Continuous Mohammed Kindle, MD      . lactated ringers infusion 1,000 mL  1,000 mL Intravenous Continuous Mohammed Kindle, MD      . lactated ringers infusion 1,000 mL  1,000 mL Intravenous Continuous Mohammed Kindle, MD 125 mL/hr at 10/15/15 0923 1,000 mL at 10/15/15 0923  . lactated ringers infusion 1,000 mL  1,000 mL Intravenous Continuous Mohammed Kindle, MD 125 mL/hr at 01/12/16 0950 1,000 mL at 01/12/16 0950  . midazolam (VERSED) 5 MG/5ML injection 5 mg  5 mg Intravenous Once Mohammed Kindle, MD      . midazolam (VERSED) 5 MG/5ML injection 5 mg  5 mg Intravenous  Once Mohammed Kindle, MD      . orphenadrine (NORFLEX) injection 60 mg  60 mg Intramuscular Once Mohammed Kindle, MD      . orphenadrine (NORFLEX) injection 60 mg  60 mg Intramuscular Once Mohammed Kindle, MD      . orphenadrine (NORFLEX) injection 60 mg  60 mg Intramuscular Once Mohammed Kindle, MD      . orphenadrine (NORFLEX) injection 60 mg  60 mg Intramuscular Once Mohammed Kindle, MD      . sodium chloride flush (NS) 0.9 % injection 20 mL  20 mL Other Once Mohammed Kindle, MD      . sodium chloride flush (NS) 0.9 % injection 20 mL  20 mL Other Once Mohammed Kindle, MD      . triamcinolone acetonide (KENALOG-40) injection 40 mg  40 mg Other Once Mohammed Kindle, MD      . triamcinolone acetonide (KENALOG-40) injection 40 mg  40 mg Other Once Mohammed Kindle, MD      . triamcinolone acetonide (KENALOG-40) injection 40 mg  40 mg Other Once Mohammed Kindle, MD      . triamcinolone acetonide (KENALOG-40) injection 40 mg  40 mg Other Once Mohammed Kindle, MD      . triamcinolone acetonide (KENALOG-40) injection 40 mg  40 mg Other Once Mohammed Kindle, MD        Review of Systems Cardiac: No angina or palpitations Pulmonary: No shortness of breath or dyspnea GI: No constipation or abdominal pain CNS: No confusion or dizziness  Objective:  BP 122/81   Pulse 81   Temp (!) 96.4 F (35.8 C)   Resp 14   Ht 5\' 6"  (1.676 m)   Wt 178 lb (80.7 kg)   SpO2 99%   BMI 28.73 kg/m    BP Readings from Last 3 Encounters:  02/17/17 122/81  12/16/16 116/61  10/26/16 118/65     Wt Readings from Last 3 Encounters:  02/17/17 178 lb (80.7 kg)  12/16/16 178 lb (80.7 kg)  10/26/16 174 lb (78.9 kg)     Physical Exam Pt is alert and oriented PERRL EOMI HEART IS RRR no murmur or rub LCTA no wheezing or rhales MUSCULOSKELETAL reveals some paraspinous muscle tenderness but no overt trigger points in the low back. She still has some mild pain on extension at the low back. She does report findings consistent  with a straight leg raise right side greater than left and her muscle tone and bulk is good. She is ambulate well although her gait is a bit antalgic  Labs  No results found for: HGBA1C No results found for: GLUF, MICROALBUR, LDLCALC, CREATININE  -------------------------------------------------------------------------------------------------------------------- No results found for: WBC, HGB, HCT, PLT, GLUCOSE, CHOL, TRIG, HDL, LDLDIRECT, LDLCALC, ALT, AST, NA, K, CL, CREATININE, BUN, CO2, TSH, PSA, INR,  GLUF, HGBA1C, MICROALBUR  --------------------------------------------------------------------------------------------------------------------- Dg C-arm 1-60 Min-no Report  Result Date: 02/17/2017 Fluoroscopy was utilized by the requesting physician.  No radiographic interpretation.     Assessment & Plan:   Katherine Lester was seen today for back pain and leg pain.  Diagnoses and all orders for this visit:  Degenerative disc disease, lumbar  Chronic, continuous use of opioids  DDD (degenerative disc disease), lumbar -     triamcinolone acetonide (KENALOG-40) injection 40 mg; 1 mL (40 mg total) by Other route once. -     sodium chloride flush (NS) 0.9 % injection 10 mL; 10 mLs by Other route once. -     ropivacaine (PF) 2 mg/mL (0.2%) (NAROPIN) injection 10 mL; 10 mLs by Epidural route once. -     midazolam (VERSED) 5 MG/5ML injection 5 mg; Inject 5 mLs (5 mg total) into the vein once. -     lidocaine (PF) (XYLOCAINE) 1 % injection 5 mL; Inject 5 mLs into the skin once. -     lactated ringers infusion 1,000 mL; Inject 1,000 mLs into the vein continuous. -     iopamidol (ISOVUE-M) 41 % intrathecal injection 20 mL; 20 mLs by Other route once as needed for contrast.  Chronic bilateral low back pain with bilateral sciatica  Other orders -     Discontinue: HYDROcodone-acetaminophen (NORCO/VICODIN) 5-325 MG tablet; Take 1 tablet by mouth every 6 (six) hours as needed for moderate pain. -      HYDROcodone-acetaminophen (NORCO/VICODIN) 5-325 MG tablet; Take 1 tablet by mouth every 6 (six) hours as needed for moderate pain.        ----------------------------------------------------------------------------------------------------------------------  Problem List Items Addressed This Visit      Musculoskeletal and Integument   Degenerative disc disease, lumbar - Primary   Relevant Medications   HYDROcodone-acetaminophen (NORCO/VICODIN) 5-325 MG tablet   triamcinolone acetonide (KENALOG-40) injection 40 mg (Completed)    Other Visit Diagnoses    Chronic, continuous use of opioids       DDD (degenerative disc disease), lumbar       Relevant Medications   HYDROcodone-acetaminophen (NORCO/VICODIN) 5-325 MG tablet   triamcinolone acetonide (KENALOG-40) injection 40 mg (Completed)   sodium chloride flush (NS) 0.9 % injection 10 mL (Completed)   ropivacaine (PF) 2 mg/mL (0.2%) (NAROPIN) injection 10 mL (Completed)   midazolam (VERSED) 5 MG/5ML injection 5 mg (Completed)   lidocaine (PF) (XYLOCAINE) 1 % injection 5 mL (Completed)   lactated ringers infusion 1,000 mL   iopamidol (ISOVUE-M) 41 % intrathecal injection 20 mL   Chronic bilateral low back pain with bilateral sciatica       Relevant Medications   HYDROcodone-acetaminophen (NORCO/VICODIN) 5-325 MG tablet   triamcinolone acetonide (KENALOG-40) injection 40 mg (Completed)   midazolam (VERSED) 5 MG/5ML injection 5 mg (Completed)        ----------------------------------------------------------------------------------------------------------------------  1. Degenerative disc disease, lumbar We'll proceed with a epidural steroid injection today. The risks and benefits been reviewed with her in detail. Her findings are consistent with an L5 right greater than left sciatica that has been unremitting. She has responded to these favorably in the past. We'll have her return to clinic in 1 month for reevaluation possible repeat  injection at that time. I want her to continue with her back stretching strengthening exercises as tolerated  2. Chronic, continuous use of opioids I'm going to refill her Vicodin today for 60 tablets. We reviewed the [facility] practitioner database information and it is appropriate.  3. DDD (degenerative  disc disease), lumbar As above - triamcinolone acetonide (KENALOG-40) injection 40 mg; 1 mL (40 mg total) by Other route once. - sodium chloride flush (NS) 0.9 % injection 10 mL; 10 mLs by Other route once. - ropivacaine (PF) 2 mg/mL (0.2%) (NAROPIN) injection 10 mL; 10 mLs by Epidural route once. - midazolam (VERSED) 5 MG/5ML injection 5 mg; Inject 5 mLs (5 mg total) into the vein once. - lidocaine (PF) (XYLOCAINE) 1 % injection 5 mL; Inject 5 mLs into the skin once. - lactated ringers infusion 1,000 mL; Inject 1,000 mLs into the vein continuous. - iopamidol (ISOVUE-M) 41 % intrathecal injection 20 mL; 20 mLs by Other route once as needed for contrast.  4. Chronic bilateral low back pain with bilateral sciatica As above    ----------------------------------------------------------------------------------------------------------------------  I am having Katherine Lester maintain her Vitamin D3, zolpidem, cyclobenzaprine, DULoxetine, Vitamin D (Ergocalciferol), pantoprazole, cetirizine, fluticasone, polyethylene glycol, sucralfate, and HYDROcodone-acetaminophen. We administered triamcinolone acetonide, sodium chloride flush, ropivacaine (PF) 2 mg/mL (0.2%), midazolam, lidocaine (PF), and iopamidol. We will continue to administer bupivacaine (PF), fentaNYL, lactated ringers, midazolam, orphenadrine, triamcinolone acetonide, bupivacaine (PF), fentaNYL, lactated ringers, midazolam, orphenadrine, triamcinolone acetonide, bupivacaine (PF), orphenadrine, triamcinolone acetonide, lactated ringers, sodium chloride flush, triamcinolone acetonide, lactated ringers, bupivacaine (PF), orphenadrine,  triamcinolone acetonide, and sodium chloride flush.   Meds ordered this encounter  Medications  . DISCONTD: HYDROcodone-acetaminophen (NORCO/VICODIN) 5-325 MG tablet    Sig: Take 1 tablet by mouth every 6 (six) hours as needed for moderate pain.    Dispense:  60 tablet    Refill:  0  . HYDROcodone-acetaminophen (NORCO/VICODIN) 5-325 MG tablet    Sig: Take 1 tablet by mouth every 6 (six) hours as needed for moderate pain.    Dispense:  60 tablet    Refill:  0    Do not fill until [id number]  . triamcinolone acetonide (KENALOG-40) injection 40 mg  . sodium chloride flush (NS) 0.9 % injection 10 mL  . ropivacaine (PF) 2 mg/mL (0.2%) (NAROPIN) injection 10 mL  . midazolam (VERSED) 5 MG/5ML injection 5 mg  . lidocaine (PF) (XYLOCAINE) 1 % injection 5 mL  . lactated ringers infusion 1,000 mL  . iopamidol (ISOVUE-M) 41 % intrathecal injection 20 mL   Patient's Medications  New Prescriptions   No medications on file  Previous Medications   CETIRIZINE (ZYRTEC) 10 MG TABLET    Take 10 mg by mouth.   CHOLECALCIFEROL (VITAMIN D3) 10000 UNITS CAPSULE    Take 50,000 Units by mouth once a week.    CYCLOBENZAPRINE (FLEXERIL) 10 MG TABLET    Limit 1 tab by mouth 2-4 times a day if tolerated   DULOXETINE (CYMBALTA) 20 MG CAPSULE    Limit 1 capsule by mouth per day if tolerated   FLUTICASONE (FLONASE) 50 MCG/ACT NASAL SPRAY       PANTOPRAZOLE (PROTONIX) 40 MG TABLET    Take 40 mg by mouth daily.    POLYETHYLENE GLYCOL (MIRALAX / GLYCOLAX) PACKET       SUCRALFATE (CARAFATE) 1 G TABLET       VITAMIN D, ERGOCALCIFEROL, (DRISDOL) 50000 UNITS CAPS CAPSULE    Take by mouth.   ZOLPIDEM (AMBIEN) 5 MG TABLET    Take 5 mg by mouth at bedtime.  Modified Medications   Modified Medication Previous Medication   HYDROCODONE-ACETAMINOPHEN (NORCO/VICODIN) 5-325 MG TABLET HYDROcodone-acetaminophen (NORCO/VICODIN) 5-325 MG tablet      Take 1 tablet by mouth every 6 (six) hours as needed for moderate pain.  Take 1  tablet by mouth every 6 (six) hours as needed for moderate pain.  Discontinued Medications   No medications on file   ---------------------------------------------------------------------------------------------------------------------- Procedure: L5-S1 epidural steroid No. 1 fluoroscopic guidance with moderate sedation  Procedure: L5-S1 #1 LESI with fluoroscopic guidance and moderate sedation  NOTE: The risks, benefits, and expectations of the procedure have been discussed and explained to the patient who was understanding and in agreement with suggested treatment plan. No guarantees were made.  DESCRIPTION OF PROCEDURE: Lumbar epidural steroid injection with IV Versed, EKG, blood pressure, pulse, and pulse oximetry monitoring. The procedure was performed with the patient in the prone position under fluoroscopic guidance. I injected subcutaneous lidocaine overlying the L5-S1 to site after its fluoroscopic identifictation.  Using strict aseptic technique, I then advanced an 18-gauge Tuohy epidural needle in the midline using interlaminar approach via loss-of-resistance to saline technique. There was negative aspiration for heme or  CSF.  I then confirmed position with both AP and Lateral fluoroscan. 2 cc of Isovue were injected and a  total of 5 mL of Preservative-Free normal saline mixed with 40 mg of Kenalog and 1cc Ropicaine 0.2 percent were injected incrementally via the  epidurally placed needle. The needle was removed. The patient tolerated the injection well and was convalesced and discharged to home in stable condition. Should the patient have any post procedure difficulty they have been instructed on how to contact us for assistance.    Follow-up: Return in about 1 month (around 03/19/2017) for procedure.    Molli Barrows, MD

## 2017-02-18 NOTE — Telephone Encounter (Signed)
Post procedure phone call.  Patient states she is doing ok.  Patient questioned date of procedure.  States Dr Andree Elk wanted her to come back in 1 month for another procedure.  Juliann Pulse notified and call was transferred to [facility] to adjust appointment for next procedure per patient request.

## 2017-03-03 ENCOUNTER — Other Ambulatory Visit: Payer: Self-pay | Admitting: Family Medicine

## 2017-03-03 ENCOUNTER — Ambulatory Visit
Admission: RE | Admit: 2017-03-03 | Discharge: 2017-03-03 | Disposition: A | Discharge status: Home / Self Care | Payer: BC Managed Care – PPO | Source: Ambulatory Visit | Attending: Family Medicine | Admitting: Family Medicine

## 2017-03-03 DIAGNOSIS — R05 Cough: Secondary | ICD-10-CM

## 2017-03-03 DIAGNOSIS — R053 Chronic cough: Secondary | ICD-10-CM

## 2017-03-04 ENCOUNTER — Emergency Department
Admission: EM | Admit: 2017-03-04 | Discharge: 2017-03-04 | Disposition: A | Discharge status: Home / Self Care | Payer: BC Managed Care – PPO | Attending: Emergency Medicine | Admitting: Emergency Medicine

## 2017-03-04 ENCOUNTER — Encounter: Payer: Self-pay | Admitting: Emergency Medicine

## 2017-03-04 DIAGNOSIS — Z87891 Personal history of nicotine dependence: Secondary | ICD-10-CM | POA: Diagnosis not present

## 2017-03-04 DIAGNOSIS — Z79899 Other long term (current) drug therapy: Secondary | ICD-10-CM | POA: Diagnosis not present

## 2017-03-04 DIAGNOSIS — R531 Weakness: Secondary | ICD-10-CM | POA: Diagnosis present

## 2017-03-04 LAB — TSH: TSH: 0.59 u[IU]/mL (ref 0.350–4.500)

## 2017-03-04 LAB — BASIC METABOLIC PANEL
ANION GAP: 10 (ref 5–15)
BUN: 12 mg/dL (ref 6–20)
CALCIUM: 9.9 mg/dL (ref 8.9–10.3)
CO2: 26 mmol/L (ref 22–32)
Chloride: 103 mmol/L (ref 101–111)
Creatinine, Ser: 0.72 mg/dL (ref 0.44–1.00)
Glucose, Bld: 114 mg/dL — ABNORMAL HIGH (ref 65–99)
Potassium: 3.5 mmol/L (ref 3.5–5.1)
Sodium: 139 mmol/L (ref 135–145)

## 2017-03-04 LAB — CBC
HCT: 44.2 % (ref 35.0–47.0)
HEMOGLOBIN: 15.3 g/dL (ref 12.0–16.0)
MCH: 30.1 pg (ref 26.0–34.0)
MCHC: 34.7 g/dL (ref 32.0–36.0)
MCV: 86.9 fL (ref 80.0–100.0)
Platelets: 356 10*3/uL (ref 150–440)
RBC: 5.09 MIL/uL (ref 3.80–5.20)
RDW: 13.3 % (ref 11.5–14.5)
WBC: 12 10*3/uL — ABNORMAL HIGH (ref 3.6–11.0)

## 2017-03-04 LAB — URINALYSIS, COMPLETE (UACMP) WITH MICROSCOPIC
Bacteria, UA: NONE SEEN
Bilirubin Urine: NEGATIVE
GLUCOSE, UA: NEGATIVE mg/dL
HGB URINE DIPSTICK: NEGATIVE
KETONES UR: NEGATIVE mg/dL
LEUKOCYTES UA: NEGATIVE
NITRITE: NEGATIVE
PROTEIN: NEGATIVE mg/dL
Specific Gravity, Urine: 1.006 (ref 1.005–1.030)
pH: 5 (ref 5.0–8.0)

## 2017-03-04 LAB — INFLUENZA PANEL BY PCR (TYPE A & B)
INFLBPCR: NEGATIVE
Influenza A By PCR: NEGATIVE

## 2017-03-04 LAB — TROPONIN I: Troponin I: <0.03 ng/mL (ref <0.03)

## 2017-03-04 MED ORDER — SODIUM CHLORIDE 0.9 % IV BOLUS (SEPSIS)
1000.0000 mL | Freq: Once | INTRAVENOUS | Status: AC
  Administered 2017-03-04: 1000 mL via INTRAVENOUS

## 2017-03-04 NOTE — Discharge Instructions (Signed)
Your symptoms are likely caused by a viral infection or by bronchitis. You should stay well-hydrated, get plenty of rest, and follow up with her doctor next week. Return to the emergency department for any new or worsening fever, weakness, shortness of breath, chest pain, vomiting, or any other new or worsening symptoms that concern you.

## 2017-03-04 NOTE — ED Provider Notes (Signed)
[facility] Emergency Department Provider Note ____________________________________________   First MD Initiated Contact with Patient 03/04/17 1136     (approximate)  I have reviewed the triage vital signs and the nursing notes.   HISTORY  Chief Complaint Weakness    HPI Katherine Lester is a 61 y.o. female With a history of chronic back pain, anxiety, GERD, IBS, vertigo, who presents with generalized weakness for approximately the last few weeks, worse in the last week, associated with decreased appetite, lightheadedness, body aches, nausea, and feeling increasingly "emotional."  She states that she had symptoms of sinus infection was started on levofloxacin. She denies any other recent change in her medications. She denies any SI or HI. Patient's husband who lives with her denies any of the same symptoms.  A she was seen by her primary care doctor twice within the last week and the primary care doctor referred her to the emergency department.  Past Medical History:  Diagnosis Date  . Allergy   . Anxiety   . Chronic back pain   . Depression   . Generalized edema   . GERD (gastroesophageal reflux disease)   . IBS (irritable bowel syndrome)   . Insomnia   . Irritable bowel syndrome   . Positional vertigo   . Sinusitis   . Spastic colon     Patient Active Problem List   Diagnosis Date Noted  . Greater trochanteric bursitis 09/02/2015  . Degenerative disc disease, lumbar 11/13/2014  . Facet syndrome, lumbar 11/13/2014  . Sacroiliac joint dysfunction 11/13/2014    Past Surgical History:  Procedure Laterality Date  . ABDOMINAL HYSTERECTOMY    . APPENDECTOMY    . BREAST BIOPSY Right    stereo bx-neg  . DILATION AND CURETTAGE OF UTERUS    . FRACTURE SURGERY Left    arm    Prior to Admission medications   Medication Sig Start Date End Date Taking? Authorizing Provider  cetirizine (ZYRTEC) 10 MG tablet Take 10 mg by mouth.   Yes [provider]  Cholecalciferol (VITAMIN D3) 10000 UNITS capsule Take 50,000 Units by mouth once a week.    Yes [provider]  DULoxetine (CYMBALTA) 20 MG capsule Limit 1 capsule by mouth per day if tolerated 02/09/16  Yes Mohammed Kindle, MD  HYDROcodone-acetaminophen (NORCO/VICODIN) 5-325 MG tablet Take 1 tablet by mouth every 6 (six) hours as needed for moderate pain. 02/17/17  Yes Molli Barrows, MD  levofloxacin (LEVAQUIN) 500 MG tablet Take 1 tablet by mouth daily. 03/01/17  Yes [provider]  ondansetron (ZOFRAN-ODT) 4 MG disintegrating tablet Take 4 mg by mouth every 8 (eight) hours as needed for nausea or vomiting.   Yes [provider]  pantoprazole (PROTONIX) 40 MG tablet Take 40 mg by mouth daily.  09/29/16 09/29/17 Yes [provider]  polyethylene glycol (MIRALAX / GLYCOLAX) packet  09/06/16  Yes [provider]  zolpidem (AMBIEN) 5 MG tablet Take 5 mg by mouth at bedtime.   Yes [provider]  cyclobenzaprine (FLEXERIL) 10 MG tablet Limit 1 tab by mouth 2-4 times a day if tolerated 02/09/16   Mohammed Kindle, MD    Allergies Pseudoephedrine hcl; Citalopram; Gabapentin; Codeine; Erythromycin; Fentanyl; Morphine; and Tramadol  Family History  Problem Relation Age of Onset  . Cancer Mother   . COPD Mother   . Depression Mother   . Heart disease Mother   . Stroke Mother   . Drug abuse Brother   .  Alcohol abuse Brother   . Mental illness Brother   . Heart disease Maternal Grandmother   . Breast cancer Neg Hx     Social History Social History  Substance Use Topics  . Smoking status: Former Smoker    Years: 20.00    Quit date: 10/14/2013  . Smokeless tobacco: Never Used  . Alcohol use No    Review of Systems  Constitutional: Positive for chills. Eyes: No visual changes. ENT: No sore throat.  Negative for nasal congestion and rhinorrhea. Cardiovascular: Denies chest pain. Respiratory: Denies shortness of  breath. Gastrointestinal: Positive for nausea, no vomiting.  No diarrhea.  Genitourinary: Negative for dysuria.  Musculoskeletal: Positive for chronic back pain. Skin: Negative for rash. Neurological: Negative for headache.   ____________________________________________   PHYSICAL EXAM:  VITAL SIGNS: ED Triage Vitals [03/04/17 1004]  Enc Vitals Group     BP (!) 143/75     Pulse Rate 89     Resp 20     Temp 98.2 F (36.8 C)     Temp Source Oral     SpO2 (!) 10 %     Weight 168 lb (76.2 kg)     Height 5\' 6"  (1.676 m)     Head Circumference      Peak Flow      Pain Score      Pain Loc      Pain Edu?      Excl. in [place]?     Constitutional: Alert and oriented. Well appearing and in no acute distress. Eyes: Conjunctivae are normal. No pallor.  EOMI.  PERRLA.  Head: Atraumatic. Nose: No congestion/rhinnorhea. Mouth/Throat: Mucous membranes are moist.   Neck: Normal range of motion.  Cardiovascular: Normal rate, regular rhythm. Grossly normal heart sounds.  Good peripheral circulation. Respiratory: Normal respiratory effort.  No retractions. Lungs CTAB. Gastrointestinal: Soft and nontender. No distention.  Genitourinary: No CVA tenderness. Musculoskeletal: No lower extremity edema.  Extremities warm and well perfused.  Neurologic:  Normal speech and language. No gross focal neurologic deficits are appreciated.   Skin:  Skin is warm and dry. No rash noted. Psychiatric: Mood and affect are normal. Speech and behavior are normal.  ____________________________________________   LABS (all labs ordered are listed, but only abnormal results are displayed)  Labs Reviewed  BASIC METABOLIC PANEL - Abnormal; Notable for the following:       Result Value   Glucose, Bld 114 (*)    All other components within normal limits  CBC - Abnormal; Notable for the following:    WBC 12.0 (*)    All other components within normal limits  URINALYSIS, COMPLETE (UACMP) WITH MICROSCOPIC -  Abnormal; Notable for the following:    Color, Urine YELLOW (*)    APPearance CLEAR (*)    Squamous Epithelial / LPF 0-5 (*)    All other components within normal limits  TROPONIN I  TSH  INFLUENZA PANEL BY PCR (TYPE A & B)  CBG MONITORING, ED   ____________________________________________  EKG  ED ECG REPORT I, Arta Silence, the attending physician, personally viewed and interpreted this ECG.  Date: 03/04/2017 EKG Time: 1219 Rate: 76 Rhythm: normal sinus rhythm QRS Axis: normal Intervals: normal ST/T Wave abnormalities: normal Narrative Interpretation: no evidence of acute ischemia  ____________________________________________  RADIOLOGY    ____________________________________________   PROCEDURES  Procedure(s) performed: No    Critical Care performed: No ____________________________________________   INITIAL IMPRESSION / ASSESSMENT AND PLAN / ED COURSE  Pertinent labs &  imaging results that were available during my care of the patient were reviewed by me and considered in my medical decision making (see chart for details).  61 year old female with past medical history as noted presents with generalized weakness, lightheadedness, decreased appetite, nausea, and feeling increasingly emotional over the last week. Patient was sent in by PMD after 2 visits there. In the ED, vital signs are normal (O2 reading in triage was noted as 10% - this is likely spurious, will repeat), she is relatively well-appearing, and exam is unremarkable. He should already had a chest x-ray performed yesterday, as well as a UA and was sent for some blood tests including a vitamin D level.  Differential includes flu, other viral syndrome, UTI, anemia, dehydration, less likely cardiac cause, thyroid or other endocrine, or autoimmune.  Do not susp CO poisoning (given husband has no sx), pna (given neg CXR), or respiratory cause.  Plan: labs incl TSH, flu, UA and troponin, fluids, and  reassess.  Pt denies SI/HI, is not an acute danger to self or others and does not require emergent psych eval in ED.     ----------------------------------------- 2:00 PM on 03/04/2017 -----------------------------------------  Patient's ED workup is negative except for very slightly elevated white blood cell count. She does report some active cough, but the chest x-ray done as an outpatient yesterday was negative. Overall I suspect most likely a viral syndrome versus bronchitis, for which she is already being adequately treated. Patient feels well after the fluids, and her vital signs are stable. She feels well to go home. I gave patient thorough return precautions; I explained that we are seeing her one particular point in time but if her symptoms worsen before she can follow up with her primary care doctor she should return to the emergency department.  ____________________________________________   FINAL CLINICAL IMPRESSION(S) / ED DIAGNOSES  Final diagnoses:  Generalized weakness      NEW MEDICATIONS STARTED DURING THIS VISIT:  New Prescriptions   No medications on file     Note:  This document was prepared using Dragon voice recognition software and may include unintentional dictation errors.    Arta Silence, MD 03/04/17 [id number]

## 2017-03-04 NOTE — ED Triage Notes (Signed)
Pt reports that she "sick", she has weakness, no apatite, and emotional. She has been to her MD twice and they have ran test, they arent finding anything, so she sent her here to be evaluated.

## 2017-03-04 NOTE — ED Notes (Signed)
Pt placed in gown and placed on monitor. EKG performed. UA requested.

## 2017-03-04 NOTE — ED Triage Notes (Signed)
First Nurse Note:  Arrives with c/o general sickness, body aches x 1 week.  Has been seen by PCP, who sent patient into ED today for evaluation.  Patient is AAOx3.  Skin warm and dry. NAD.

## 2017-03-16 ENCOUNTER — Other Ambulatory Visit: Payer: Self-pay | Admitting: Family Medicine

## 2017-03-16 DIAGNOSIS — J329 Chronic sinusitis, unspecified: Secondary | ICD-10-CM

## 2017-03-23 ENCOUNTER — Ambulatory Visit
Admission: RE | Admit: 2017-03-23 | Discharge: 2017-03-23 | Disposition: A | Discharge status: Home / Self Care | Payer: BC Managed Care – PPO | Source: Ambulatory Visit | Attending: Family Medicine | Admitting: Family Medicine

## 2017-03-23 DIAGNOSIS — J329 Chronic sinusitis, unspecified: Secondary | ICD-10-CM

## 2017-04-28 ENCOUNTER — Ambulatory Visit (HOSPITAL_BASED_OUTPATIENT_CLINIC_OR_DEPARTMENT_OTHER): Payer: BC Managed Care – PPO | Admitting: Anesthesiology

## 2017-04-28 ENCOUNTER — Ambulatory Visit: Payer: BC Managed Care – PPO | Admitting: Anesthesiology

## 2017-04-28 ENCOUNTER — Ambulatory Visit
Admission: RE | Admit: 2017-04-28 | Discharge: 2017-04-28 | Disposition: A | Discharge status: Home / Self Care | Payer: BC Managed Care – PPO | Source: Ambulatory Visit | Attending: Anesthesiology | Admitting: Anesthesiology

## 2017-04-28 ENCOUNTER — Encounter: Payer: Self-pay | Admitting: Anesthesiology

## 2017-04-28 ENCOUNTER — Other Ambulatory Visit: Payer: Self-pay | Admitting: Anesthesiology

## 2017-04-28 ENCOUNTER — Other Ambulatory Visit: Payer: Self-pay

## 2017-04-28 VITALS — BP 124/82 | HR 92 | Temp 98.7°F | Resp 18 | Ht 66.0 in | Wt 175.0 lb

## 2017-04-28 DIAGNOSIS — M5136 Other intervertebral disc degeneration, lumbar region: Secondary | ICD-10-CM

## 2017-04-28 DIAGNOSIS — Z79891 Long term (current) use of opiate analgesic: Secondary | ICD-10-CM | POA: Insufficient documentation

## 2017-04-28 DIAGNOSIS — M47816 Spondylosis without myelopathy or radiculopathy, lumbar region: Secondary | ICD-10-CM

## 2017-04-28 DIAGNOSIS — M533 Sacrococcygeal disorders, not elsewhere classified: Secondary | ICD-10-CM | POA: Insufficient documentation

## 2017-04-28 DIAGNOSIS — M7060 Trochanteric bursitis, unspecified hip: Secondary | ICD-10-CM

## 2017-04-28 DIAGNOSIS — G8929 Other chronic pain: Secondary | ICD-10-CM | POA: Diagnosis not present

## 2017-04-28 DIAGNOSIS — Z79899 Other long term (current) drug therapy: Secondary | ICD-10-CM | POA: Insufficient documentation

## 2017-04-28 DIAGNOSIS — M545 Low back pain: Secondary | ICD-10-CM | POA: Diagnosis present

## 2017-04-28 DIAGNOSIS — R52 Pain, unspecified: Secondary | ICD-10-CM

## 2017-04-28 DIAGNOSIS — M5441 Lumbago with sciatica, right side: Secondary | ICD-10-CM

## 2017-04-28 DIAGNOSIS — M488X6 Other specified spondylopathies, lumbar region: Secondary | ICD-10-CM | POA: Diagnosis not present

## 2017-04-28 DIAGNOSIS — M5442 Lumbago with sciatica, left side: Secondary | ICD-10-CM

## 2017-04-28 DIAGNOSIS — F1190 Opioid use, unspecified, uncomplicated: Secondary | ICD-10-CM

## 2017-04-28 MED ORDER — ROPIVACAINE HCL 2 MG/ML IJ SOLN
INTRAMUSCULAR | Status: AC
Start: 2017-04-28 — End: ?
  Filled 2017-04-28: qty 10

## 2017-04-28 MED ORDER — LIDOCAINE HCL (PF) 1 % IJ SOLN
5.0000 mL | Freq: Once | INTRAMUSCULAR | Status: AC
  Administered 2017-04-28: 5 mL via SUBCUTANEOUS

## 2017-04-28 MED ORDER — TRIAMCINOLONE ACETONIDE 40 MG/ML IJ SUSP
INTRAMUSCULAR | Status: AC
  Filled 2017-04-28: qty 1

## 2017-04-28 MED ORDER — LIDOCAINE HCL (PF) 1 % IJ SOLN
INTRAMUSCULAR | Status: AC
  Filled 2017-04-28: qty 5

## 2017-04-28 MED ORDER — IOPAMIDOL (ISOVUE-M 200) INJECTION 41%
20.0000 mL | Freq: Once | INTRAMUSCULAR | Status: DC | PRN
  Administered 2017-04-28: 10 mL
  Filled 2017-04-28: qty 20

## 2017-04-28 MED ORDER — ROPIVACAINE HCL 2 MG/ML IJ SOLN
10.0000 mL | Freq: Once | INTRAMUSCULAR | Status: AC
Start: 2017-04-28 — End: 2017-04-28
  Administered 2017-04-28: 1 mL via EPIDURAL

## 2017-04-28 MED ORDER — MIDAZOLAM HCL 5 MG/5ML IJ SOLN
INTRAMUSCULAR | Status: AC
  Filled 2017-04-28: qty 5

## 2017-04-28 MED ORDER — LACTATED RINGERS IV SOLN
1000.0000 mL | INTRAVENOUS | Status: DC
  Administered 2017-04-28: 1000 mL via INTRAVENOUS

## 2017-04-28 MED ORDER — SODIUM CHLORIDE 0.9% FLUSH
10.0000 mL | Freq: Once | INTRAVENOUS | Status: AC
  Administered 2017-04-28: 4 mL

## 2017-04-28 MED ORDER — SODIUM CHLORIDE 0.9 % IJ SOLN
INTRAMUSCULAR | Status: AC
  Filled 2017-04-28: qty 10

## 2017-04-28 MED ORDER — TRIAMCINOLONE ACETONIDE 40 MG/ML IJ SUSP
40.0000 mg | Freq: Once | INTRAMUSCULAR | Status: AC
  Administered 2017-04-28: 40 mg

## 2017-04-28 MED ORDER — MIDAZOLAM HCL 2 MG/2ML IJ SOLN
5.0000 mg | Freq: Once | INTRAMUSCULAR | Status: AC
  Administered 2017-04-28: 4 mg via INTRAVENOUS

## 2017-04-28 MED ORDER — HYDROCODONE-ACETAMINOPHEN 5-325 MG PO TABS: 1.0000 | ORAL_TABLET | Freq: Two times a day (BID) | ORAL | 0 refills | Status: DC

## 2017-04-28 MED ORDER — IOPAMIDOL (ISOVUE-M 200) INJECTION 41%
INTRAMUSCULAR | Status: AC
  Filled 2017-04-28: qty 10

## 2017-04-28 NOTE — Progress Notes (Signed)
Nursing Pain Medication Assessment:  Safety precautions to be maintained throughout the outpatient stay will include: orient to surroundings, keep bed in low position, maintain call bell within reach at all times, provide assistance with transfer out of bed and ambulation.  Medication Inspection Compliance: Katherine Lester did not comply with our request to bring her pills to be counted. She was reminded that bringing the medication bottles, even when empty, is a requirement.  Medication: None brought in. Pill/Patch Count: None available to be counted. Bottle Appearance: No container available. Did not bring bottle(s) to appointment. Filled Date: N/A Last Medication intake:  Yesterday 

## 2017-04-28 NOTE — Patient Instructions (Signed)
Pain Management Discharge Instructions  General Discharge Instructions :  If you need to reach your doctor call: Monday-Friday 8:00 am - 4:00 pm at [phone] or toll free [phone].  After clinic hours [phone] to have operator reach doctor.  Bring all of your medication bottles to all your appointments in the pain clinic.  To cancel or reschedule your appointment with Pain Management please remember to call 24 hours in advance to avoid a fee.  Refer to the educational materials which you have been given on: General Risks, I had my Procedure. Discharge Instructions, Post Sedation.  Post Procedure Instructions:  The drugs you were given will stay in your system until tomorrow, so for the next 24 hours you should not drive, make any legal decisions or drink any alcoholic beverages.  You may eat anything you prefer, but it is better to start with liquids then soups and crackers, and gradually work up to solid foods.  Please notify your doctor immediately if you have any unusual bleeding, trouble breathing or pain that is not related to your normal pain.  Depending on the type of procedure that was done, some parts of your body may feel week and/or numb.  This usually clears up by tonight or the next day.  Walk with the use of an assistive device or accompanied by an adult for the 24 hours.  You may use ice on the affected area for the first 24 hours.  Put ice in a Ziploc bag and cover with a towel and place against area 15 minutes on 15 minutes off.  You may switch to heat after 24 hours.Epidural Steroid Injection Patient Information  Description: The epidural space surrounds the nerves as they exit the spinal cord.  In some patients, the nerves can be compressed and inflamed by a bulging disc or a tight spinal canal (spinal stenosis).  By injecting steroids into the epidural space, we can bring irritated nerves into direct contact with a potentially helpful medication.  These  steroids act directly on the irritated nerves and can reduce swelling and inflammation which often leads to decreased pain.  Epidural steroids may be injected anywhere along the spine and from the neck to the low back depending upon the location of your pain.   After numbing the skin with local anesthetic (like Novocaine), a small needle is passed into the epidural space slowly.  You may experience a sensation of pressure while this is being done.  The entire block usually last less than 10 minutes.  Conditions which may be treated by epidural steroids:   Low back and leg pain  Neck and arm pain  Spinal stenosis  Post-laminectomy syndrome  Herpes zoster (shingles) pain  Pain from compression fractures  Preparation for the injection:  1. Do not eat any solid food or dairy products within 8 hours of your appointment.  2. You may drink clear liquids up to 3 hours before appointment.  Clear liquids include water, black coffee, juice or soda.  No milk or cream please. 3. You may take your regular medication, including pain medications, with a sip of water before your appointment  Diabetics should hold regular insulin (if taken separately) and take 1/2 normal NPH dos the morning of the procedure.  Carry some sugar containing items with you to your appointment. 4. A driver must accompany you and be prepared to drive you home after your procedure.  5. Bring all your current medications with your. 6. An IV may be inserted and   sedation may be given at the discretion of the physician.   7. A blood pressure cuff, EKG and other monitors will often be applied during the procedure.  Some patients may need to have extra oxygen administered for a short period. 8. You will be asked to provide medical information, including your allergies, prior to the procedure.  We must know immediately if you are taking blood thinners (like Coumadin/Warfarin)  Or if you are allergic to IV iodine contrast (dye). We must  know if you could possible be pregnant.  Possible side-effects:  Bleeding from needle site  Infection (rare, may require surgery)  Nerve injury (rare)  Numbness & tingling (temporary)  Difficulty urinating (rare, temporary)  Spinal headache ( a headache worse with upright posture)  Light -headedness (temporary)  Pain at injection site (several days)  Decreased blood pressure (temporary)  Weakness in arm/leg (temporary)  Pressure sensation in back/neck (temporary)  Call if you experience:  Fever/chills associated with headache or increased back/neck pain.  Headache worsened by an upright position.  New onset weakness or numbness of an extremity below the injection site  Hives or difficulty breathing (go to the emergency room)  Inflammation or drainage at the infection site  Severe back/neck pain  Any new symptoms which are concerning to you  Please note:  Although the local anesthetic injected can often make your back or neck feel good for several hours after the injection, the pain will likely return.  It takes 3-7 days for steroids to work in the epidural space.  You may not notice any pain relief for at least that one week.  If effective, we will often do a series of three injections spaced 3-6 weeks apart to maximally decrease your pain.  After the initial series, we generally will wait several months before considering a repeat injection of the same type.  If you have any questions, please call [phone]  [facility] 

## 2017-04-29 ENCOUNTER — Telehealth: Payer: Self-pay

## 2017-04-29 NOTE — Telephone Encounter (Signed)
Post procedure phone call. Patient states she is doing good.  

## 2017-04-29 NOTE — Progress Notes (Signed)
Subjective:  Patient ID: Katherine Lester, female    DOB: 01-Apr-1956  Age: 61 y.o. MRN: [id number]  CC: Back Pain (low) and Leg Pain (bilateral)   Procedure: L5-S1 epidural steroid under fluoroscopic guidance with moderate sedation  HPI Katherine Lester presents for reevaluation today.  She was last seen back in September at which point she had an L5-S1 epidural steroid injection.  She states that she responded very favorably to this.  She had about 60-70% reduction in her low back pain lasting about 10 weeks.  Furthermore she had significant reduction in her bilateral lower extremity pain lasting 10 weeks as well.  She had gradual recurrence of this pain same quality same characteristic as before.  No change in bowel or bladder function is noted at this time.  Back in May of the year she had a series of bilateral facet injections with help with the back pain however at this point the lower extremity pain has become more problematic.  She feels like she is making good progress with the epidural injections and would like to undergo a second injection today.  Otherwise she is in her usual state of health.  She is tolerating her medications well and based on her narcotic assessment sheet she continues to derive good functional lifestyle improvement with her medicines.  She uses these approximately twice a day to keep her pain under control.  This pain is been quite recalcitrant and these medicines seem to be working well with minimal side effects.  Outpatient Medications Prior to Visit  Medication Sig Dispense Refill  . cetirizine (ZYRTEC) 10 MG tablet Take 10 mg by mouth.    . Cholecalciferol (VITAMIN D3) 10000 UNITS capsule Take 50,000 Units by mouth once a week.     . cyclobenzaprine (FLEXERIL) 10 MG tablet Limit 1 tab by mouth 2-4 times a day if tolerated 100 tablet 0  . DULoxetine (CYMBALTA) 20 MG capsule Limit 1 capsule by mouth per day if tolerated 30 capsule 0  . ondansetron (ZOFRAN-ODT)  4 MG disintegrating tablet Take 4 mg by mouth every 8 (eight) hours as needed for nausea or vomiting.    . pantoprazole (PROTONIX) 40 MG tablet Take 40 mg by mouth daily.     . polyethylene glycol (MIRALAX / GLYCOLAX) packet     . zolpidem (AMBIEN) 5 MG tablet Take 5 mg by mouth at bedtime.    Marland Kitchen HYDROcodone-acetaminophen (NORCO/VICODIN) 5-325 MG tablet Take 1 tablet by mouth every 6 (six) hours as needed for moderate pain. 60 tablet 0  . levofloxacin (LEVAQUIN) 500 MG tablet Take 1 tablet by mouth daily.     Facility-Administered Medications Prior to Visit  Medication Dose Route Frequency Provider Last Rate Last Dose  . bupivacaine (PF) (MARCAINE) 0.25 % injection 30 mL  30 mL Other Once Mohammed Kindle, MD      . bupivacaine (PF) (MARCAINE) 0.25 % injection 30 mL  30 mL Other Once Mohammed Kindle, MD      . bupivacaine (PF) (MARCAINE) 0.25 % injection 30 mL  30 mL Other Once Mohammed Kindle, MD      . bupivacaine (PF) (MARCAINE) 0.25 % injection 30 mL  30 mL Other Once Mohammed Kindle, MD      . fentaNYL (SUBLIMAZE) injection 100 mcg  100 mcg Intravenous Once Mohammed Kindle, MD      . fentaNYL (SUBLIMAZE) injection 100 mcg  100 mcg Intravenous Once Mohammed Kindle, MD      . lactated ringers infusion  1,000 mL  1,000 mL Intravenous Continuous Mohammed Kindle, MD      . lactated ringers infusion 1,000 mL  1,000 mL Intravenous Continuous Mohammed Kindle, MD      . lactated ringers infusion 1,000 mL  1,000 mL Intravenous Continuous Mohammed Kindle, MD 125 mL/hr at 10/15/15 0923 1,000 mL at 10/15/15 0923  . lactated ringers infusion 1,000 mL  1,000 mL Intravenous Continuous Mohammed Kindle, MD 125 mL/hr at 01/12/16 0950 1,000 mL at 01/12/16 0950  . midazolam (VERSED) 5 MG/5ML injection 5 mg  5 mg Intravenous Once Mohammed Kindle, MD      . midazolam (VERSED) 5 MG/5ML injection 5 mg  5 mg Intravenous Once Mohammed Kindle, MD      . orphenadrine (NORFLEX) injection 60 mg  60 mg Intramuscular Once Mohammed Kindle,  MD      . orphenadrine (NORFLEX) injection 60 mg  60 mg Intramuscular Once Mohammed Kindle, MD      . orphenadrine (NORFLEX) injection 60 mg  60 mg Intramuscular Once Mohammed Kindle, MD      . orphenadrine (NORFLEX) injection 60 mg  60 mg Intramuscular Once Mohammed Kindle, MD      . sodium chloride flush (NS) 0.9 % injection 20 mL  20 mL Other Once Mohammed Kindle, MD      . sodium chloride flush (NS) 0.9 % injection 20 mL  20 mL Other Once Mohammed Kindle, MD      . triamcinolone acetonide (KENALOG-40) injection 40 mg  40 mg Other Once Mohammed Kindle, MD      . triamcinolone acetonide (KENALOG-40) injection 40 mg  40 mg Other Once Mohammed Kindle, MD      . triamcinolone acetonide (KENALOG-40) injection 40 mg  40 mg Other Once Mohammed Kindle, MD      . triamcinolone acetonide (KENALOG-40) injection 40 mg  40 mg Other Once Mohammed Kindle, MD      . triamcinolone acetonide (KENALOG-40) injection 40 mg  40 mg Other Once Mohammed Kindle, MD        Review of Systems CNS: No sedation or confusion Cardiac: No angina or palpitations GI: No constipation or abdominal pain  Objective:  BP 124/82   Pulse 92   Temp 98.7 F (37.1 C)   Resp 18   Ht 5\' 6"  (1.676 m)   Wt 175 lb (79.4 kg)   SpO2 100%   BMI 28.25 kg/m    BP Readings from Last 3 Encounters:  04/28/17 124/82  03/04/17 122/78  02/17/17 122/81     Wt Readings from Last 3 Encounters:  04/28/17 175 lb (79.4 kg)  03/04/17 168 lb (76.2 kg)  02/17/17 178 lb (80.7 kg)     Physical Exam Pt is alert and oriented PERRL EOMI HEART IS RRR no murmur or rub LCTA no wheezing or rhales MUSCULOSKELETAL reveals persistent paraspinous muscle tenderness but no overt trigger points.  She does have a straight leg raise present on the right greater than left and her muscle tone and bulk to the lower extremities is intact at baseline.  Labs  No results found for: HGBA1C Lab Results  Component Value Date   CREATININE 0.72 03/04/2017     -------------------------------------------------------------------------------------------------------------------- Lab Results  Component Value Date   WBC 12.0 (H) 03/04/2017   HGB 15.3 03/04/2017   HCT 44.2 03/04/2017   PLT 356 03/04/2017   GLUCOSE 114 (H) 03/04/2017   NA 139 03/04/2017   K 3.5 03/04/2017   CL 103 03/04/2017   CREATININE 0.72  03/04/2017   BUN 12 03/04/2017   CO2 26 03/04/2017   TSH 0.590 03/04/2017    --------------------------------------------------------------------------------------------------------------------- Dg C-arm 1-60 Min-no Report  Result Date: 04/28/2017 Fluoroscopy was utilized by the requesting physician.  No radiographic interpretation.     Assessment & Plan:   Katherine Lester was seen today for back pain and leg pain.  Diagnoses and all orders for this visit:  Degenerative disc disease, lumbar  Chronic, continuous use of opioids -     ToxASSURE Select 13 (MW), Urine  DDD (degenerative disc disease), lumbar  Chronic bilateral low back pain with bilateral sciatica -     Lumbar Epidural Injection; Future -     triamcinolone acetonide (KENALOG-40) injection 40 mg -     sodium chloride flush (NS) 0.9 % injection 10 mL -     ropivacaine (PF) 2 mg/mL (0.2%) (NAROPIN) injection 10 mL -     midazolam (VERSED) injection 5 mg -     lidocaine (PF) (XYLOCAINE) 1 % injection 5 mL -     lactated ringers infusion 1,000 mL -     iopamidol (ISOVUE-M) 41 % intrathecal injection 20 mL  Facet syndrome, lumbar  Greater trochanteric bursitis, unspecified laterality  Sacroiliac joint dysfunction  Other orders -     HYDROcodone-acetaminophen (NORCO/VICODIN) 5-325 MG tablet; Take 1 tablet by mouth 2 (two) times daily.        ----------------------------------------------------------------------------------------------------------------------  Problem List Items Addressed This Visit      Unprioritized   Degenerative disc disease, lumbar -  Primary   Relevant Medications   HYDROcodone-acetaminophen (NORCO/VICODIN) 5-325 MG tablet   triamcinolone acetonide (KENALOG-40) injection 40 mg (Completed)   Facet syndrome, lumbar   Relevant Medications   HYDROcodone-acetaminophen (NORCO/VICODIN) 5-325 MG tablet   triamcinolone acetonide (KENALOG-40) injection 40 mg (Completed)   Greater trochanteric bursitis   Sacroiliac joint dysfunction    Other Visit Diagnoses    Chronic, continuous use of opioids       Relevant Orders   ToxASSURE Select 13 (MW), Urine   DDD (degenerative disc disease), lumbar       Relevant Medications   HYDROcodone-acetaminophen (NORCO/VICODIN) 5-325 MG tablet   triamcinolone acetonide (KENALOG-40) injection 40 mg (Completed)   Chronic bilateral low back pain with bilateral sciatica       Relevant Medications   HYDROcodone-acetaminophen (NORCO/VICODIN) 5-325 MG tablet   triamcinolone acetonide (KENALOG-40) injection 40 mg (Completed)   sodium chloride flush (NS) 0.9 % injection 10 mL (Completed)   ropivacaine (PF) 2 mg/mL (0.2%) (NAROPIN) injection 10 mL (Completed)   midazolam (VERSED) injection 5 mg (Completed)   lidocaine (PF) (XYLOCAINE) 1 % injection 5 mL (Completed)   lactated ringers infusion 1,000 mL   iopamidol (ISOVUE-M) 41 % intrathecal injection 20 mL   Other Relevant Orders   Lumbar Epidural Injection        ----------------------------------------------------------------------------------------------------------------------  1. Degenerative disc disease, lumbar I will have her continue with core stretching strengthening exercises as reviewed once again today.  She is to return to clinic in approximately 1-2 months for reevaluation and possible repeat injection at that time  2. Chronic, continuous use of opioids We will refill her medications today for November 29 and I want her to continue with her Flexeril and hydrocodone.  We reviewed the [place] practitioner database information  and it is appropriate.  Her previous urine drug screen in February was negative and she is due for a recheck - ToxASSURE Select 13 (MW), Urine  3. DDD (degenerative disc disease),  lumbar As above  4. Chronic bilateral low back pain with bilateral sciatica We will proceed with an L5-S1 epidural today with the risks and benefits once again reviewed and all questions answered. - Lumbar Epidural Injection; Future - triamcinolone acetonide (KENALOG-40) injection 40 mg - sodium chloride flush (NS) 0.9 % injection 10 mL - ropivacaine (PF) 2 mg/mL (0.2%) (NAROPIN) injection 10 mL - midazolam (VERSED) injection 5 mg - lidocaine (PF) (XYLOCAINE) 1 % injection 5 mL - lactated ringers infusion 1,000 mL - iopamidol (ISOVUE-M) 41 % intrathecal injection 20 mL  5. Facet syndrome, lumbar   6. Greater trochanteric bursitis, unspecified laterality   7. Sacroiliac joint dysfunction     ----------------------------------------------------------------------------------------------------------------------  I have changed Katherine Lester's HYDROcodone-acetaminophen. I am also having her maintain her Vitamin D3, zolpidem, cyclobenzaprine, DULoxetine, pantoprazole, cetirizine, polyethylene glycol, levofloxacin, and ondansetron. We administered triamcinolone acetonide, sodium chloride flush, ropivacaine (PF) 2 mg/mL (0.2%), midazolam, lidocaine (PF), lactated ringers, and iopamidol. We will continue to administer bupivacaine (PF), fentaNYL, lactated ringers, midazolam, orphenadrine, triamcinolone acetonide, bupivacaine (PF), fentaNYL, lactated ringers, midazolam, orphenadrine, triamcinolone acetonide, bupivacaine (PF), orphenadrine, triamcinolone acetonide, lactated ringers, sodium chloride flush, triamcinolone acetonide, lactated ringers, bupivacaine (PF), orphenadrine, triamcinolone acetonide, and sodium chloride flush.   Meds ordered this encounter  Medications  . HYDROcodone-acetaminophen  (NORCO/VICODIN) 5-325 MG tablet    Sig: Take 1 tablet by mouth 2 (two) times daily.    Dispense:  60 tablet    Refill:  0  . triamcinolone acetonide (KENALOG-40) injection 40 mg  . sodium chloride flush (NS) 0.9 % injection 10 mL  . ropivacaine (PF) 2 mg/mL (0.2%) (NAROPIN) injection 10 mL  . midazolam (VERSED) injection 5 mg  . lidocaine (PF) (XYLOCAINE) 1 % injection 5 mL  . lactated ringers infusion 1,000 mL  . iopamidol (ISOVUE-M) 41 % intrathecal injection 20 mL     Medication List        Accurate as of 04/28/17 11:59 PM. Always use your most recent med list.          cetirizine 10 MG tablet Commonly known as:  ZYRTEC   cyclobenzaprine 10 MG tablet Commonly known as:  FLEXERIL Limit 1 tab by mouth 2-4 times a day if tolerated   DULoxetine 20 MG capsule Commonly known as:  CYMBALTA Limit 1 capsule by mouth per day if tolerated   HYDROcodone-acetaminophen 5-325 MG tablet Commonly known as:  NORCO/VICODIN Take 1 tablet by mouth 2 (two) times daily.   levofloxacin 500 MG tablet Commonly known as:  LEVAQUIN   ondansetron 4 MG disintegrating tablet Commonly known as:  ZOFRAN-ODT   pantoprazole 40 MG tablet Commonly known as:  PROTONIX   polyethylene glycol packet Commonly known as:  MIRALAX / GLYCOLAX   Vitamin D3 10000 units capsule   zolpidem 5 MG tablet Commonly known as:  AMBIEN       Where to Get Your Medications    You can get these medications from any pharmacy   Bring a paper prescription for each of these medications  HYDROcodone-acetaminophen 5-325 MG tablet    ---------------------------------------------------------------------------------------------------------------------- Procedure: L5-S1 epidural steroid No. 2 under fluoroscopic guidance with moderate sedation   Procedure: L5-S1 LESI with fluoroscopic guidance and moderate sedation  NOTE: The risks, benefits, and expectations of the procedure have been discussed and explained to the  patient who was understanding and in agreement with suggested treatment plan. No guarantees were made.  DESCRIPTION OF PROCEDURE: Lumbar epidural steroid injection with IV Versed, EKG, blood pressure, pulse,  and pulse oximetry monitoring. The procedure was performed with the patient in the prone position under fluoroscopic guidance. I injected subcutaneous lidocaine overlying the L5-S1 site after its fluoroscopic identifictation.  Using strict aseptic technique, I then advanced an 18-gauge Tuohy epidural needle in the midline using interlaminar approach via loss-of-resistance to saline technique. There was negative aspiration for heme or  CSF.  I then confirmed position with both AP and Lateral fluoroscan.  2 cc of Isovue were injected and a  total of 5 mL of Preservative-Free normal saline mixed with 40 mg of Kenalog and 1cc Ropicaine 0.2 percent were injected incrementally via the  epidurally placed needle. The needle was removed. The patient tolerated the injection well and was convalesced and discharged to home in stable condition. Should the patient have any post procedure difficulty they have been instructed on how to contact us for assistance.   Follow-up: Return in about 2 months (around 06/28/2017) for procedure.    Molli Barrows, MD

## 2017-06-30 ENCOUNTER — Ambulatory Visit (HOSPITAL_BASED_OUTPATIENT_CLINIC_OR_DEPARTMENT_OTHER): Payer: BC Managed Care – PPO | Admitting: Anesthesiology

## 2017-06-30 ENCOUNTER — Ambulatory Visit
Admission: RE | Admit: 2017-06-30 | Discharge: 2017-06-30 | Disposition: A | Discharge status: Home / Self Care | Payer: BC Managed Care – PPO | Source: Ambulatory Visit | Attending: Anesthesiology | Admitting: Anesthesiology

## 2017-06-30 ENCOUNTER — Other Ambulatory Visit: Payer: Self-pay

## 2017-06-30 ENCOUNTER — Encounter: Payer: Self-pay | Admitting: Anesthesiology

## 2017-06-30 ENCOUNTER — Other Ambulatory Visit: Payer: Self-pay | Admitting: Anesthesiology

## 2017-06-30 VITALS — BP 156/84 | HR 85 | Temp 98.4°F | Resp 13 | Ht 66.0 in | Wt 178.0 lb

## 2017-06-30 DIAGNOSIS — M5441 Lumbago with sciatica, right side: Secondary | ICD-10-CM | POA: Diagnosis not present

## 2017-06-30 DIAGNOSIS — R52 Pain, unspecified: Secondary | ICD-10-CM

## 2017-06-30 DIAGNOSIS — M5442 Lumbago with sciatica, left side: Secondary | ICD-10-CM

## 2017-06-30 DIAGNOSIS — Z79899 Other long term (current) drug therapy: Secondary | ICD-10-CM | POA: Insufficient documentation

## 2017-06-30 DIAGNOSIS — M5136 Other intervertebral disc degeneration, lumbar region: Secondary | ICD-10-CM

## 2017-06-30 DIAGNOSIS — G8929 Other chronic pain: Secondary | ICD-10-CM

## 2017-06-30 DIAGNOSIS — Z79891 Long term (current) use of opiate analgesic: Secondary | ICD-10-CM | POA: Diagnosis not present

## 2017-06-30 DIAGNOSIS — M545 Low back pain: Secondary | ICD-10-CM | POA: Diagnosis present

## 2017-06-30 DIAGNOSIS — F1190 Opioid use, unspecified, uncomplicated: Secondary | ICD-10-CM

## 2017-06-30 DIAGNOSIS — M47816 Spondylosis without myelopathy or radiculopathy, lumbar region: Secondary | ICD-10-CM

## 2017-06-30 MED ORDER — MIDAZOLAM HCL 5 MG/5ML IJ SOLN
INTRAMUSCULAR | Status: AC
Start: 2017-06-30 — End: 2017-06-30
  Filled 2017-06-30: qty 5

## 2017-06-30 MED ORDER — HYDROCODONE-ACETAMINOPHEN 5-325 MG PO TABS: 1.0000 | ORAL_TABLET | Freq: Two times a day (BID) | ORAL | 0 refills | Status: DC

## 2017-06-30 MED ORDER — SODIUM CHLORIDE 0.9% FLUSH
10.0000 mL | Freq: Once | INTRAVENOUS | Status: AC
  Administered 2017-06-30: 10 mL

## 2017-06-30 MED ORDER — SODIUM CHLORIDE 0.9 % IJ SOLN
INTRAMUSCULAR | Status: AC
  Filled 2017-06-30: qty 10

## 2017-06-30 MED ORDER — LIDOCAINE HCL (PF) 1 % IJ SOLN
INTRAMUSCULAR | Status: AC
Start: 2017-06-30 — End: 2017-06-30
  Filled 2017-06-30: qty 5

## 2017-06-30 MED ORDER — ROPIVACAINE HCL 2 MG/ML IJ SOLN
10.0000 mL | Freq: Once | INTRAMUSCULAR | Status: AC
  Administered 2017-06-30: 10 mL via EPIDURAL

## 2017-06-30 MED ORDER — IOPAMIDOL (ISOVUE-M 200) INJECTION 41%
20.0000 mL | Freq: Once | INTRAMUSCULAR | Status: DC | PRN
  Administered 2017-06-30: 10 mL
  Filled 2017-06-30: qty 20

## 2017-06-30 MED ORDER — TRIAMCINOLONE ACETONIDE 40 MG/ML IJ SUSP
INTRAMUSCULAR | Status: AC
  Filled 2017-06-30: qty 1

## 2017-06-30 MED ORDER — LACTATED RINGERS IV SOLN
1000.0000 mL | INTRAVENOUS | Status: DC
  Administered 2017-06-30: 1000 mL via INTRAVENOUS

## 2017-06-30 MED ORDER — LIDOCAINE HCL (PF) 1 % IJ SOLN
INTRAMUSCULAR | Status: AC
  Filled 2017-06-30: qty 5

## 2017-06-30 MED ORDER — TRIAMCINOLONE ACETONIDE 40 MG/ML IJ SUSP
40.0000 mg | Freq: Once | INTRAMUSCULAR | Status: AC
  Administered 2017-06-30: 40 mg

## 2017-06-30 MED ORDER — LIDOCAINE HCL (PF) 1 % IJ SOLN
5.0000 mL | Freq: Once | INTRAMUSCULAR | Status: AC
  Administered 2017-06-30: 5 mL via SUBCUTANEOUS

## 2017-06-30 MED ORDER — IOPAMIDOL (ISOVUE-M 200) INJECTION 41%
INTRAMUSCULAR | Status: AC
  Filled 2017-06-30: qty 10

## 2017-06-30 MED ORDER — MIDAZOLAM HCL 2 MG/2ML IJ SOLN
5.0000 mg | Freq: Once | INTRAMUSCULAR | Status: AC
  Administered 2017-06-30: 5 mg via INTRAVENOUS

## 2017-06-30 MED ORDER — ROPIVACAINE HCL 2 MG/ML IJ SOLN
INTRAMUSCULAR | Status: AC
  Filled 2017-06-30: qty 10

## 2017-06-30 NOTE — Progress Notes (Signed)
Nursing Pain Medication Assessment:  Safety precautions to be maintained throughout the outpatient stay will include: orient to surroundings, keep bed in low position, maintain call bell within reach at all times, provide assistance with transfer out of bed and ambulation.  Medication Inspection Compliance: Pill count conducted under aseptic conditions, in front of the patient. Neither the pills nor the bottle was removed from the patient's sight at any time. Once count was completed pills were immediately returned to the patient in their original bottle.  Medication: Hydrocodone/APAP Pill/Patch Count: 32 of 60 pills remain Pill/Patch Appearance: Markings consistent with prescribed medication Bottle Appearance: Standard pharmacy container. Clearly labeled. Filled Date:11/30/ 2019 Last Medication intake:  Today

## 2017-06-30 NOTE — Patient Instructions (Signed)
You were given one prescription for Hydrocodone today. Pain Management Discharge Instructions  General Discharge Instructions :  If you need to reach your doctor call: Monday-Friday 8:00 am - 4:00 pm at [phone] or toll free [phone].  After clinic hours [phone] to have operator reach doctor.  Bring all of your medication bottles to all your appointments in the pain clinic.  To cancel or reschedule your appointment with Pain Management please remember to call 24 hours in advance to avoid a fee.  Refer to the educational materials which you have been given on: General Risks, I had my Procedure. Discharge Instructions, Post Sedation.  Post Procedure Instructions:  The drugs you were given will stay in your system until tomorrow, so for the next 24 hours you should not drive, make any legal decisions or drink any alcoholic beverages.  You may eat anything you prefer, but it is better to start with liquids then soups and crackers, and gradually work up to solid foods.  Please notify your doctor immediately if you have any unusual bleeding, trouble breathing or pain that is not related to your normal pain.  Depending on the type of procedure that was done, some parts of your body may feel week and/or numb.  This usually clears up by tonight or the next day.  Walk with the use of an assistive device or accompanied by an adult for the 24 hours.  You may use ice on the affected area for the first 24 hours.  Put ice in a Ziploc bag and cover with a towel and place against area 15 minutes on 15 minutes off.  You may switch to heat after 24 hours.

## 2017-07-01 ENCOUNTER — Telehealth: Payer: Self-pay

## 2017-07-01 NOTE — Telephone Encounter (Signed)
Post procedure phone call. Patient states she is doing good.  

## 2017-07-03 NOTE — Progress Notes (Signed)
Subjective:  Patient ID: Katherine Lester, female    DOB: [birth year]  Age: 62 y.o. MRN: [id number]  CC: Back Pain (lower)   Procedure: L5-S1 epidural steroid under fluoroscopic guidance with moderate sedation  HPI Katherine Lester presents for reevaluation.  She was last seen 2 months ago and has had 2 previous epidural steroid injections.  She reports a 75% improvement in her lower leg pain and low back pain.  She has experienced no problems with bowel or bladder function or lower extremity strength or function.  She is considered a non-surgical candidate and has failed conservative therapy with physical therapy modalities.  Unfortunately the combination of physical therapy and medication management alone has been ineffective in keeping her pain under good control.  She has had previous epidural injections and generally gets several months worth of dramatic relief with the injections.  She presents today requesting repeat epidural injection.  We have reviewed her narcotic assessment sheet and she continues to derive good functional lifestyle improvement with her medication management.  Outpatient Medications Prior to Visit  Medication Sig Dispense Refill  . cetirizine (ZYRTEC) 10 MG tablet Take 10 mg by mouth.    . Cholecalciferol (VITAMIN D3) 10000 UNITS capsule Take 50,000 Units by mouth once a week.     . cyclobenzaprine (FLEXERIL) 10 MG tablet Limit 1 tab by mouth 2-4 times a day if tolerated 100 tablet 0  . DULoxetine (CYMBALTA) 20 MG capsule Limit 1 capsule by mouth per day if tolerated 30 capsule 0  . pantoprazole (PROTONIX) 40 MG tablet Take 40 mg by mouth daily.     . polyethylene glycol (MIRALAX / GLYCOLAX) packet     . zolpidem (AMBIEN) 5 MG tablet Take 5 mg by mouth at bedtime.    Marland Kitchen HYDROcodone-acetaminophen (NORCO/VICODIN) 5-325 MG tablet Take 1 tablet by mouth 2 (two) times daily. 60 tablet 0  . levofloxacin (LEVAQUIN) 500 MG tablet Take 1 tablet by mouth daily.    .  ondansetron (ZOFRAN-ODT) 4 MG disintegrating tablet Take 4 mg by mouth every 8 (eight) hours as needed for nausea or vomiting.     Facility-Administered Medications Prior to Visit  Medication Dose Route Frequency Provider Last Rate Last Dose  . bupivacaine (PF) (MARCAINE) 0.25 % injection 30 mL  30 mL Other Once Mohammed Kindle, MD      . bupivacaine (PF) (MARCAINE) 0.25 % injection 30 mL  30 mL Other Once Mohammed Kindle, MD      . bupivacaine (PF) (MARCAINE) 0.25 % injection 30 mL  30 mL Other Once Mohammed Kindle, MD      . bupivacaine (PF) (MARCAINE) 0.25 % injection 30 mL  30 mL Other Once Mohammed Kindle, MD      . fentaNYL (SUBLIMAZE) injection 100 mcg  100 mcg Intravenous Once Mohammed Kindle, MD      . fentaNYL (SUBLIMAZE) injection 100 mcg  100 mcg Intravenous Once Mohammed Kindle, MD      . lactated ringers infusion 1,000 mL  1,000 mL Intravenous Continuous Mohammed Kindle, MD      . lactated ringers infusion 1,000 mL  1,000 mL Intravenous Continuous Mohammed Kindle, MD      . lactated ringers infusion 1,000 mL  1,000 mL Intravenous Continuous Mohammed Kindle, MD 125 mL/hr at 10/15/15 0923 1,000 mL at 10/15/15 0923  . lactated ringers infusion 1,000 mL  1,000 mL Intravenous Continuous Mohammed Kindle, MD 125 mL/hr at 01/12/16 0950 1,000 mL at 01/12/16 0950  . midazolam (VERSED)  5 MG/5ML injection 5 mg  5 mg Intravenous Once Mohammed Kindle, MD      . midazolam (VERSED) 5 MG/5ML injection 5 mg  5 mg Intravenous Once Mohammed Kindle, MD      . orphenadrine (NORFLEX) injection 60 mg  60 mg Intramuscular Once Mohammed Kindle, MD      . orphenadrine (NORFLEX) injection 60 mg  60 mg Intramuscular Once Mohammed Kindle, MD      . orphenadrine (NORFLEX) injection 60 mg  60 mg Intramuscular Once Mohammed Kindle, MD      . orphenadrine (NORFLEX) injection 60 mg  60 mg Intramuscular Once Mohammed Kindle, MD      . sodium chloride flush (NS) 0.9 % injection 20 mL  20 mL Other Once Mohammed Kindle, MD      . sodium  chloride flush (NS) 0.9 % injection 20 mL  20 mL Other Once Mohammed Kindle, MD      . triamcinolone acetonide (KENALOG-40) injection 40 mg  40 mg Other Once Mohammed Kindle, MD      . triamcinolone acetonide (KENALOG-40) injection 40 mg  40 mg Other Once Mohammed Kindle, MD      . triamcinolone acetonide (KENALOG-40) injection 40 mg  40 mg Other Once Mohammed Kindle, MD      . triamcinolone acetonide (KENALOG-40) injection 40 mg  40 mg Other Once Mohammed Kindle, MD      . triamcinolone acetonide (KENALOG-40) injection 40 mg  40 mg Other Once Mohammed Kindle, MD        Review of Systems CNS: No confusion or sedation Cardiac: No angina or palpitations GI: No abdominal pain or constipation Constitutional: No nausea vomiting fevers or chills  Objective:  BP (!) 156/84   Pulse 85   Temp 98.4 F (36.9 C) (Oral)   Resp 13   Ht 5\' 6"  (1.676 m)   Wt 178 lb (80.7 kg)   SpO2 100%   BMI 28.73 kg/m    BP Readings from Last 3 Encounters:  06/30/17 (!) 156/84  04/28/17 124/82  03/04/17 122/78     Wt Readings from Last 3 Encounters:  06/30/17 178 lb (80.7 kg)  04/28/17 175 lb (79.4 kg)  03/04/17 168 lb (76.2 kg)     Physical Exam Pt is alert and oriented PERRL EOMI HEART IS RRR no murmur or rub LCTA no wheezing or rales MUSCULOSKELETAL reveals some paraspinous muscle tenderness but no overt trigger points.  She has a positive straight leg raise on the left side negative on the right her muscle tone and bulk is good  Labs  No results found for: HGBA1C Lab Results  Component Value Date   CREATININE 0.72 03/04/2017    -------------------------------------------------------------------------------------------------------------------- Lab Results  Component Value Date   WBC 12.0 (H) 03/04/2017   HGB 15.3 03/04/2017   HCT 44.2 03/04/2017   PLT 356 03/04/2017   GLUCOSE 114 (H) 03/04/2017   NA 139 03/04/2017   K 3.5 03/04/2017   CL 103 03/04/2017   CREATININE 0.72 03/04/2017    BUN 12 03/04/2017   CO2 26 03/04/2017   TSH 0.590 03/04/2017    --------------------------------------------------------------------------------------------------------------------- Dg C-arm 1-60 Min-no Report  Result Date: 06/30/2017 Fluoroscopy was utilized by the requesting physician.  No radiographic interpretation.     Assessment & Plan:   Katherine Lester was seen today for back pain.  Diagnoses and all orders for this visit:  Degenerative disc disease, lumbar  Chronic, continuous use of opioids  DDD (degenerative disc disease), lumbar  Chronic  bilateral low back pain with bilateral sciatica -     Lumbar Epidural Injection; Future  Facet syndrome, lumbar  Other orders -     triamcinolone acetonide (KENALOG-40) injection 40 mg -     sodium chloride flush (NS) 0.9 % injection 10 mL -     ropivacaine (PF) 2 mg/mL (0.2%) (NAROPIN) injection 10 mL -     midazolam (VERSED) injection 5 mg -     lidocaine (PF) (XYLOCAINE) 1 % injection 5 mL -     lactated ringers infusion 1,000 mL -     iopamidol (ISOVUE-M) 41 % intrathecal injection 20 mL -     HYDROcodone-acetaminophen (NORCO/VICODIN) 5-325 MG tablet; Take 1 tablet by mouth 2 (two) times daily.        ----------------------------------------------------------------------------------------------------------------------  Problem List Items Addressed This Visit      Unprioritized   Degenerative disc disease, lumbar - Primary   Relevant Medications   triamcinolone acetonide (KENALOG-40) injection 40 mg (Completed)   HYDROcodone-acetaminophen (NORCO/VICODIN) 5-325 MG tablet   Facet syndrome, lumbar   Relevant Medications   triamcinolone acetonide (KENALOG-40) injection 40 mg (Completed)   HYDROcodone-acetaminophen (NORCO/VICODIN) 5-325 MG tablet    Other Visit Diagnoses    Chronic, continuous use of opioids       DDD (degenerative disc disease), lumbar       Relevant Medications   triamcinolone acetonide (KENALOG-40)  injection 40 mg (Completed)   HYDROcodone-acetaminophen (NORCO/VICODIN) 5-325 MG tablet   Chronic bilateral low back pain with bilateral sciatica       Relevant Medications   triamcinolone acetonide (KENALOG-40) injection 40 mg (Completed)   midazolam (VERSED) injection 5 mg (Completed)   HYDROcodone-acetaminophen (NORCO/VICODIN) 5-325 MG tablet   Other Relevant Orders   Lumbar Epidural Injection        ----------------------------------------------------------------------------------------------------------------------  1. Degenerative disc disease, lumbar We will proceed with her third epidural in series today.  Unfortunately she is failed conservative therapy and responded favorably to these in the past.  Her pain seems to be improving with the epidural administration.  Unfortunately she is considered a nonsurgical candidate with persistent low back pain despite medication management.  Gone over the risks and benefits of the procedure with her in full detail and all questions answered.  She is to return to clinic in 2 months.  2. Chronic, continuous use of opioids We reviewed the [facility] practitioner database information and it is appropriate.  Refills will be given for Vicodin No. 60 anywhere 31st fill date twice daily dosing.  3. DDD (degenerative disc disease), lumbar As above  4. Chronic bilateral low back pain with bilateral sciatica As above - Lumbar Epidural Injection; Future  5. Facet syndrome, lumbar Continue core stretching strengthening exercises as tolerated    ----------------------------------------------------------------------------------------------------------------------  I am having Katherine Lester maintain her Vitamin D3, zolpidem, cyclobenzaprine, DULoxetine, pantoprazole, cetirizine, polyethylene glycol, levofloxacin, ondansetron, and HYDROcodone-acetaminophen. We administered triamcinolone acetonide, sodium chloride flush, ropivacaine (PF) 2  mg/mL (0.2%), midazolam, lidocaine (PF), lactated ringers, and iopamidol. We will continue to administer bupivacaine (PF), fentaNYL, lactated ringers, midazolam, orphenadrine, triamcinolone acetonide, bupivacaine (PF), fentaNYL, lactated ringers, midazolam, orphenadrine, triamcinolone acetonide, bupivacaine (PF), orphenadrine, triamcinolone acetonide, lactated ringers, sodium chloride flush, triamcinolone acetonide, lactated ringers, bupivacaine (PF), orphenadrine, triamcinolone acetonide, and sodium chloride flush.   Meds ordered this encounter  Medications  . triamcinolone acetonide (KENALOG-40) injection 40 mg  . sodium chloride flush (NS) 0.9 % injection 10 mL  . ropivacaine (PF) 2 mg/mL (0.2%) (NAROPIN) injection 10  mL  . midazolam (VERSED) injection 5 mg  . lidocaine (PF) (XYLOCAINE) 1 % injection 5 mL  . lactated ringers infusion 1,000 mL  . iopamidol (ISOVUE-M) 41 % intrathecal injection 20 mL  . HYDROcodone-acetaminophen (NORCO/VICODIN) 5-325 MG tablet    Sig: Take 1 tablet by mouth 2 (two) times daily.    Dispense:  60 tablet    Refill:  0   Patient's Medications  New Prescriptions   No medications on file  Previous Medications   CETIRIZINE (ZYRTEC) 10 MG TABLET    Take 10 mg by mouth.   CHOLECALCIFEROL (VITAMIN D3) 10000 UNITS CAPSULE    Take 50,000 Units by mouth once a week.    CYCLOBENZAPRINE (FLEXERIL) 10 MG TABLET    Limit 1 tab by mouth 2-4 times a day if tolerated   DULOXETINE (CYMBALTA) 20 MG CAPSULE    Limit 1 capsule by mouth per day if tolerated   LEVOFLOXACIN (LEVAQUIN) 500 MG TABLET    Take 1 tablet by mouth daily.   ONDANSETRON (ZOFRAN-ODT) 4 MG DISINTEGRATING TABLET    Take 4 mg by mouth every 8 (eight) hours as needed for nausea or vomiting.   PANTOPRAZOLE (PROTONIX) 40 MG TABLET    Take 40 mg by mouth daily.    POLYETHYLENE GLYCOL (MIRALAX / GLYCOLAX) PACKET       ZOLPIDEM (AMBIEN) 5 MG TABLET    Take 5 mg by mouth at bedtime.  Modified Medications    Modified Medication Previous Medication   HYDROCODONE-ACETAMINOPHEN (NORCO/VICODIN) 5-325 MG TABLET HYDROcodone-acetaminophen (NORCO/VICODIN) 5-325 MG tablet      Take 1 tablet by mouth 2 (two) times daily.    Take 1 tablet by mouth 2 (two) times daily.  Discontinued Medications   No medications on file   ----------------------------------------------------------------------------------------------------------------------  Follow-up: Return for evaluation, procedure.  Procedure: L5-S1 epidural steroid under fluoroscopic guidance with moderate sedation   Procedure: L5-S1 LESI with fluoroscopic guidance and moderate sedation  NOTE: The risks, benefits, and expectations of the procedure have been discussed and explained to the patient who was understanding and in agreement with suggested treatment plan. No guarantees were made.  DESCRIPTION OF PROCEDURE: Lumbar epidural steroid injection with  IV Versed, EKG, blood pressure, pulse, and pulse oximetry monitoring. The procedure was performed with the patient in the prone position under fluoroscopic guidance.  Sterile prep x3 was initiated and I then injected subcutaneous lidocaine to the overlying L5-S1 site after its fluoroscopic identifictation.  Using strict aseptic technique, I then advanced an 18-gauge Tuohy epidural needle in the midline using interlaminar approach via loss-of-resistance to saline technique. There was negative aspiration for heme or  CSF.  I then confirmed position with both AP and Lateral fluoroscan.  2 cc of Isovue were injected and a  total of 5 mL of Preservative-Free normal saline mixed with 40 mg of Kenalog and 1cc Ropicaine 0.2 percent were injected incrementally via the  epidurally placed needle. The needle was removed. The patient tolerated the injection well and was convalesced and discharged to home in stable condition. Should the patient have any post procedure difficulty they have been instructed on how to contact us  for assistance.   Molli Barrows, MD

## 2017-07-26 ENCOUNTER — Ambulatory Visit: Payer: BC Managed Care – PPO | Admitting: Anesthesiology

## 2017-08-22 ENCOUNTER — Ambulatory Visit: Payer: BC Managed Care – PPO | Admitting: Anesthesiology

## 2017-09-12 ENCOUNTER — Telehealth: Payer: Self-pay | Admitting: Anesthesiology

## 2017-09-12 NOTE — Telephone Encounter (Signed)
OK to put patient on Katherine David NP for Wednesday at [facility] 09-14-17.  Please call patient and schedule her for med refill.

## 2017-09-12 NOTE — Telephone Encounter (Signed)
Patient had to be schedule out to 09-22-17 due to procedure sched. She will be out of meds in couple days. Can she pick up script for enough meds to last until appt. Please let patient know

## 2017-09-13 NOTE — Telephone Encounter (Signed)
Called patient and lvmail offering appt w/ Crystal for meds

## 2017-09-22 ENCOUNTER — Encounter: Payer: Self-pay | Admitting: Anesthesiology

## 2017-09-22 ENCOUNTER — Ambulatory Visit (HOSPITAL_BASED_OUTPATIENT_CLINIC_OR_DEPARTMENT_OTHER): Payer: BC Managed Care – PPO | Admitting: Anesthesiology

## 2017-09-22 ENCOUNTER — Other Ambulatory Visit: Payer: Self-pay | Admitting: Anesthesiology

## 2017-09-22 ENCOUNTER — Ambulatory Visit
Admission: RE | Admit: 2017-09-22 | Discharge: 2017-09-22 | Disposition: A | Discharge status: Home / Self Care | Payer: BC Managed Care – PPO | Source: Ambulatory Visit | Attending: Anesthesiology | Admitting: Anesthesiology

## 2017-09-22 ENCOUNTER — Other Ambulatory Visit: Payer: Self-pay

## 2017-09-22 VITALS — BP 122/78 | HR 92 | Temp 97.9°F | Resp 24 | Ht 66.0 in | Wt 170.0 lb

## 2017-09-22 DIAGNOSIS — M79604 Pain in right leg: Secondary | ICD-10-CM | POA: Insufficient documentation

## 2017-09-22 DIAGNOSIS — M47817 Spondylosis without myelopathy or radiculopathy, lumbosacral region: Secondary | ICD-10-CM

## 2017-09-22 DIAGNOSIS — R52 Pain, unspecified: Secondary | ICD-10-CM

## 2017-09-22 DIAGNOSIS — Z79891 Long term (current) use of opiate analgesic: Secondary | ICD-10-CM | POA: Insufficient documentation

## 2017-09-22 DIAGNOSIS — M5442 Lumbago with sciatica, left side: Secondary | ICD-10-CM | POA: Insufficient documentation

## 2017-09-22 DIAGNOSIS — M5136 Other intervertebral disc degeneration, lumbar region: Secondary | ICD-10-CM | POA: Insufficient documentation

## 2017-09-22 DIAGNOSIS — M545 Low back pain: Secondary | ICD-10-CM | POA: Diagnosis present

## 2017-09-22 DIAGNOSIS — G894 Chronic pain syndrome: Secondary | ICD-10-CM

## 2017-09-22 DIAGNOSIS — M5441 Lumbago with sciatica, right side: Secondary | ICD-10-CM

## 2017-09-22 DIAGNOSIS — F1190 Opioid use, unspecified, uncomplicated: Secondary | ICD-10-CM

## 2017-09-22 DIAGNOSIS — G8929 Other chronic pain: Secondary | ICD-10-CM

## 2017-09-22 MED ORDER — HYDROCODONE-ACETAMINOPHEN 5-325 MG PO TABS: 1.0000 | ORAL_TABLET | Freq: Two times a day (BID) | ORAL | 0 refills | Status: DC

## 2017-09-22 MED ORDER — SODIUM CHLORIDE 0.9 % IJ SOLN
INTRAMUSCULAR | Status: AC
  Filled 2017-09-22: qty 10

## 2017-09-22 MED ORDER — LACTATED RINGERS IV SOLN
1000.0000 mL | INTRAVENOUS | Status: DC
  Administered 2017-09-22: 1000 mL via INTRAVENOUS

## 2017-09-22 MED ORDER — HYDROCODONE-ACETAMINOPHEN 5-325 MG PO TABS
1.0000 | ORAL_TABLET | Freq: Two times a day (BID) | ORAL | 0 refills | Status: DC
Start: 2017-09-22 — End: 2017-11-10

## 2017-09-22 MED ORDER — TRIAMCINOLONE ACETONIDE 40 MG/ML IJ SUSP
40.0000 mg | Freq: Once | INTRAMUSCULAR | Status: AC
  Administered 2017-09-22: 40 mg

## 2017-09-22 MED ORDER — ROPIVACAINE HCL 2 MG/ML IJ SOLN
10.0000 mL | Freq: Once | INTRAMUSCULAR | Status: AC
  Administered 2017-09-22: 10 mL via EPIDURAL

## 2017-09-22 MED ORDER — MIDAZOLAM HCL 5 MG/5ML IJ SOLN
INTRAMUSCULAR | Status: AC
  Filled 2017-09-22: qty 5

## 2017-09-22 MED ORDER — LIDOCAINE HCL (PF) 1 % IJ SOLN
5.0000 mL | Freq: Once | INTRAMUSCULAR | Status: AC
  Administered 2017-09-22: 5 mL via SUBCUTANEOUS

## 2017-09-22 MED ORDER — IOPAMIDOL (ISOVUE-M 200) INJECTION 41%
20.0000 mL | Freq: Once | INTRAMUSCULAR | Status: DC | PRN
Start: 2017-09-22 — End: 2017-09-23
  Administered 2017-09-22: 10 mL
  Filled 2017-09-22: qty 20

## 2017-09-22 MED ORDER — DEXAMETHASONE SODIUM PHOSPHATE 10 MG/ML IJ SOLN
10.0000 mg | Freq: Once | INTRAMUSCULAR | Status: AC
  Administered 2017-09-22: 10 mg

## 2017-09-22 MED ORDER — ROPIVACAINE HCL 2 MG/ML IJ SOLN
INTRAMUSCULAR | Status: AC
  Filled 2017-09-22: qty 10

## 2017-09-22 MED ORDER — TRIAMCINOLONE ACETONIDE 40 MG/ML IJ SUSP
INTRAMUSCULAR | Status: AC
  Filled 2017-09-22: qty 1

## 2017-09-22 MED ORDER — DEXAMETHASONE SODIUM PHOSPHATE 10 MG/ML IJ SOLN
INTRAMUSCULAR | Status: AC
  Filled 2017-09-22: qty 1

## 2017-09-22 MED ORDER — SODIUM CHLORIDE 0.9% FLUSH
10.0000 mL | Freq: Once | INTRAVENOUS | Status: AC
  Administered 2017-09-22: 10 mL

## 2017-09-22 MED ORDER — MIDAZOLAM HCL 5 MG/5ML IJ SOLN
5.0000 mg | Freq: Once | INTRAMUSCULAR | Status: AC
  Administered 2017-09-22: 4 mg via INTRAVENOUS

## 2017-09-22 MED ORDER — IOPAMIDOL (ISOVUE-M 200) INJECTION 41%
INTRAMUSCULAR | Status: AC
  Filled 2017-09-22: qty 10

## 2017-09-22 NOTE — Progress Notes (Signed)
Subjective:  Patient ID: Katherine Lester, female    DOB: [birth year]  Age: 62 y.o. MRN: [id number]  CC: Back Pain (right, lower)   Procedure: L5-S1 epidural steroid under fluoroscopic guidance #2 with moderate sedation and bilateral lumbar trigger point injection x2  HPI Katherine Lester presents for reevaluation.  She was last seen back in January.  She had an epidural steroid injection then and received 75% improvement in her low back pain and right lower extremity pain.  Generally the relief she experiences last for approximately 2 months and then she has gradual recurrence.  She is been out of medication and has had a considerable amount of pain in the right lower back radiating in the right posterior and lateral leg.  She is also getting some bilateral low back pain.  No change in lower extremity strength or function has been noted.  Her bowel bladder function has been stable.  In the past she has used hydrocodone effectively generally taking approximately 1 and occasionally 2 tablets/day giving her good relief for an otherwise very recalcitrant type of low back pain.  She does her stretching strengthening exercises but with the type of work she is doing, she cannot get any relief.  Furthermore at this time she is not sleeping well secondary to severe pain she is experiencing at night.  The quality characteristic distribution of the pain are similar to what she has had in the past where she has received good relief with the epidural steroid injections.  Outpatient Medications Prior to Visit  Medication Sig Dispense Refill  . busPIRone (BUSPAR) 10 MG tablet Take 10 mg by mouth 3 (three) times daily.    . cetirizine (ZYRTEC) 10 MG tablet Take 10 mg by mouth.    . Cholecalciferol (VITAMIN D3) 10000 UNITS capsule Take 50,000 Units by mouth once a week.     . DULoxetine (CYMBALTA) 20 MG capsule Limit 1 capsule by mouth per day if tolerated (Patient taking differently: 60 mg. Limit 1 capsule by  mouth per day if tolerated) 30 capsule 0  . pantoprazole (PROTONIX) 40 MG tablet Take 40 mg by mouth daily.     . polyethylene glycol (MIRALAX / GLYCOLAX) packet     . zolpidem (AMBIEN) 5 MG tablet Take 5 mg by mouth at bedtime.    Marland Kitchen HYDROcodone-acetaminophen (NORCO/VICODIN) 5-325 MG tablet Take 1 tablet by mouth 2 (two) times daily. 60 tablet 0  . cyclobenzaprine (FLEXERIL) 10 MG tablet Limit 1 tab by mouth 2-4 times a day if tolerated (Patient not taking: Reported on 09/22/2017) 100 tablet 0  . levofloxacin (LEVAQUIN) 500 MG tablet Take 1 tablet by mouth daily.    . ondansetron (ZOFRAN-ODT) 4 MG disintegrating tablet Take 4 mg by mouth every 8 (eight) hours as needed for nausea or vomiting.     Facility-Administered Medications Prior to Visit  Medication Dose Route Frequency Provider Last Rate Last Dose  . bupivacaine (PF) (MARCAINE) 0.25 % injection 30 mL  30 mL Other Once Mohammed Kindle, MD      . bupivacaine (PF) (MARCAINE) 0.25 % injection 30 mL  30 mL Other Once Mohammed Kindle, MD      . bupivacaine (PF) (MARCAINE) 0.25 % injection 30 mL  30 mL Other Once Mohammed Kindle, MD      . bupivacaine (PF) (MARCAINE) 0.25 % injection 30 mL  30 mL Other Once Mohammed Kindle, MD      . fentaNYL (SUBLIMAZE) injection 100 mcg  100 mcg Intravenous  Once Mohammed Kindle, MD      . fentaNYL (SUBLIMAZE) injection 100 mcg  100 mcg Intravenous Once Mohammed Kindle, MD      . lactated ringers infusion 1,000 mL  1,000 mL Intravenous Continuous Mohammed Kindle, MD      . lactated ringers infusion 1,000 mL  1,000 mL Intravenous Continuous Mohammed Kindle, MD      . lactated ringers infusion 1,000 mL  1,000 mL Intravenous Continuous Mohammed Kindle, MD 125 mL/hr at 10/15/15 0923 1,000 mL at 10/15/15 0923  . lactated ringers infusion 1,000 mL  1,000 mL Intravenous Continuous Mohammed Kindle, MD 125 mL/hr at 01/12/16 0950 1,000 mL at 01/12/16 0950  . midazolam (VERSED) 5 MG/5ML injection 5 mg  5 mg Intravenous Once Mohammed Kindle, MD      . midazolam (VERSED) 5 MG/5ML injection 5 mg  5 mg Intravenous Once Mohammed Kindle, MD      . orphenadrine (NORFLEX) injection 60 mg  60 mg Intramuscular Once Mohammed Kindle, MD      . orphenadrine (NORFLEX) injection 60 mg  60 mg Intramuscular Once Mohammed Kindle, MD      . orphenadrine (NORFLEX) injection 60 mg  60 mg Intramuscular Once Mohammed Kindle, MD      . orphenadrine (NORFLEX) injection 60 mg  60 mg Intramuscular Once Mohammed Kindle, MD      . sodium chloride flush (NS) 0.9 % injection 20 mL  20 mL Other Once Mohammed Kindle, MD      . sodium chloride flush (NS) 0.9 % injection 20 mL  20 mL Other Once Mohammed Kindle, MD      . triamcinolone acetonide (KENALOG-40) injection 40 mg  40 mg Other Once Mohammed Kindle, MD      . triamcinolone acetonide (KENALOG-40) injection 40 mg  40 mg Other Once Mohammed Kindle, MD      . triamcinolone acetonide (KENALOG-40) injection 40 mg  40 mg Other Once Mohammed Kindle, MD      . triamcinolone acetonide (KENALOG-40) injection 40 mg  40 mg Other Once Mohammed Kindle, MD      . triamcinolone acetonide (KENALOG-40) injection 40 mg  40 mg Other Once Mohammed Kindle, MD        Review of Systems CNS: No confusion or sedation Cardiac: No angina or palpitations GI: No abdominal pain or constipation Constitutional: No nausea vomiting fevers or chills  Objective:  BP 120/75   Pulse 93   Temp 98.1 F (36.7 C) (Oral)   Resp (!) 22   Ht 5\' 6"  (1.676 m)   Wt 170 lb (77.1 kg)   SpO2 99%   BMI 27.44 kg/m    BP Readings from Last 3 Encounters:  09/22/17 120/75  06/30/17 (!) 156/84  04/28/17 124/82     Wt Readings from Last 3 Encounters:  09/22/17 170 lb (77.1 kg)  06/30/17 178 lb (80.7 kg)  04/28/17 175 lb (79.4 kg)     Physical Exam Pt is alert and oriented PERRL EOMI HEART IS RRR no murmur or rub LCTA no wheezing or rales MUSCULOSKELETAL reveals 2 trigger points approximately 5 cm from the midline at the L5 level and  lumbar spinous muscles bilaterally.  She also has a positive straight leg raise on the right side and her muscle tone and bulk is at baseline.  Labs  No results found for: HGBA1C Lab Results  Component Value Date   CREATININE 0.72 03/04/2017    -------------------------------------------------------------------------------------------------------------------- Lab Results  Component Value Date  WBC 12.0 (H) 03/04/2017   HGB 15.3 03/04/2017   HCT 44.2 03/04/2017   PLT 356 03/04/2017   GLUCOSE 114 (H) 03/04/2017   NA 139 03/04/2017   K 3.5 03/04/2017   CL 103 03/04/2017   CREATININE 0.72 03/04/2017   BUN 12 03/04/2017   CO2 26 03/04/2017   TSH 0.590 03/04/2017    --------------------------------------------------------------------------------------------------------------------- No results found.   Assessment & Plan:   Katherine Lester was seen today for back pain.  Diagnoses and all orders for this visit:  Degenerative disc disease, lumbar  Chronic bilateral low back pain with bilateral sciatica -     Lumbar Epidural Injection  Chronic, continuous use of opioids -     ToxASSURE Select 13 (MW), Urine  Lumbosacral spondylosis without myelopathy  Acute bilateral low back pain with bilateral sciatica  Chronic pain syndrome -     ToxASSURE Select 13 (MW), Urine  Other orders -     triamcinolone acetonide (KENALOG-40) injection 40 mg -     sodium chloride flush (NS) 0.9 % injection 10 mL -     ropivacaine (PF) 2 mg/mL (0.2%) (NAROPIN) injection 10 mL -     midazolam (VERSED) 5 MG/5ML injection 5 mg -     lidocaine (PF) (XYLOCAINE) 1 % injection 5 mL -     lactated ringers infusion 1,000 mL -     iopamidol (ISOVUE-M) 41 % intrathecal injection 20 mL -     dexamethasone (DECADRON) injection 10 mg -     Discontinue: HYDROcodone-acetaminophen (NORCO/VICODIN) 5-325 MG tablet; Take 1 tablet by mouth 2 (two) times daily. -     HYDROcodone-acetaminophen (NORCO/VICODIN) 5-325 MG  tablet; Take 1 tablet by mouth 2 (two) times daily.        ----------------------------------------------------------------------------------------------------------------------  Problem List Items Addressed This Visit      Unprioritized   Degenerative disc disease, lumbar - Primary   Relevant Medications   triamcinolone acetonide (KENALOG-40) injection 40 mg   dexamethasone (DECADRON) injection 10 mg   HYDROcodone-acetaminophen (NORCO/VICODIN) 5-325 MG tablet    Other Visit Diagnoses    Chronic bilateral low back pain with bilateral sciatica       Relevant Medications   busPIRone (BUSPAR) 10 MG tablet   triamcinolone acetonide (KENALOG-40) injection 40 mg   midazolam (VERSED) 5 MG/5ML injection 5 mg   dexamethasone (DECADRON) injection 10 mg   HYDROcodone-acetaminophen (NORCO/VICODIN) 5-325 MG tablet   Chronic, continuous use of opioids       Relevant Orders   ToxASSURE Select 13 (MW), Urine   Lumbosacral spondylosis without myelopathy       Relevant Medications   triamcinolone acetonide (KENALOG-40) injection 40 mg   dexamethasone (DECADRON) injection 10 mg   HYDROcodone-acetaminophen (NORCO/VICODIN) 5-325 MG tablet   Acute bilateral low back pain with bilateral sciatica       Relevant Medications   triamcinolone acetonide (KENALOG-40) injection 40 mg   dexamethasone (DECADRON) injection 10 mg   HYDROcodone-acetaminophen (NORCO/VICODIN) 5-325 MG tablet   Chronic pain syndrome       Relevant Orders   ToxASSURE Select 13 (MW), Urine        ----------------------------------------------------------------------------------------------------------------------  1. Chronic bilateral low back pain with bilateral sciatica We will proceed with a second epidural injection today.  Her previous injection in January gave her significant improvement in her low back pain and leg pain.  We have gone over the risks and benefits of the procedure with her in full detail and all  questions answered.  She  is to return to clinic in 2 months for reevaluation possible repeat injection at that time.  In the meantime continue with core stretching strengthening exercises as once again reviewed today. - Lumbar Epidural Injection  2. Degenerative disc disease, lumbar As above  3. Chronic, continuous use of opioids We have gone over the [facility] practitioner database information and it is appropriate.  I am going to reinitiate her hydrocodone for twice daily dosing as needed for the next month.  We will give her another prescription due in 30 days for 45 tablets for the following month as well. - ToxASSURE Select 13 (MW), Urine  4. Lumbosacral spondylosis without myelopathy I am also going to perform 2 trigger point injections to the aforementioned trigger points today.  Hopefully this will help with some of the back spasming she has been experiencing recently. As above  5. Acute bilateral low back pain with bilateral sciatica   6. Chronic pain syndrome I am also going to check a urine drug screen for routine clinic policy. - ToxASSURE Select 13 (MW), Urine    ----------------------------------------------------------------------------------------------------------------------  I am having Katherine Lester maintain her Vitamin D3, zolpidem, cyclobenzaprine, DULoxetine, pantoprazole, cetirizine, polyethylene glycol, levofloxacin, ondansetron, busPIRone, and HYDROcodone-acetaminophen. We administered lactated ringers. We will continue to administer bupivacaine (PF), fentaNYL, lactated ringers, midazolam, orphenadrine, triamcinolone acetonide, bupivacaine (PF), fentaNYL, lactated ringers, midazolam, orphenadrine, triamcinolone acetonide, bupivacaine (PF), orphenadrine, triamcinolone acetonide, lactated ringers, sodium chloride flush, triamcinolone acetonide, lactated ringers, bupivacaine (PF), orphenadrine, triamcinolone acetonide, and sodium chloride flush.   Meds  ordered this encounter  Medications  . triamcinolone acetonide (KENALOG-40) injection 40 mg  . sodium chloride flush (NS) 0.9 % injection 10 mL  . ropivacaine (PF) 2 mg/mL (0.2%) (NAROPIN) injection 10 mL  . midazolam (VERSED) 5 MG/5ML injection 5 mg  . lidocaine (PF) (XYLOCAINE) 1 % injection 5 mL  . lactated ringers infusion 1,000 mL  . iopamidol (ISOVUE-M) 41 % intrathecal injection 20 mL  . dexamethasone (DECADRON) injection 10 mg  . DISCONTD: HYDROcodone-acetaminophen (NORCO/VICODIN) 5-325 MG tablet    Sig: Take 1 tablet by mouth 2 (two) times daily.    Dispense:  60 tablet    Refill:  0  . HYDROcodone-acetaminophen (NORCO/VICODIN) 5-325 MG tablet    Sig: Take 1 tablet by mouth 2 (two) times daily.    Dispense:  45 tablet    Refill:  0    Do not fill until [id number]   Patient's Medications  New Prescriptions   No medications on file  Previous Medications   BUSPIRONE (BUSPAR) 10 MG TABLET    Take 10 mg by mouth 3 (three) times daily.   CETIRIZINE (ZYRTEC) 10 MG TABLET    Take 10 mg by mouth.   CHOLECALCIFEROL (VITAMIN D3) 10000 UNITS CAPSULE    Take 50,000 Units by mouth once a week.    CYCLOBENZAPRINE (FLEXERIL) 10 MG TABLET    Limit 1 tab by mouth 2-4 times a day if tolerated   DULOXETINE (CYMBALTA) 20 MG CAPSULE    Limit 1 capsule by mouth per day if tolerated   LEVOFLOXACIN (LEVAQUIN) 500 MG TABLET    Take 1 tablet by mouth daily.   ONDANSETRON (ZOFRAN-ODT) 4 MG DISINTEGRATING TABLET    Take 4 mg by mouth every 8 (eight) hours as needed for nausea or vomiting.   PANTOPRAZOLE (PROTONIX) 40 MG TABLET    Take 40 mg by mouth daily.    POLYETHYLENE GLYCOL (MIRALAX / GLYCOLAX) PACKET  ZOLPIDEM (AMBIEN) 5 MG TABLET    Take 5 mg by mouth at bedtime.  Modified Medications   Modified Medication Previous Medication   HYDROCODONE-ACETAMINOPHEN (NORCO/VICODIN) 5-325 MG TABLET HYDROcodone-acetaminophen (NORCO/VICODIN) 5-325 MG tablet      Take 1 tablet by mouth 2 (two) times daily.     Take 1 tablet by mouth 2 (two) times daily.  Discontinued Medications   No medications on file   ----------------------------------------------------------------------------------------------------------------------  Follow-up: Return for evaluation, procedure.  Procedure: L5-S1 epidural steroid No. 2 under fluoroscopic guidance with moderate sedation   Procedure: L5-S1 LESI with fluoroscopic guidance and moderate sedation  NOTE: The risks, benefits, and expectations of the procedure have been discussed and explained to the patient who was understanding and in agreement with suggested treatment plan. No guarantees were made.  DESCRIPTION OF PROCEDURE: Lumbar epidural steroid injection with 4 IV Versed, EKG, blood pressure, pulse, and pulse oximetry monitoring. The procedure was performed with the patient in the prone position under fluoroscopic guidance.  Sterile prep x3 was initiated and I then injected subcutaneous lidocaine to the overlying L5-S1 site after its fluoroscopic identifictation.  Using strict aseptic technique, I then advanced an 18-gauge Tuohy epidural needle in the midline using interlaminar approach via loss-of-resistance to saline technique. There was negative aspiration for heme or  CSF.  I then confirmed position with both AP and Lateral fluoroscan.  2 cc of Isovue were injected and a  total of 5 mL of Preservative-Free normal saline mixed with 40 mg of Kenalog and 1cc Ropicaine 0.2 percent were injected incrementally via the  epidurally placed needle. The needle was removed. The patient tolerated the injection well and was convalesced and discharged to home in stable condition. Should the patient have any post procedure difficulty they have been instructed on how to contact us for assistance.   Trigger point injection x2Trigger point injection: The area overlying the aforementioned trigger points were prepped with alcohol. They were then injected with a 25-gauge needle with 4  cc of ropivacaine 0.2% and Decadron 4 mg at each site after negative aspiration for heme. This was performed after informed consent was obtained and risks and benefits reviewed. She tolerated this procedure without difficulty and was convalesced and discharged to home in stable condition for follow-up as mentioned.  @James  Browning, MD@   Molli Barrows, MD

## 2017-09-22 NOTE — Progress Notes (Signed)
Nursing Pain Medication Assessment:  Safety precautions to be maintained throughout the outpatient stay will include: orient to surroundings, keep bed in low position, maintain call bell within reach at all times, provide assistance with transfer out of bed and ambulation.  Medication Inspection Compliance: Pill count conducted under aseptic conditions, in front of the patient. Neither the pills nor the bottle was removed from the patient's sight at any time. Once count was completed pills were immediately returned to the patient in their original bottle.  Medication: Hydrocodone/APAP Pill/Patch Count: 0 of 60 pills remain Pill/Patch Appearance: Markings consistent with prescribed medication Bottle Appearance: Standard pharmacy container. Clearly labeled. Filled Date: 02/19 / 2019 Last Medication intake:  Ran out of medicine more than 48 hours ago

## 2017-09-23 ENCOUNTER — Telehealth: Payer: Self-pay

## 2017-09-23 NOTE — Telephone Encounter (Signed)
Post procedure phone call.  Left message.  

## 2017-09-28 LAB — TOXASSURE SELECT 13 (MW), URINE

## 2017-11-10 ENCOUNTER — Other Ambulatory Visit: Payer: Self-pay | Admitting: Anesthesiology

## 2017-11-10 ENCOUNTER — Other Ambulatory Visit: Payer: Self-pay

## 2017-11-10 ENCOUNTER — Encounter: Payer: Self-pay | Admitting: Anesthesiology

## 2017-11-10 ENCOUNTER — Ambulatory Visit (HOSPITAL_BASED_OUTPATIENT_CLINIC_OR_DEPARTMENT_OTHER): Payer: BC Managed Care – PPO | Admitting: Anesthesiology

## 2017-11-10 ENCOUNTER — Ambulatory Visit
Admission: RE | Admit: 2017-11-10 | Discharge: 2017-11-10 | Disposition: A | Discharge status: Home / Self Care | Payer: BC Managed Care – PPO | Source: Ambulatory Visit | Attending: Anesthesiology | Admitting: Anesthesiology

## 2017-11-10 VITALS — BP 125/84 | HR 82 | Temp 98.0°F | Resp 14 | Ht 65.0 in | Wt 175.0 lb

## 2017-11-10 DIAGNOSIS — Z79891 Long term (current) use of opiate analgesic: Secondary | ICD-10-CM | POA: Insufficient documentation

## 2017-11-10 DIAGNOSIS — F1190 Opioid use, unspecified, uncomplicated: Secondary | ICD-10-CM

## 2017-11-10 DIAGNOSIS — M79606 Pain in leg, unspecified: Secondary | ICD-10-CM | POA: Insufficient documentation

## 2017-11-10 DIAGNOSIS — M5442 Lumbago with sciatica, left side: Secondary | ICD-10-CM

## 2017-11-10 DIAGNOSIS — M549 Dorsalgia, unspecified: Secondary | ICD-10-CM | POA: Diagnosis present

## 2017-11-10 DIAGNOSIS — M5136 Other intervertebral disc degeneration, lumbar region: Secondary | ICD-10-CM | POA: Diagnosis not present

## 2017-11-10 DIAGNOSIS — M5441 Lumbago with sciatica, right side: Secondary | ICD-10-CM | POA: Insufficient documentation

## 2017-11-10 DIAGNOSIS — R52 Pain, unspecified: Secondary | ICD-10-CM

## 2017-11-10 DIAGNOSIS — M545 Low back pain, unspecified: Secondary | ICD-10-CM

## 2017-11-10 DIAGNOSIS — G894 Chronic pain syndrome: Secondary | ICD-10-CM

## 2017-11-10 DIAGNOSIS — Z79899 Other long term (current) drug therapy: Secondary | ICD-10-CM | POA: Insufficient documentation

## 2017-11-10 DIAGNOSIS — M47817 Spondylosis without myelopathy or radiculopathy, lumbosacral region: Secondary | ICD-10-CM | POA: Diagnosis not present

## 2017-11-10 DIAGNOSIS — G8929 Other chronic pain: Secondary | ICD-10-CM

## 2017-11-10 MED ORDER — LACTATED RINGERS IV SOLN
1000.0000 mL | INTRAVENOUS | Status: DC
  Administered 2017-11-10: 1000 mL via INTRAVENOUS

## 2017-11-10 MED ORDER — HYDROCODONE-ACETAMINOPHEN 5-325 MG PO TABS: 1.0000 | ORAL_TABLET | Freq: Two times a day (BID) | ORAL | 0 refills | Status: DC

## 2017-11-10 MED ORDER — SODIUM CHLORIDE 0.9% FLUSH
10.0000 mL | Freq: Once | INTRAVENOUS | Status: AC
  Administered 2017-11-10: 10 mL

## 2017-11-10 MED ORDER — ROPIVACAINE HCL 2 MG/ML IJ SOLN
10.0000 mL | Freq: Once | INTRAMUSCULAR | Status: AC
  Administered 2017-11-10: 10 mL via EPIDURAL
  Filled 2017-11-10: qty 10

## 2017-11-10 MED ORDER — LIDOCAINE HCL (PF) 1 % IJ SOLN
5.0000 mL | Freq: Once | INTRAMUSCULAR | Status: AC
  Administered 2017-11-10: 5 mL via SUBCUTANEOUS
  Filled 2017-11-10: qty 5

## 2017-11-10 MED ORDER — TRIAMCINOLONE ACETONIDE 40 MG/ML IJ SUSP
40.0000 mg | Freq: Once | INTRAMUSCULAR | Status: AC
  Administered 2017-11-10: 40 mg
  Filled 2017-11-10: qty 1

## 2017-11-10 MED ORDER — MIDAZOLAM HCL 5 MG/5ML IJ SOLN
5.0000 mg | Freq: Once | INTRAMUSCULAR | Status: AC
  Administered 2017-11-10: 4 mg via INTRAVENOUS
  Filled 2017-11-10: qty 5

## 2017-11-10 MED ORDER — IOPAMIDOL (ISOVUE-M 200) INJECTION 41%
INTRAMUSCULAR | Status: AC
Start: 2017-11-10 — End: ?
  Filled 2017-11-10: qty 10

## 2017-11-10 MED ORDER — HYDROCODONE-ACETAMINOPHEN 5-325 MG PO TABS
1.0000 | ORAL_TABLET | Freq: Two times a day (BID) | ORAL | 0 refills | Status: DC
Start: 2017-11-10 — End: 2018-01-16

## 2017-11-10 MED ORDER — DEXAMETHASONE SODIUM PHOSPHATE 10 MG/ML IJ SOLN
10.0000 mg | Freq: Once | INTRAMUSCULAR | Status: AC
  Administered 2017-11-10: 10 mg
  Filled 2017-11-10: qty 1

## 2017-11-10 MED ORDER — SODIUM CHLORIDE 0.9 % IJ SOLN
INTRAMUSCULAR | Status: AC
  Filled 2017-11-10: qty 10

## 2017-11-10 NOTE — Progress Notes (Signed)
Nursing Pain Medication Assessment:  Safety precautions to be maintained throughout the outpatient stay will include: orient to surroundings, keep bed in low position, maintain call bell within reach at all times, provide assistance with transfer out of bed and ambulation.  Medication Inspection Compliance: Pill count conducted under aseptic conditions, in front of the patient. Neither the pills nor the bottle was removed from the patient's sight at any time. Once count was completed pills were immediately returned to the patient in their original bottle.  Medication: Hydrocodone/APAP Pill/Patch Count: 31 of 45 pills remain Pill/Patch Appearance: Markings consistent with prescribed medication Bottle Appearance: Standard pharmacy container. Clearly labeled. Filled Date: 5 / 28 / 2019 Last Medication intake:  Yesterday

## 2017-11-10 NOTE — Patient Instructions (Signed)

## 2017-11-12 NOTE — Progress Notes (Signed)
Subjective:  Patient ID: Katherine Lester, female    DOB: Oct 24, 1955  Age: 62 y.o. MRN: [id number]  CC: Back Pain and Leg Pain   Procedure: L5-S1 epidural steroid under fluoroscopic guidance with moderate sedation bilateral trigger point injection lumbar paraspinous L5  HPI Katherine Lester presents for evaluation.  She was last seen 2 months ago and had an epidural injection back in April.  She received good relief with that and describes approximately 75% improvement.  She is still having some primarily left greater than right low back pain and leg pain.  She generally gets approximately 5 to 6 weeks of excellent relief with then some gradual recurrence of the same quality characteristic and distribution of pain.  Unfortunately she has failed conservative therapy with stretching strengthening exercise and medication management alone.  Conservative medications have given her minimal relief.  She is currently taking Vicodin approximately 1 to 2 tablets/day averaging 45 tablets/month and this seems to be working well for her.  The quality characteristic and distribution of her pain are otherwise unchanged with no change in lower extremity strength or function or bowel bladder function.  Outpatient Medications Prior to Visit  Medication Sig Dispense Refill  . busPIRone (BUSPAR) 10 MG tablet Take 10 mg by mouth 3 (three) times daily.    . cetirizine (ZYRTEC) 10 MG tablet Take 10 mg by mouth.    . Cholecalciferol (VITAMIN D3) 10000 UNITS capsule Take 50,000 Units by mouth once a week.     . cyclobenzaprine (FLEXERIL) 10 MG tablet Limit 1 tab by mouth 2-4 times a day if tolerated 100 tablet 0  . DULoxetine (CYMBALTA) 60 MG capsule     . ondansetron (ZOFRAN-ODT) 4 MG disintegrating tablet Take 4 mg by mouth every 8 (eight) hours as needed for nausea or vomiting.    . polyethylene glycol (MIRALAX / GLYCOLAX) packet     . zolpidem (AMBIEN) 5 MG tablet Take 5 mg by mouth at bedtime.    Marland Kitchen  HYDROcodone-acetaminophen (NORCO/VICODIN) 5-325 MG tablet Take 1 tablet by mouth 2 (two) times daily. 45 tablet 0  . DULoxetine (CYMBALTA) 20 MG capsule Limit 1 capsule by mouth per day if tolerated (Patient not taking: Reported on 11/10/2017) 30 capsule 0  . levofloxacin (LEVAQUIN) 500 MG tablet Take 1 tablet by mouth daily.    . pantoprazole (PROTONIX) 40 MG tablet Take 40 mg by mouth daily.     . pantoprazole (PROTONIX) 40 MG tablet      Facility-Administered Medications Prior to Visit  Medication Dose Route Frequency Provider Last Rate Last Dose  . bupivacaine (PF) (MARCAINE) 0.25 % injection 30 mL  30 mL Other Once Mohammed Kindle, MD      . bupivacaine (PF) (MARCAINE) 0.25 % injection 30 mL  30 mL Other Once Mohammed Kindle, MD      . bupivacaine (PF) (MARCAINE) 0.25 % injection 30 mL  30 mL Other Once Mohammed Kindle, MD      . bupivacaine (PF) (MARCAINE) 0.25 % injection 30 mL  30 mL Other Once Mohammed Kindle, MD      . fentaNYL (SUBLIMAZE) injection 100 mcg  100 mcg Intravenous Once Mohammed Kindle, MD      . fentaNYL (SUBLIMAZE) injection 100 mcg  100 mcg Intravenous Once Mohammed Kindle, MD      . lactated ringers infusion 1,000 mL  1,000 mL Intravenous Continuous Mohammed Kindle, MD      . lactated ringers infusion 1,000 mL  1,000 mL Intravenous  Continuous Mohammed Kindle, MD      . lactated ringers infusion 1,000 mL  1,000 mL Intravenous Continuous Mohammed Kindle, MD 125 mL/hr at 10/15/15 0923 1,000 mL at 10/15/15 0923  . lactated ringers infusion 1,000 mL  1,000 mL Intravenous Continuous Mohammed Kindle, MD 125 mL/hr at 01/12/16 0950 1,000 mL at 01/12/16 0950  . midazolam (VERSED) 5 MG/5ML injection 5 mg  5 mg Intravenous Once Mohammed Kindle, MD      . midazolam (VERSED) 5 MG/5ML injection 5 mg  5 mg Intravenous Once Mohammed Kindle, MD      . orphenadrine (NORFLEX) injection 60 mg  60 mg Intramuscular Once Mohammed Kindle, MD      . orphenadrine (NORFLEX) injection 60 mg  60 mg Intramuscular  Once Mohammed Kindle, MD      . orphenadrine (NORFLEX) injection 60 mg  60 mg Intramuscular Once Mohammed Kindle, MD      . orphenadrine (NORFLEX) injection 60 mg  60 mg Intramuscular Once Mohammed Kindle, MD      . sodium chloride flush (NS) 0.9 % injection 20 mL  20 mL Other Once Mohammed Kindle, MD      . sodium chloride flush (NS) 0.9 % injection 20 mL  20 mL Other Once Mohammed Kindle, MD      . triamcinolone acetonide (KENALOG-40) injection 40 mg  40 mg Other Once Mohammed Kindle, MD      . triamcinolone acetonide (KENALOG-40) injection 40 mg  40 mg Other Once Mohammed Kindle, MD      . triamcinolone acetonide (KENALOG-40) injection 40 mg  40 mg Other Once Mohammed Kindle, MD      . triamcinolone acetonide (KENALOG-40) injection 40 mg  40 mg Other Once Mohammed Kindle, MD      . triamcinolone acetonide (KENALOG-40) injection 40 mg  40 mg Other Once Mohammed Kindle, MD        Review of Systems CNS: No confusion or sedation Cardiac: No angina or palpitations GI: No abdominal pain or constipation Constitutional: No nausea vomiting fevers or chills  Objective:  BP 125/84   Pulse 82   Temp 98 F (36.7 C)   Resp 14   Ht 5\' 5"  (1.651 m)   Wt 175 lb (79.4 kg)   SpO2 100%   BMI 29.12 kg/m    BP Readings from Last 3 Encounters:  11/10/17 125/84  09/22/17 122/78  06/30/17 (!) 156/84     Wt Readings from Last 3 Encounters:  11/10/17 175 lb (79.4 kg)  09/22/17 170 lb (77.1 kg)  06/30/17 178 lb (80.7 kg)     Physical Exam Pt is alert and oriented PERRL EOMI HEART IS RRR no murmur or rub LCTA no wheezing or rales MUSCULOSKELETAL reveals 2 trigger points approximately L5 bilateral lumbar region.  She has a positive straight leg raise on the left side.  Less so on the right in the L5 distribution.  Her muscle tone and bulk is at baseline with good strength.  Labs  No results found for: HGBA1C Lab Results  Component Value Date   CREATININE 0.72 03/04/2017     -------------------------------------------------------------------------------------------------------------------- Lab Results  Component Value Date   WBC 12.0 (H) 03/04/2017   HGB 15.3 03/04/2017   HCT 44.2 03/04/2017   PLT 356 03/04/2017   GLUCOSE 114 (H) 03/04/2017   NA 139 03/04/2017   K 3.5 03/04/2017   CL 103 03/04/2017   CREATININE 0.72 03/04/2017   BUN 12 03/04/2017   CO2 26 03/04/2017  TSH 0.590 03/04/2017    --------------------------------------------------------------------------------------------------------------------- Dg C-arm 1-60 Min-no Report  Result Date: 11/10/2017 Fluoroscopy was utilized by the requesting physician.  No radiographic interpretation.     Assessment & Plan:   Imaan was seen today for back pain and leg pain.  Diagnoses and all orders for this visit:  Chronic bilateral low back pain with bilateral sciatica -     Lumbar Epidural Injection; Future  Degenerative disc disease, lumbar -     Lumbar Epidural Injection; Future  Chronic, continuous use of opioids  Lumbosacral spondylosis without myelopathy -     Lumbar Epidural Injection; Future  Acute bilateral low back pain with bilateral sciatica  Chronic pain syndrome  DDD (degenerative disc disease), lumbar  Chronic bilateral low back pain without sciatica  Other orders -     Discontinue: HYDROcodone-acetaminophen (NORCO/VICODIN) 5-325 MG tablet; Take 1 tablet by mouth 2 (two) times daily. -     HYDROcodone-acetaminophen (NORCO/VICODIN) 5-325 MG tablet; Take 1 tablet by mouth 2 (two) times daily. -     triamcinolone acetonide (KENALOG-40) injection 40 mg -     sodium chloride flush (NS) 0.9 % injection 10 mL -     ropivacaine (PF) 2 mg/mL (0.2%) (NAROPIN) injection 10 mL -     midazolam (VERSED) 5 MG/5ML injection 5 mg -     lidocaine (PF) (XYLOCAINE) 1 % injection 5 mL -     lactated ringers infusion 1,000 mL -     dexamethasone (DECADRON) injection 10  mg        ----------------------------------------------------------------------------------------------------------------------  Problem List Items Addressed This Visit      Unprioritized   Degenerative disc disease, lumbar   Relevant Medications   HYDROcodone-acetaminophen (NORCO/VICODIN) 5-325 MG tablet   triamcinolone acetonide (KENALOG-40) injection 40 mg (Completed)   dexamethasone (DECADRON) injection 10 mg (Completed)   Other Relevant Orders   Lumbar Epidural Injection    Other Visit Diagnoses    Chronic bilateral low back pain with bilateral sciatica    -  Primary   Relevant Medications   DULoxetine (CYMBALTA) 60 MG capsule   HYDROcodone-acetaminophen (NORCO/VICODIN) 5-325 MG tablet   triamcinolone acetonide (KENALOG-40) injection 40 mg (Completed)   midazolam (VERSED) 5 MG/5ML injection 5 mg (Completed)   dexamethasone (DECADRON) injection 10 mg (Completed)   Other Relevant Orders   Lumbar Epidural Injection   Chronic, continuous use of opioids       Lumbosacral spondylosis without myelopathy       Relevant Medications   HYDROcodone-acetaminophen (NORCO/VICODIN) 5-325 MG tablet   triamcinolone acetonide (KENALOG-40) injection 40 mg (Completed)   dexamethasone (DECADRON) injection 10 mg (Completed)   Other Relevant Orders   Lumbar Epidural Injection   Acute bilateral low back pain with bilateral sciatica       Relevant Medications   HYDROcodone-acetaminophen (NORCO/VICODIN) 5-325 MG tablet   triamcinolone acetonide (KENALOG-40) injection 40 mg (Completed)   dexamethasone (DECADRON) injection 10 mg (Completed)   Chronic pain syndrome       DDD (degenerative disc disease), lumbar       Relevant Medications   HYDROcodone-acetaminophen (NORCO/VICODIN) 5-325 MG tablet   triamcinolone acetonide (KENALOG-40) injection 40 mg (Completed)   dexamethasone (DECADRON) injection 10 mg (Completed)   Chronic bilateral low back pain without sciatica       Relevant Medications    HYDROcodone-acetaminophen (NORCO/VICODIN) 5-325 MG tablet   triamcinolone acetonide (KENALOG-40) injection 40 mg (Completed)   dexamethasone (DECADRON) injection 10 mg (Completed)        ----------------------------------------------------------------------------------------------------------------------  1. Chronic bilateral low back pain with bilateral sciatica We will proceed with a repeat epidural injection today.  She is had previous epidurals in January and April of this year.  She generally receives an epidural approximately every 2 to 3 months which keeps her pain under good control.  I have encouraged her to continue with stretching strengthening exercises as I do not believe she is actively pursuing this as previously requested. - Lumbar Epidural Injection; Future  2. Degenerative disc disease, lumbar As above.  Have reviewed her August 2015 MRI once again.  It does not appear that she has had any changes in the characteristic of her pain from that previous examination I do not feel that a repeat examination is indicated.  Her strength is also remained stable. - Lumbar Epidural Injection; Future  3. Chronic, continuous use of opioids I reviewed the [facility] practitioner database information and it is appropriate.  We will give her a refill medications for June 27 and July 27.  4. Lumbosacral spondylosis without myelopathy As above - Lumbar Epidural Injection; Future  5. Acute bilateral low back pain with bilateral sciatica As above  6. Chronic pain syndrome   7. DDD (degenerative disc disease), lumbar   8. Chronic bilateral low back pain without sciatica     ----------------------------------------------------------------------------------------------------------------------  I am having Lylliana A. Lovena Le maintain her Vitamin D3, zolpidem, cyclobenzaprine, DULoxetine, pantoprazole, cetirizine, polyethylene glycol, levofloxacin, ondansetron, busPIRone,  DULoxetine, pantoprazole, and HYDROcodone-acetaminophen. We administered triamcinolone acetonide, sodium chloride flush, ropivacaine (PF) 2 mg/mL (0.2%), midazolam, lidocaine (PF), lactated ringers, and dexamethasone. We will continue to administer bupivacaine (PF), fentaNYL, lactated ringers, midazolam, orphenadrine, triamcinolone acetonide, bupivacaine (PF), fentaNYL, lactated ringers, midazolam, orphenadrine, triamcinolone acetonide, bupivacaine (PF), orphenadrine, triamcinolone acetonide, lactated ringers, sodium chloride flush, triamcinolone acetonide, lactated ringers, bupivacaine (PF), orphenadrine, triamcinolone acetonide, and sodium chloride flush.   Meds ordered this encounter  Medications  . DISCONTD: HYDROcodone-acetaminophen (NORCO/VICODIN) 5-325 MG tablet    Sig: Take 1 tablet by mouth 2 (two) times daily.    Dispense:  45 tablet    Refill:  0    Do not fill until [id number]  . HYDROcodone-acetaminophen (NORCO/VICODIN) 5-325 MG tablet    Sig: Take 1 tablet by mouth 2 (two) times daily.    Dispense:  45 tablet    Refill:  0    Do not fill until [id number]  . triamcinolone acetonide (KENALOG-40) injection 40 mg  . sodium chloride flush (NS) 0.9 % injection 10 mL  . ropivacaine (PF) 2 mg/mL (0.2%) (NAROPIN) injection 10 mL  . midazolam (VERSED) 5 MG/5ML injection 5 mg  . lidocaine (PF) (XYLOCAINE) 1 % injection 5 mL  . lactated ringers infusion 1,000 mL  . dexamethasone (DECADRON) injection 10 mg   Patient's Medications  New Prescriptions   No medications on file  Previous Medications   BUSPIRONE (BUSPAR) 10 MG TABLET    Take 10 mg by mouth 3 (three) times daily.   CETIRIZINE (ZYRTEC) 10 MG TABLET    Take 10 mg by mouth.   CHOLECALCIFEROL (VITAMIN D3) 10000 UNITS CAPSULE    Take 50,000 Units by mouth once a week.    CYCLOBENZAPRINE (FLEXERIL) 10 MG TABLET    Limit 1 tab by mouth 2-4 times a day if tolerated   DULOXETINE (CYMBALTA) 20 MG CAPSULE    Limit 1 capsule by mouth per  day if tolerated   DULOXETINE (CYMBALTA) 60 MG CAPSULE       LEVOFLOXACIN (LEVAQUIN) 500 MG TABLET  Take 1 tablet by mouth daily.   ONDANSETRON (ZOFRAN-ODT) 4 MG DISINTEGRATING TABLET    Take 4 mg by mouth every 8 (eight) hours as needed for nausea or vomiting.   PANTOPRAZOLE (PROTONIX) 40 MG TABLET    Take 40 mg by mouth daily.    PANTOPRAZOLE (PROTONIX) 40 MG TABLET       POLYETHYLENE GLYCOL (MIRALAX / GLYCOLAX) PACKET       ZOLPIDEM (AMBIEN) 5 MG TABLET    Take 5 mg by mouth at bedtime.  Modified Medications   Modified Medication Previous Medication   HYDROCODONE-ACETAMINOPHEN (NORCO/VICODIN) 5-325 MG TABLET HYDROcodone-acetaminophen (NORCO/VICODIN) 5-325 MG tablet      Take 1 tablet by mouth 2 (two) times daily.    Take 1 tablet by mouth 2 (two) times daily.  Discontinued Medications   No medications on file   ----------------------------------------------------------------------------------------------------------------------  Follow-up: Return for evaluation, procedure.   Procedure: L5-S1 LESI with fluoroscopic guidance and moderate sedation  NOTE: The risks, benefits, and expectations of the procedure have been discussed and explained to the patient who was understanding and in agreement with suggested treatment plan. No guarantees were made.  DESCRIPTION OF PROCEDURE: Lumbar epidural steroid injection with 4 IV Versed, EKG, blood pressure, pulse, and pulse oximetry monitoring. The procedure was performed with the patient in the prone position under fluoroscopic guidance.  Sterile prep x3 was initiated and I then injected subcutaneous lidocaine to the overlying 5 S1 site after its fluoroscopic identifictation.  Using strict aseptic technique, I then advanced an 18-gauge Tuohy epidural needle in the midline using interlaminar approach via loss-of-resistance to saline technique. There was negative aspiration for heme or  CSF.  I then confirmed position with both AP and Lateral  fluoroscan.  2 cc of Isovue were injected and a  total of 5 mL of Preservative-Free normal saline mixed with 40 mg of Kenalog and 1cc Ropicaine 0.2 percent were injected incrementally via the  epidurally placed needle. The needle was removed. The patient tolerated the injection well and was convalesced and discharged to home in stable condition. Should the patient have any post procedure difficulty they have been instructed on how to contact us for assistance.   Trigger point injection: The area overlying the aforementioned trigger points were prepped with alcohol. They were then injected with a 25-gauge needle with 4 cc of ropivacaine 0.2% and Decadron 4 mg at each site after negative aspiration for heme. This was performed after informed consent was obtained and risks and benefits reviewed. She tolerated this procedure without difficulty and was convalesced and discharged to home in stable condition for follow-up as mentioned.  @Ellouise Mcwhirter  Andree Elk, MD@   Molli Barrows, MD

## 2018-01-16 ENCOUNTER — Other Ambulatory Visit: Payer: Self-pay

## 2018-01-16 ENCOUNTER — Ambulatory Visit (HOSPITAL_BASED_OUTPATIENT_CLINIC_OR_DEPARTMENT_OTHER): Payer: BC Managed Care – PPO | Admitting: Anesthesiology

## 2018-01-16 ENCOUNTER — Encounter: Payer: Self-pay | Admitting: Anesthesiology

## 2018-01-16 ENCOUNTER — Other Ambulatory Visit: Payer: Self-pay | Admitting: Anesthesiology

## 2018-01-16 ENCOUNTER — Ambulatory Visit
Admission: RE | Admit: 2018-01-16 | Discharge: 2018-01-16 | Disposition: A | Discharge status: Home / Self Care | Payer: BC Managed Care – PPO | Source: Ambulatory Visit | Attending: Anesthesiology | Admitting: Anesthesiology

## 2018-01-16 VITALS — BP 115/87 | Temp 98.2°F | Resp 16 | Ht 66.0 in | Wt 175.0 lb

## 2018-01-16 DIAGNOSIS — R52 Pain, unspecified: Secondary | ICD-10-CM

## 2018-01-16 DIAGNOSIS — M5441 Lumbago with sciatica, right side: Secondary | ICD-10-CM | POA: Insufficient documentation

## 2018-01-16 DIAGNOSIS — Z79891 Long term (current) use of opiate analgesic: Secondary | ICD-10-CM | POA: Insufficient documentation

## 2018-01-16 DIAGNOSIS — G894 Chronic pain syndrome: Secondary | ICD-10-CM | POA: Insufficient documentation

## 2018-01-16 DIAGNOSIS — M5442 Lumbago with sciatica, left side: Secondary | ICD-10-CM | POA: Diagnosis not present

## 2018-01-16 DIAGNOSIS — F1190 Opioid use, unspecified, uncomplicated: Secondary | ICD-10-CM

## 2018-01-16 DIAGNOSIS — M5136 Other intervertebral disc degeneration, lumbar region: Secondary | ICD-10-CM

## 2018-01-16 DIAGNOSIS — Z79899 Other long term (current) drug therapy: Secondary | ICD-10-CM | POA: Insufficient documentation

## 2018-01-16 DIAGNOSIS — M545 Low back pain: Secondary | ICD-10-CM | POA: Diagnosis present

## 2018-01-16 DIAGNOSIS — M47816 Spondylosis without myelopathy or radiculopathy, lumbar region: Secondary | ICD-10-CM

## 2018-01-16 DIAGNOSIS — M79606 Pain in leg, unspecified: Secondary | ICD-10-CM | POA: Diagnosis not present

## 2018-01-16 DIAGNOSIS — M47817 Spondylosis without myelopathy or radiculopathy, lumbosacral region: Secondary | ICD-10-CM

## 2018-01-16 DIAGNOSIS — G8929 Other chronic pain: Secondary | ICD-10-CM

## 2018-01-16 MED ORDER — IOPAMIDOL (ISOVUE-M 200) INJECTION 41%
INTRAMUSCULAR | Status: AC
  Filled 2018-01-16: qty 10

## 2018-01-16 MED ORDER — ROPIVACAINE HCL 2 MG/ML IJ SOLN
INTRAMUSCULAR | Status: AC
Start: 2018-01-16 — End: 2018-01-16
  Filled 2018-01-16: qty 10

## 2018-01-16 MED ORDER — HYDROCODONE-ACETAMINOPHEN 5-325 MG PO TABS: 1.0000 | ORAL_TABLET | Freq: Two times a day (BID) | ORAL | 0 refills | Status: DC

## 2018-01-16 MED ORDER — TRIAMCINOLONE ACETONIDE 40 MG/ML IJ SUSP
40.0000 mg | Freq: Once | INTRAMUSCULAR | Status: AC
  Administered 2018-01-16: 40 mg

## 2018-01-16 MED ORDER — LIDOCAINE HCL (PF) 1 % IJ SOLN
5.0000 mL | Freq: Once | INTRAMUSCULAR | Status: AC
  Administered 2018-01-16: 5 mL via SUBCUTANEOUS

## 2018-01-16 MED ORDER — MIDAZOLAM HCL 5 MG/5ML IJ SOLN
INTRAMUSCULAR | Status: AC
  Filled 2018-01-16: qty 5

## 2018-01-16 MED ORDER — LIDOCAINE HCL (PF) 1 % IJ SOLN
INTRAMUSCULAR | Status: AC
  Filled 2018-01-16: qty 5

## 2018-01-16 MED ORDER — SODIUM CHLORIDE 0.9 % IJ SOLN
INTRAMUSCULAR | Status: AC
Start: 2018-01-16 — End: 2018-01-16
  Filled 2018-01-16: qty 10

## 2018-01-16 MED ORDER — SODIUM CHLORIDE 0.9% FLUSH
10.0000 mL | Freq: Once | INTRAVENOUS | Status: AC
  Administered 2018-01-16: 5 mL

## 2018-01-16 MED ORDER — LACTATED RINGERS IV SOLN
1000.0000 mL | INTRAVENOUS | Status: DC
  Administered 2018-01-16: 1000 mL via INTRAVENOUS

## 2018-01-16 MED ORDER — TRIAMCINOLONE ACETONIDE 40 MG/ML IJ SUSP
INTRAMUSCULAR | Status: AC
  Filled 2018-01-16: qty 1

## 2018-01-16 MED ORDER — DEXAMETHASONE SODIUM PHOSPHATE 10 MG/ML IJ SOLN
4.0000 mg | Freq: Once | INTRAMUSCULAR | Status: AC
  Administered 2018-01-16: 10 mg
  Filled 2018-01-16: qty 1

## 2018-01-16 MED ORDER — ROPIVACAINE HCL 2 MG/ML IJ SOLN
10.0000 mL | Freq: Once | INTRAMUSCULAR | Status: AC
  Administered 2018-01-16: 10 mL via EPIDURAL
  Filled 2018-01-16: qty 10

## 2018-01-16 MED ORDER — IOPAMIDOL (ISOVUE-M 200) INJECTION 41%
20.0000 mL | Freq: Once | INTRAMUSCULAR | Status: DC | PRN
  Administered 2018-01-16: 10 mL
  Filled 2018-01-16: qty 20

## 2018-01-16 MED ORDER — MIDAZOLAM HCL 5 MG/5ML IJ SOLN
5.0000 mg | Freq: Once | INTRAMUSCULAR | Status: AC
  Administered 2018-01-16: 3 mg via INTRAVENOUS

## 2018-01-16 NOTE — Progress Notes (Signed)
Nursing Pain Medication Assessment:  Safety precautions to be maintained throughout the outpatient stay will include: orient to surroundings, keep bed in low position, maintain call bell within reach at all times, provide assistance with transfer out of bed and ambulation.  Medication Inspection Compliance: Pill count conducted under aseptic conditions, in front of the patient. Neither the pills nor the bottle was removed from the patient's sight at any time. Once count was completed pills were immediately returned to the patient in their original bottle.  Medication: Hydrocodone/APAP Pill/Patch Count: 1 of 45 pills remain Pill/Patch Appearance: Markings consistent with prescribed medication Bottle Appearance: Standard pharmacy container. Clearly labeled. Filled Date:07 / 01/ 2019 Last Medication intake:  Yesterday

## 2018-01-16 NOTE — Patient Instructions (Signed)

## 2018-01-17 ENCOUNTER — Telehealth: Payer: Self-pay | Admitting: *Deleted

## 2018-01-17 NOTE — Progress Notes (Signed)
Subjective:  Patient ID: Katherine Lester, female    DOB: 1955-08-24  Age: 62 y.o. MRN: [id number]  CC: Back Pain (low)   Procedure: L5-S1 epidural steroid under fluoroscopic guidance with moderate sedation and bilateral SI posterior trigger point injections x2  HPI Katherine Lester presents for a return evaluation.  She was last seen in June and had an epidural steroid at that time.  She reports a 50% reduction in her low back pain and 75% reduction in her lower extremity pain.  She has had some recurrence of the same typical low back muscle spasm but overall this also has improved.  She still getting some radiating pain into the right posterior calf but no change in bowel or bladder function overall her medications are working better.  She is taking these as prescribed with no side effects noted.  Based on her narcotic assessment sheet she continues to derive good functional lifestyle improvement with her medications.  She is trying to do some stretching exercises but admits that she is not disciplined in this regard.  Otherwise she is in her usual state of health at this point.  Outpatient Medications Prior to Visit  Medication Sig Dispense Refill  . busPIRone (BUSPAR) 10 MG tablet Take 10 mg by mouth 3 (three) times daily.    . cetirizine (ZYRTEC) 10 MG tablet Take 10 mg by mouth.    . Cholecalciferol (VITAMIN D3) 10000 UNITS capsule Take 50,000 Units by mouth once a week.     . DULoxetine (CYMBALTA) 60 MG capsule Take 60 mg by mouth daily.     . pantoprazole (PROTONIX) 40 MG tablet     . polyethylene glycol (MIRALAX / GLYCOLAX) packet     . zolpidem (AMBIEN) 5 MG tablet Take 5 mg by mouth at bedtime.    Marland Kitchen HYDROcodone-acetaminophen (NORCO/VICODIN) 5-325 MG tablet Take 1 tablet by mouth 2 (two) times daily. 45 tablet 0  . cyclobenzaprine (FLEXERIL) 10 MG tablet Limit 1 tab by mouth 2-4 times a day if tolerated (Patient not taking: Reported on 01/16/2018) 100 tablet 0  . DULoxetine  (CYMBALTA) 20 MG capsule Limit 1 capsule by mouth per day if tolerated (Patient not taking: Reported on 01/16/2018) 30 capsule 0  . levofloxacin (LEVAQUIN) 500 MG tablet Take 1 tablet by mouth daily.    . ondansetron (ZOFRAN-ODT) 4 MG disintegrating tablet Take 4 mg by mouth every 8 (eight) hours as needed for nausea or vomiting.    . pantoprazole (PROTONIX) 40 MG tablet Take 40 mg by mouth daily.     . Vitamin D, Ergocalciferol, (DRISDOL) 50000 units CAPS capsule      Facility-Administered Medications Prior to Visit  Medication Dose Route Frequency Provider Last Rate Last Dose  . bupivacaine (PF) (MARCAINE) 0.25 % injection 30 mL  30 mL Other Once Mohammed Kindle, MD      . bupivacaine (PF) (MARCAINE) 0.25 % injection 30 mL  30 mL Other Once Mohammed Kindle, MD      . bupivacaine (PF) (MARCAINE) 0.25 % injection 30 mL  30 mL Other Once Mohammed Kindle, MD      . bupivacaine (PF) (MARCAINE) 0.25 % injection 30 mL  30 mL Other Once Mohammed Kindle, MD      . fentaNYL (SUBLIMAZE) injection 100 mcg  100 mcg Intravenous Once Mohammed Kindle, MD      . fentaNYL (SUBLIMAZE) injection 100 mcg  100 mcg Intravenous Once Mohammed Kindle, MD      . lactated ringers  infusion 1,000 mL  1,000 mL Intravenous Continuous Mohammed Kindle, MD      . lactated ringers infusion 1,000 mL  1,000 mL Intravenous Continuous Mohammed Kindle, MD      . lactated ringers infusion 1,000 mL  1,000 mL Intravenous Continuous Mohammed Kindle, MD 125 mL/hr at 10/15/15 0923 1,000 mL at 10/15/15 0923  . lactated ringers infusion 1,000 mL  1,000 mL Intravenous Continuous Mohammed Kindle, MD 125 mL/hr at 01/12/16 0950 1,000 mL at 01/12/16 0950  . midazolam (VERSED) 5 MG/5ML injection 5 mg  5 mg Intravenous Once Mohammed Kindle, MD      . midazolam (VERSED) 5 MG/5ML injection 5 mg  5 mg Intravenous Once Mohammed Kindle, MD      . orphenadrine (NORFLEX) injection 60 mg  60 mg Intramuscular Once Mohammed Kindle, MD      . orphenadrine (NORFLEX)  injection 60 mg  60 mg Intramuscular Once Mohammed Kindle, MD      . orphenadrine (NORFLEX) injection 60 mg  60 mg Intramuscular Once Mohammed Kindle, MD      . orphenadrine (NORFLEX) injection 60 mg  60 mg Intramuscular Once Mohammed Kindle, MD      . sodium chloride flush (NS) 0.9 % injection 20 mL  20 mL Other Once Mohammed Kindle, MD      . sodium chloride flush (NS) 0.9 % injection 20 mL  20 mL Other Once Mohammed Kindle, MD      . triamcinolone acetonide (KENALOG-40) injection 40 mg  40 mg Other Once Mohammed Kindle, MD      . triamcinolone acetonide (KENALOG-40) injection 40 mg  40 mg Other Once Mohammed Kindle, MD      . triamcinolone acetonide (KENALOG-40) injection 40 mg  40 mg Other Once Mohammed Kindle, MD      . triamcinolone acetonide (KENALOG-40) injection 40 mg  40 mg Other Once Mohammed Kindle, MD      . triamcinolone acetonide (KENALOG-40) injection 40 mg  40 mg Other Once Mohammed Kindle, MD        Review of Systems CNS: No confusion or sedation Cardiac: No angina or palpitations GI: No abdominal pain or constipation Posterior: No nausea vomiting fevers or chills  Objective:  BP 115/87   Temp 98.2 F (36.8 C) (Temporal)   Resp 16   Ht 5\' 6"  (1.676 m)   Wt 175 lb (79.4 kg)   SpO2 99%   BMI 28.25 kg/m    BP Readings from Last 3 Encounters:  01/16/18 115/87  11/10/17 125/84  09/22/17 122/78     Wt Readings from Last 3 Encounters:  01/16/18 175 lb (79.4 kg)  11/10/17 175 lb (79.4 kg)  09/22/17 170 lb (77.1 kg)     Physical Exam Pt is alert and oriented PERRL EOMI HEART IS RRR no murmur or rub LCTA no wheezing or rales MUSCULOSKELETAL reveals 2 trigger points of the overlying the bilateral SI joints.  She has a positive straight leg raise on the right side somewhat equivocal on the left her muscle tone and bulk is at baseline she is ambulating well  Labs  No results found for: HGBA1C Lab Results  Component Value Date   CREATININE 0.72 03/04/2017     -------------------------------------------------------------------------------------------------------------------- Lab Results  Component Value Date   WBC 12.0 (H) 03/04/2017   HGB 15.3 03/04/2017   HCT 44.2 03/04/2017   PLT 356 03/04/2017   GLUCOSE 114 (H) 03/04/2017   NA 139 03/04/2017   K 3.5 03/04/2017   CL  103 03/04/2017   CREATININE 0.72 03/04/2017   BUN 12 03/04/2017   CO2 26 03/04/2017   TSH 0.590 03/04/2017    --------------------------------------------------------------------------------------------------------------------- Dg C-arm 1-60 Min-no Report  Result Date: 01/16/2018 Fluoroscopy was utilized by the requesting physician.  No radiographic interpretation.     Assessment & Plan:   Katherine Lester was seen today for back pain.  Diagnoses and all orders for this visit:  Chronic pain syndrome  Chronic bilateral low back pain with bilateral sciatica -     Lumbar Epidural Injection -     Lumbar Epidural Injection  Degenerative disc disease, lumbar -     Lumbar Epidural Injection  Lumbosacral spondylosis without myelopathy -     Lumbar Epidural Injection  Chronic, continuous use of opioids  Facet syndrome, lumbar  Other orders -     triamcinolone acetonide (KENALOG-40) injection 40 mg -     sodium chloride flush (NS) 0.9 % injection 10 mL -     ropivacaine (PF) 2 mg/mL (0.2%) (NAROPIN) injection 10 mL -     midazolam (VERSED) 5 MG/5ML injection 5 mg -     lidocaine (PF) (XYLOCAINE) 1 % injection 5 mL -     lactated ringers infusion 1,000 mL -     iopamidol (ISOVUE-M) 41 % intrathecal injection 20 mL -     dexamethasone (DECADRON) injection 4 mg -     HYDROcodone-acetaminophen (NORCO/VICODIN) 5-325 MG tablet; Take 1 tablet by mouth 2 (two) times daily.        ----------------------------------------------------------------------------------------------------------------------  Problem List Items Addressed This Visit      Unprioritized    Degenerative disc disease, lumbar   Relevant Medications   triamcinolone acetonide (KENALOG-40) injection 40 mg (Completed)   dexamethasone (DECADRON) injection 4 mg (Completed)   HYDROcodone-acetaminophen (NORCO/VICODIN) 5-325 MG tablet   Facet syndrome, lumbar   Relevant Medications   triamcinolone acetonide (KENALOG-40) injection 40 mg (Completed)   dexamethasone (DECADRON) injection 4 mg (Completed)   HYDROcodone-acetaminophen (NORCO/VICODIN) 5-325 MG tablet    Other Visit Diagnoses    Chronic pain syndrome    -  Primary   Chronic bilateral low back pain with bilateral sciatica       Relevant Medications   triamcinolone acetonide (KENALOG-40) injection 40 mg (Completed)   midazolam (VERSED) 5 MG/5ML injection 5 mg (Completed)   dexamethasone (DECADRON) injection 4 mg (Completed)   HYDROcodone-acetaminophen (NORCO/VICODIN) 5-325 MG tablet   Lumbosacral spondylosis without myelopathy       Relevant Medications   triamcinolone acetonide (KENALOG-40) injection 40 mg (Completed)   dexamethasone (DECADRON) injection 4 mg (Completed)   HYDROcodone-acetaminophen (NORCO/VICODIN) 5-325 MG tablet   Chronic, continuous use of opioids            ----------------------------------------------------------------------------------------------------------------------  1. Chronic bilateral low back pain with bilateral sciatica Proceed with her second epidural today.  Gone over the risks and benefits of the procedure with her in full detail.  She is due for return to clinic in 2 months for medication refill.  - Lumbar Epidural Injection - Lumbar Epidural Injection  2. Degenerative disc disease, lumbar As above - Lumbar Epidural Injection  3. Lumbosacral spondylosis without myelopathy As above - Lumbar Epidural Injection  4. Chronic pain syndrome Refill her medications today for July and August.  We have reviewed the [facility] practitioner database information and it is appropriate.   She is using these sparingly and appropriately.  5. Chronic, continuous use of opioids Above  6. Facet syndrome, lumbar As above continue  with core stretching strengthening exercises as once again reviewed today and literature was provided today.  To return to clinic in 2 months    ----------------------------------------------------------------------------------------------------------------------  I am having Katherine Lester maintain her Vitamin D3, zolpidem, cyclobenzaprine, DULoxetine, pantoprazole, cetirizine, polyethylene glycol, levofloxacin, ondansetron, busPIRone, DULoxetine, pantoprazole, Vitamin D (Ergocalciferol), and HYDROcodone-acetaminophen. We administered triamcinolone acetonide, sodium chloride flush, ropivacaine (PF) 2 mg/mL (0.2%), midazolam, lidocaine (PF), lactated ringers, iopamidol, and dexamethasone. We will continue to administer bupivacaine (PF), fentaNYL, lactated ringers, midazolam, orphenadrine, triamcinolone acetonide, bupivacaine (PF), fentaNYL, lactated ringers, midazolam, orphenadrine, triamcinolone acetonide, bupivacaine (PF), orphenadrine, triamcinolone acetonide, lactated ringers, sodium chloride flush, triamcinolone acetonide, lactated ringers, bupivacaine (PF), orphenadrine, triamcinolone acetonide, and sodium chloride flush.   Meds ordered this encounter  Medications  . triamcinolone acetonide (KENALOG-40) injection 40 mg  . sodium chloride flush (NS) 0.9 % injection 10 mL  . ropivacaine (PF) 2 mg/mL (0.2%) (NAROPIN) injection 10 mL  . midazolam (VERSED) 5 MG/5ML injection 5 mg  . lidocaine (PF) (XYLOCAINE) 1 % injection 5 mL  . lactated ringers infusion 1,000 mL  . iopamidol (ISOVUE-M) 41 % intrathecal injection 20 mL  . dexamethasone (DECADRON) injection 4 mg  . HYDROcodone-acetaminophen (NORCO/VICODIN) 5-325 MG tablet    Sig: Take 1 tablet by mouth 2 (two) times daily.    Dispense:  45 tablet    Refill:  0    Do not fill until [id number]    Patient's Medications  New Prescriptions   No medications on file  Previous Medications   BUSPIRONE (BUSPAR) 10 MG TABLET    Take 10 mg by mouth 3 (three) times daily.   CETIRIZINE (ZYRTEC) 10 MG TABLET    Take 10 mg by mouth.   CHOLECALCIFEROL (VITAMIN D3) 10000 UNITS CAPSULE    Take 50,000 Units by mouth once a week.    CYCLOBENZAPRINE (FLEXERIL) 10 MG TABLET    Limit 1 tab by mouth 2-4 times a day if tolerated   DULOXETINE (CYMBALTA) 20 MG CAPSULE    Limit 1 capsule by mouth per day if tolerated   DULOXETINE (CYMBALTA) 60 MG CAPSULE    Take 60 mg by mouth daily.    LEVOFLOXACIN (LEVAQUIN) 500 MG TABLET    Take 1 tablet by mouth daily.   ONDANSETRON (ZOFRAN-ODT) 4 MG DISINTEGRATING TABLET    Take 4 mg by mouth every 8 (eight) hours as needed for nausea or vomiting.   PANTOPRAZOLE (PROTONIX) 40 MG TABLET    Take 40 mg by mouth daily.    PANTOPRAZOLE (PROTONIX) 40 MG TABLET       POLYETHYLENE GLYCOL (MIRALAX / GLYCOLAX) PACKET       VITAMIN D, ERGOCALCIFEROL, (DRISDOL) 50000 UNITS CAPS CAPSULE       ZOLPIDEM (AMBIEN) 5 MG TABLET    Take 5 mg by mouth at bedtime.  Modified Medications   Modified Medication Previous Medication   HYDROCODONE-ACETAMINOPHEN (NORCO/VICODIN) 5-325 MG TABLET HYDROcodone-acetaminophen (NORCO/VICODIN) 5-325 MG tablet      Take 1 tablet by mouth 2 (two) times daily.    Take 1 tablet by mouth 2 (two) times daily.  Discontinued Medications   No medications on file   ----------------------------------------------------------------------------------------------------------------------  Follow-up: Return for evaluation, med refill.   Procedure: 5 S1 LESI with fluoroscopic guidance and with moderate sedation  NOTE: The risks, benefits, and expectations of the procedure have been discussed and explained to the patient who was understanding and in agreement with suggested treatment plan. No guarantees were made.  DESCRIPTION OF PROCEDURE: Lumbar  epidural steroid  injection with 4 mg IV Versed, EKG, blood pressure, pulse, and pulse oximetry monitoring. The procedure was performed with the patient in the prone position under fluoroscopic guidance.  Sterile prep x3 was initiated and I then injected subcutaneous lidocaine to the overlying L5-S1 site after its fluoroscopic identifictation.  Using strict aseptic technique, I then advanced an 18-gauge Tuohy epidural needle in the midline using interlaminar approach via loss-of-resistance to saline technique. There was negative aspiration for heme or  CSF.  I then confirmed position with both AP and Lateral fluoroscan.  2 cc of Isovue were injected and a  total of 5 mL of Preservative-Free normal saline mixed with 40 mg of Kenalog and 1cc Ropicaine 0.2 percent were injected incrementally via the  epidurally placed needle. The needle was removed. The patient tolerated the injection well and was convalesced and discharged to home in stable condition. Should the patient have any post procedure difficulty they have been instructed on how to contact us for assistance.   Trigger point injection: The area overlying the aforementioned trigger points were prepped with alcohol. They were then injected with a 25-gauge needle with 4 cc of ropivacaine 0.2% and Decadron 4 mg at each site after negative aspiration for heme. This was performed after informed consent was obtained and risks and benefits reviewed. She tolerated this procedure without difficulty and was convalesced and discharged to home in stable condition for follow-up as mentioned.  @Briyana Badman  Andree Elk, MD@   Molli Barrows, MD

## 2018-01-17 NOTE — Telephone Encounter (Signed)
Spoke with patient re; procedure no questions or concerns.

## 2018-03-15 ENCOUNTER — Other Ambulatory Visit: Payer: Self-pay

## 2018-03-15 ENCOUNTER — Encounter: Payer: Self-pay | Admitting: Anesthesiology

## 2018-03-15 ENCOUNTER — Ambulatory Visit: Payer: BC Managed Care – PPO | Attending: Anesthesiology | Admitting: Anesthesiology

## 2018-03-15 VITALS — BP 141/88 | HR 89 | Temp 98.4°F | Resp 18 | Ht 66.0 in | Wt 178.0 lb

## 2018-03-15 DIAGNOSIS — Z79891 Long term (current) use of opiate analgesic: Secondary | ICD-10-CM | POA: Diagnosis not present

## 2018-03-15 DIAGNOSIS — G8929 Other chronic pain: Secondary | ICD-10-CM

## 2018-03-15 DIAGNOSIS — M533 Sacrococcygeal disorders, not elsewhere classified: Secondary | ICD-10-CM

## 2018-03-15 DIAGNOSIS — M5441 Lumbago with sciatica, right side: Secondary | ICD-10-CM | POA: Insufficient documentation

## 2018-03-15 DIAGNOSIS — M7060 Trochanteric bursitis, unspecified hip: Secondary | ICD-10-CM

## 2018-03-15 DIAGNOSIS — G894 Chronic pain syndrome: Secondary | ICD-10-CM | POA: Diagnosis not present

## 2018-03-15 DIAGNOSIS — M47817 Spondylosis without myelopathy or radiculopathy, lumbosacral region: Secondary | ICD-10-CM | POA: Diagnosis not present

## 2018-03-15 DIAGNOSIS — Z79899 Other long term (current) drug therapy: Secondary | ICD-10-CM | POA: Diagnosis not present

## 2018-03-15 DIAGNOSIS — M47816 Spondylosis without myelopathy or radiculopathy, lumbar region: Secondary | ICD-10-CM

## 2018-03-15 DIAGNOSIS — M5136 Other intervertebral disc degeneration, lumbar region: Secondary | ICD-10-CM | POA: Diagnosis not present

## 2018-03-15 DIAGNOSIS — F1190 Opioid use, unspecified, uncomplicated: Secondary | ICD-10-CM

## 2018-03-15 DIAGNOSIS — M5442 Lumbago with sciatica, left side: Secondary | ICD-10-CM

## 2018-03-15 MED ORDER — HYDROCODONE-ACETAMINOPHEN 5-325 MG PO TABS: 1.0000 | ORAL_TABLET | Freq: Two times a day (BID) | ORAL | 0 refills | Status: DC

## 2018-03-15 NOTE — Progress Notes (Signed)
Nursing Pain Medication Assessment:  Safety precautions to be maintained throughout the outpatient stay will include: orient to surroundings, keep bed in low position, maintain call bell within reach at all times, provide assistance with transfer out of bed and ambulation.  Medication Inspection Compliance: Pill count conducted under aseptic conditions, in front of the patient. Neither the pills nor the bottle was removed from the patient's sight at any time. Once count was completed pills were immediately returned to the patient in their original bottle.  Medication: Hydrocodone/APAP Pill/Patch Count: 14.5 of 45 pills remain Pill/Patch Appearance: Markings consistent with prescribed medication Bottle Appearance: Standard pharmacy container. Clearly labeled. Filled Date: 09 / 19 / 2019 Last Medication intake:  Day before yesterday

## 2018-03-15 NOTE — Progress Notes (Signed)
Subjective:  Patient ID: Katherine Lester, female    DOB: April 23, 1956  Age: 62 y.o. MRN: [id number]  CC: Back Pain (lower)   Procedure: None  HPI Aisia Correira presents for reevaluation.  She was last seen several weeks ago and had an epidural at that time and reports that she is done well.  She continues to get good relief with the injections rated at 75% improvement.  She has these done periodically throughout the year to keep her pain under good control.  She has gone back to full-time work and this is causing some strain on her back.  She is taking her medication sparingly generally taking no more than 1 hydrocodone tablet per day.  She generally uses this at night to help with sleep and pain relief.  No change in the quality characteristic or distribution of pain or otherwise noted and she appears to be doing well.  Outpatient Medications Prior to Visit  Medication Sig Dispense Refill  . busPIRone (BUSPAR) 10 MG tablet Take 10 mg by mouth 3 (three) times daily.    . cetirizine (ZYRTEC) 10 MG tablet Take 10 mg by mouth.    . Cholecalciferol (VITAMIN D3) 10000 UNITS capsule Take 50,000 Units by mouth once a week.     . DULoxetine (CYMBALTA) 60 MG capsule Take 60 mg by mouth daily.     . pantoprazole (PROTONIX) 40 MG tablet     . polyethylene glycol (MIRALAX / GLYCOLAX) packet     . Vitamin D, Ergocalciferol, (DRISDOL) 50000 units CAPS capsule     . zolpidem (AMBIEN) 5 MG tablet Take 5 mg by mouth at bedtime.    Marland Kitchen HYDROcodone-acetaminophen (NORCO/VICODIN) 5-325 MG tablet Take 1 tablet by mouth 2 (two) times daily. 45 tablet 0  . cyclobenzaprine (FLEXERIL) 10 MG tablet Limit 1 tab by mouth 2-4 times a day if tolerated (Patient not taking: Reported on 03/15/2018) 100 tablet 0  . DULoxetine (CYMBALTA) 20 MG capsule Limit 1 capsule by mouth per day if tolerated (Patient not taking: Reported on 01/16/2018) 30 capsule 0  . levofloxacin (LEVAQUIN) 500 MG tablet Take 1 tablet by mouth daily.     . ondansetron (ZOFRAN-ODT) 4 MG disintegrating tablet Take 4 mg by mouth every 8 (eight) hours as needed for nausea or vomiting.    . pantoprazole (PROTONIX) 40 MG tablet Take 40 mg by mouth daily.      Facility-Administered Medications Prior to Visit  Medication Dose Route Frequency Provider Last Rate Last Dose  . bupivacaine (PF) (MARCAINE) 0.25 % injection 30 mL  30 mL Other Once Mohammed Kindle, MD      . bupivacaine (PF) (MARCAINE) 0.25 % injection 30 mL  30 mL Other Once Mohammed Kindle, MD      . bupivacaine (PF) (MARCAINE) 0.25 % injection 30 mL  30 mL Other Once Mohammed Kindle, MD      . bupivacaine (PF) (MARCAINE) 0.25 % injection 30 mL  30 mL Other Once Mohammed Kindle, MD      . fentaNYL (SUBLIMAZE) injection 100 mcg  100 mcg Intravenous Once Mohammed Kindle, MD      . fentaNYL (SUBLIMAZE) injection 100 mcg  100 mcg Intravenous Once Mohammed Kindle, MD      . lactated ringers infusion 1,000 mL  1,000 mL Intravenous Continuous Mohammed Kindle, MD      . lactated ringers infusion 1,000 mL  1,000 mL Intravenous Continuous Mohammed Kindle, MD      . lactated ringers infusion  1,000 mL  1,000 mL Intravenous Continuous Mohammed Kindle, MD 125 mL/hr at 10/15/15 0923 1,000 mL at 10/15/15 0923  . lactated ringers infusion 1,000 mL  1,000 mL Intravenous Continuous Mohammed Kindle, MD 125 mL/hr at 01/12/16 0950 1,000 mL at 01/12/16 0950  . midazolam (VERSED) 5 MG/5ML injection 5 mg  5 mg Intravenous Once Mohammed Kindle, MD      . midazolam (VERSED) 5 MG/5ML injection 5 mg  5 mg Intravenous Once Mohammed Kindle, MD      . orphenadrine (NORFLEX) injection 60 mg  60 mg Intramuscular Once Mohammed Kindle, MD      . orphenadrine (NORFLEX) injection 60 mg  60 mg Intramuscular Once Mohammed Kindle, MD      . orphenadrine (NORFLEX) injection 60 mg  60 mg Intramuscular Once Mohammed Kindle, MD      . orphenadrine (NORFLEX) injection 60 mg  60 mg Intramuscular Once Mohammed Kindle, MD      . sodium chloride flush  (NS) 0.9 % injection 20 mL  20 mL Other Once Mohammed Kindle, MD      . sodium chloride flush (NS) 0.9 % injection 20 mL  20 mL Other Once Mohammed Kindle, MD      . triamcinolone acetonide (KENALOG-40) injection 40 mg  40 mg Other Once Mohammed Kindle, MD      . triamcinolone acetonide (KENALOG-40) injection 40 mg  40 mg Other Once Mohammed Kindle, MD      . triamcinolone acetonide (KENALOG-40) injection 40 mg  40 mg Other Once Mohammed Kindle, MD      . triamcinolone acetonide (KENALOG-40) injection 40 mg  40 mg Other Once Mohammed Kindle, MD      . triamcinolone acetonide (KENALOG-40) injection 40 mg  40 mg Other Once Mohammed Kindle, MD        Review of Systems CNS: No confusion or sedation Cardiac: No angina or palpitations GI: No abdominal pain or constipation Constitutional: No nausea vomiting fevers or chills  Objective:  BP (!) 141/88   Pulse 89   Temp 98.4 F (36.9 C) (Oral)   Resp 18   Ht 5\' 6"  (1.676 m)   Wt 178 lb (80.7 kg)   SpO2 100%   BMI 28.73 kg/m    BP Readings from Last 3 Encounters:  03/15/18 (!) 141/88  01/16/18 115/87  11/10/17 125/84     Wt Readings from Last 3 Encounters:  03/15/18 178 lb (80.7 kg)  01/16/18 175 lb (79.4 kg)  11/10/17 175 lb (79.4 kg)     Physical Exam Pt is alert and oriented PERRL EOMI HEART IS RRR no murmur or rub LCTA no wheezing or rales MUSCULOSKELETAL reveals some paraspinous muscle tenderness but no overt trigger points.  Her muscle tone and bulk is good she is ambulating well  Labs  No results found for: HGBA1C Lab Results  Component Value Date   CREATININE 0.72 03/04/2017    -------------------------------------------------------------------------------------------------------------------- Lab Results  Component Value Date   WBC 12.0 (H) 03/04/2017   HGB 15.3 03/04/2017   HCT 44.2 03/04/2017   PLT 356 03/04/2017   GLUCOSE 114 (H) 03/04/2017   NA 139 03/04/2017   K 3.5 03/04/2017   CL 103 03/04/2017    CREATININE 0.72 03/04/2017   BUN 12 03/04/2017   CO2 26 03/04/2017   TSH 0.590 03/04/2017    --------------------------------------------------------------------------------------------------------------------- Dg C-arm 1-60 Min-no Report  Result Date: 01/16/2018 Fluoroscopy was utilized by the requesting physician.  No radiographic interpretation.     Assessment &  Plan:   Earlene was seen today for back pain.  Diagnoses and all orders for this visit:  Chronic bilateral low back pain with bilateral sciatica -     Lumbar Epidural Injection; Future  Degenerative disc disease, lumbar -     Lumbar Epidural Injection; Future  Lumbosacral spondylosis without myelopathy -     Lumbar Epidural Injection; Future  Chronic pain syndrome  Chronic, continuous use of opioids  Facet syndrome, lumbar  Greater trochanteric bursitis, unspecified laterality  Sacroiliac joint dysfunction  Other orders -     HYDROcodone-acetaminophen (NORCO/VICODIN) 5-325 MG tablet; Take 1 tablet by mouth 2 (two) times daily.        ----------------------------------------------------------------------------------------------------------------------  Problem List Items Addressed This Visit      Unprioritized   Degenerative disc disease, lumbar   Relevant Medications   HYDROcodone-acetaminophen (NORCO/VICODIN) 5-325 MG tablet   Other Relevant Orders   Lumbar Epidural Injection   Facet syndrome, lumbar   Relevant Medications   HYDROcodone-acetaminophen (NORCO/VICODIN) 5-325 MG tablet   Greater trochanteric bursitis   Sacroiliac joint dysfunction    Other Visit Diagnoses    Chronic bilateral low back pain with bilateral sciatica    -  Primary   Relevant Medications   HYDROcodone-acetaminophen (NORCO/VICODIN) 5-325 MG tablet   Other Relevant Orders   Lumbar Epidural Injection   Lumbosacral spondylosis without myelopathy       Relevant Medications   HYDROcodone-acetaminophen (NORCO/VICODIN)  5-325 MG tablet   Other Relevant Orders   Lumbar Epidural Injection   Chronic pain syndrome       Chronic, continuous use of opioids            ----------------------------------------------------------------------------------------------------------------------  1. Chronic bilateral low back pain with bilateral sciatica Defer on any repeat injections at this point we will schedule her for return to clinic in November for a repeat epidural.  She seems to do well with this.  She is failed conservative therapy with physical therapy not giving her much relief in the medication to help but not as effectively as the epidural injections.  Denies any loss of strength to the lower extremities or bowel or bladder dysfunction. - Lumbar Epidural Injection; Future  2. Degenerative disc disease, lumbar As above - Lumbar Epidural Injection; Future  3. Lumbosacral spondylosis without myelopathy As above - Lumbar Epidural Injection; Future  4. Chronic pain syndrome Have reviewed the [facility] practitioner database information and it is appropriate.  We will refill her hydrocodone for 45 tablets dated today.  5. Chronic, continuous use of opioids As abov  6. Facet syndrome, lumbar   7. Greater trochanteric bursitis, unspecified laterality   8. Sacroiliac joint dysfunction     ----------------------------------------------------------------------------------------------------------------------  I am having Iana A. Lovena Le maintain her Vitamin D3, zolpidem, cyclobenzaprine, DULoxetine, pantoprazole, cetirizine, polyethylene glycol, levofloxacin, ondansetron, busPIRone, DULoxetine, pantoprazole, Vitamin D (Ergocalciferol), and HYDROcodone-acetaminophen. We will continue to administer bupivacaine (PF), fentaNYL, lactated ringers, midazolam, orphenadrine, triamcinolone acetonide, bupivacaine (PF), fentaNYL, lactated ringers, midazolam, orphenadrine, triamcinolone acetonide, bupivacaine  (PF), orphenadrine, triamcinolone acetonide, lactated ringers, sodium chloride flush, triamcinolone acetonide, lactated ringers, bupivacaine (PF), orphenadrine, triamcinolone acetonide, and sodium chloride flush.   Meds ordered this encounter  Medications  . HYDROcodone-acetaminophen (NORCO/VICODIN) 5-325 MG tablet    Sig: Take 1 tablet by mouth 2 (two) times daily.    Dispense:  45 tablet    Refill:  0    Do not fill until [id number]   Patient's Medications  New Prescriptions   No medications on file  Previous Medications   BUSPIRONE (BUSPAR) 10 MG TABLET    Take 10 mg by mouth 3 (three) times daily.   CETIRIZINE (ZYRTEC) 10 MG TABLET    Take 10 mg by mouth.   CHOLECALCIFEROL (VITAMIN D3) 10000 UNITS CAPSULE    Take 50,000 Units by mouth once a week.    CYCLOBENZAPRINE (FLEXERIL) 10 MG TABLET    Limit 1 tab by mouth 2-4 times a day if tolerated   DULOXETINE (CYMBALTA) 20 MG CAPSULE    Limit 1 capsule by mouth per day if tolerated   DULOXETINE (CYMBALTA) 60 MG CAPSULE    Take 60 mg by mouth daily.    LEVOFLOXACIN (LEVAQUIN) 500 MG TABLET    Take 1 tablet by mouth daily.   ONDANSETRON (ZOFRAN-ODT) 4 MG DISINTEGRATING TABLET    Take 4 mg by mouth every 8 (eight) hours as needed for nausea or vomiting.   PANTOPRAZOLE (PROTONIX) 40 MG TABLET    Take 40 mg by mouth daily.    PANTOPRAZOLE (PROTONIX) 40 MG TABLET       POLYETHYLENE GLYCOL (MIRALAX / GLYCOLAX) PACKET       VITAMIN D, ERGOCALCIFEROL, (DRISDOL) 50000 UNITS CAPS CAPSULE       ZOLPIDEM (AMBIEN) 5 MG TABLET    Take 5 mg by mouth at bedtime.  Modified Medications   Modified Medication Previous Medication   HYDROCODONE-ACETAMINOPHEN (NORCO/VICODIN) 5-325 MG TABLET HYDROcodone-acetaminophen (NORCO/VICODIN) 5-325 MG tablet      Take 1 tablet by mouth 2 (two) times daily.    Take 1 tablet by mouth 2 (two) times daily.  Discontinued Medications   No medications on file    ----------------------------------------------------------------------------------------------------------------------  Follow-up: Return in about 1 month (around 04/15/2018) for evaluation, procedure.    Molli Barrows, MD

## 2018-03-15 NOTE — Patient Instructions (Signed)
Paper script for hydrocodone given to patient X 1.

## 2018-04-25 ENCOUNTER — Other Ambulatory Visit: Payer: Self-pay | Admitting: Anesthesiology

## 2018-04-25 ENCOUNTER — Ambulatory Visit
Admission: RE | Admit: 2018-04-25 | Discharge: 2018-04-25 | Disposition: A | Discharge status: Home / Self Care | Payer: BC Managed Care – PPO | Source: Ambulatory Visit | Attending: Anesthesiology | Admitting: Anesthesiology

## 2018-04-25 ENCOUNTER — Ambulatory Visit (HOSPITAL_BASED_OUTPATIENT_CLINIC_OR_DEPARTMENT_OTHER): Payer: BC Managed Care – PPO | Admitting: Anesthesiology

## 2018-04-25 ENCOUNTER — Encounter: Payer: Self-pay | Admitting: Anesthesiology

## 2018-04-25 ENCOUNTER — Other Ambulatory Visit: Payer: Self-pay

## 2018-04-25 VITALS — BP 120/83 | HR 79 | Temp 98.7°F | Resp 18 | Ht 66.0 in | Wt 178.0 lb

## 2018-04-25 DIAGNOSIS — M5442 Lumbago with sciatica, left side: Secondary | ICD-10-CM | POA: Insufficient documentation

## 2018-04-25 DIAGNOSIS — M5136 Other intervertebral disc degeneration, lumbar region: Secondary | ICD-10-CM | POA: Diagnosis present

## 2018-04-25 DIAGNOSIS — G8929 Other chronic pain: Secondary | ICD-10-CM | POA: Diagnosis present

## 2018-04-25 DIAGNOSIS — M47816 Spondylosis without myelopathy or radiculopathy, lumbar region: Secondary | ICD-10-CM

## 2018-04-25 DIAGNOSIS — F1190 Opioid use, unspecified, uncomplicated: Secondary | ICD-10-CM | POA: Insufficient documentation

## 2018-04-25 DIAGNOSIS — G894 Chronic pain syndrome: Secondary | ICD-10-CM | POA: Diagnosis present

## 2018-04-25 DIAGNOSIS — M5441 Lumbago with sciatica, right side: Secondary | ICD-10-CM

## 2018-04-25 DIAGNOSIS — M47817 Spondylosis without myelopathy or radiculopathy, lumbosacral region: Secondary | ICD-10-CM | POA: Insufficient documentation

## 2018-04-25 DIAGNOSIS — M7060 Trochanteric bursitis, unspecified hip: Secondary | ICD-10-CM | POA: Insufficient documentation

## 2018-04-25 DIAGNOSIS — R52 Pain, unspecified: Secondary | ICD-10-CM | POA: Diagnosis present

## 2018-04-25 MED ORDER — SODIUM CHLORIDE 0.9% FLUSH
10.0000 mL | Freq: Once | INTRAVENOUS | Status: AC
  Administered 2018-04-25: 10 mL

## 2018-04-25 MED ORDER — ROPIVACAINE HCL 2 MG/ML IJ SOLN
10.0000 mL | Freq: Once | INTRAMUSCULAR | Status: AC
  Administered 2018-04-25: 10 mL via EPIDURAL
  Filled 2018-04-25: qty 10

## 2018-04-25 MED ORDER — LACTATED RINGERS IV SOLN
1000.0000 mL | INTRAVENOUS | Status: DC
  Administered 2018-04-25: 1000 mL via INTRAVENOUS

## 2018-04-25 MED ORDER — HYDROCODONE-ACETAMINOPHEN 5-325 MG PO TABS: 1.0000 | ORAL_TABLET | Freq: Two times a day (BID) | ORAL | 0 refills | Status: DC

## 2018-04-25 MED ORDER — MIDAZOLAM HCL 2 MG/2ML IJ SOLN
5.0000 mg | Freq: Once | INTRAMUSCULAR | Status: AC
  Administered 2018-04-25: 3 mg via INTRAVENOUS

## 2018-04-25 MED ORDER — MIDAZOLAM HCL 5 MG/5ML IJ SOLN
INTRAMUSCULAR | Status: AC
  Filled 2018-04-25: qty 5

## 2018-04-25 MED ORDER — TRIAMCINOLONE ACETONIDE 40 MG/ML IJ SUSP
40.0000 mg | Freq: Once | INTRAMUSCULAR | Status: AC
  Administered 2018-04-25: 40 mg
  Filled 2018-04-25: qty 1

## 2018-04-25 MED ORDER — DEXAMETHASONE SODIUM PHOSPHATE 10 MG/ML IJ SOLN
10.0000 mg | Freq: Once | INTRAMUSCULAR | Status: AC
  Administered 2018-04-25: 10 mg
  Filled 2018-04-25: qty 1

## 2018-04-25 MED ORDER — IOPAMIDOL (ISOVUE-M 200) INJECTION 41%
20.0000 mL | Freq: Once | INTRAMUSCULAR | Status: DC | PRN
  Administered 2018-04-25: 10 mL
  Filled 2018-04-25: qty 20

## 2018-04-25 MED ORDER — LIDOCAINE HCL (PF) 1 % IJ SOLN
5.0000 mL | Freq: Once | INTRAMUSCULAR | Status: AC
  Administered 2018-04-25: 5 mL via SUBCUTANEOUS
  Filled 2018-04-25: qty 5

## 2018-04-25 NOTE — Patient Instructions (Signed)
Post-op Pain Management on a Chronic Pain Patient  Why should the surgeon manage his patient's post-op pain? The Surgeon is uniquely qualified to determine the amount of post-operative pain to be expected on a procedure or surgery that he/she has performed. Even with similar surgeries, the surgeon's perspective on expected pain is unique, since he/she performed the procedure and knows the degree of difficulty and/or tissue damage involved in each particular case. The surgeon is also up to date on events such as blood loss, intraoperative complications, and PO (per orum) status that may influence not only the patient's dose and schedule, but route of administration as well.  How about telling chronic pain patients to just double up or increase their usual pain medication intake to compensate for the increased pain? This is a bad idea since it will lead to the patient running out of his/her usual medications early and this may create a problem at the level of the insurance, which supplies medications based on the amount and schedule stated on the prescription. Running out early may trigger an event where the refill is denied by the insurance company and/or pharmacy. In addition, this practice provides a very poor paper trail as to why this patient ran out of medication early. In addition, from the perspective of the pain physician, it creates a nightmare in the accounting of the patient's medication.  So, what should I do as a Psychologist, sport and exercise when confronted with a patient that needs surgery and already takes a significant amount of pain medicine for their chronic pain, which may or may not be related to the surgery I have to perform?  This is what the surgeon should do: 1. Do not change the dose or schedule of the pain medications prescribed by the pain specialist. This medication regimen allows for the patient's chronic pain to be under control, so as to bring that patient down to the level of an average  individual. 2. Have the patient continue their usual pain management regimen, without any alterations. In addition, manage the post-operative pain as you would on any other "narcotic naive" patient. Do not attempt to compensate for tolerance. This is what the patient's usual regimen will do for you. Simply treat the patient as if they had no chronic pain and as if they were taking no other pain medications. 3. Talk to the patient about the medication, just like you would for anyone else. Do not assume that they are experts in opioids. Make sure you let the patient know that the medication is to be used only if absolutely necessary. (PRN) 4. Prescribe the medication for as long as you would on any other patient undergoing the same type of surgery. Prescribe for the same average amount of time that you would on any other patient. Avoid prescribing for longer periods. 5. Send [place] a copy of the operative report with information about your choice of the post-op pain medication provided. 6. Keep [place] informed of any complications that may prolong the average duration patient's post-op pain.  If you have any questions, please feel to contact us at [phone]. _____________________________________________________________________________________________  ____________________________________________________________________________________________  Post-Procedure Discharge Instructions  Instructions:  Apply ice: Fill a plastic sandwich bag with crushed ice. Cover it with a small towel and apply to injection site. Apply for 15 minutes then remove x 15 minutes. Repeat sequence on day of procedure, until you go to bed. The purpose is to minimize swelling and discomfort after procedure.  Apply heat: Apply heat to procedure  site starting the day following the procedure. The purpose is to treat any soreness and discomfort from the procedure.  Food intake: Start with clear liquids (like water) and advance to  regular food, as tolerated.   Physical activities: Keep activities to a minimum for the first 8 hours after the procedure.   Driving: If you have received any sedation, you are not allowed to drive for 24 hours after your procedure.  Blood thinner: Restart your blood thinner 6 hours after your procedure. (Only for those taking blood thinners)  Insulin: As soon as you can eat, you may resume your normal dosing schedule. (Only for those taking insulin)  Infection prevention: Keep procedure site clean and dry.  Post-procedure Pain Diary: Extremely important that this be done correctly and accurately. Recorded information will be used to determine the next step in treatment.  Pain evaluated is that of treated area only. Do not include pain from an untreated area.  Complete every hour, on the hour, for the initial 8 hours. Set an alarm to help you do this part accurately.  Do not go to sleep and have it completed later. It will not be accurate.  Follow-up appointment: Keep your follow-up appointment after the procedure. Usually 2 weeks for most procedures. (6 weeks in the case of radiofrequency.) Bring you pain diary.   Expect:  From numbing medicine (AKA: Local Anesthetics): Numbness or decrease in pain.  Onset: Full effect within 15 minutes of injected.  Duration: It will depend on the type of local anesthetic used. On the average, 1 to 8 hours.   From steroids: Decrease in swelling or inflammation. Once inflammation is improved, relief of the pain will follow.  Onset of benefits: Depends on the amount of swelling present. The more swelling, the longer it will take for the benefits to be seen. In some cases, up to 10 days.  Duration: Steroids will stay in the system x 2 weeks. Duration of benefits will depend on multiple posibilities including persistent irritating factors.  Occasional side-effects: Facial flushing (red, warm cheeks) , cramps (if present, drink Gatorade and take  over-the-counter Magnesium 450-500 mg once to twice a day).  From procedure: Some discomfort is to be expected once the numbing medicine wears off. This should be minimal if ice and heat are applied as instructed.  Call if:  You experience numbness and weakness that gets worse with time, as opposed to wearing off.  New onset bowel or bladder incontinence. (This applies to Spinal procedures only)  Emergency Numbers:  [place] business hours (Monday - Thursday, 8:00 AM - 4:00 PM) (Friday, 9:00 AM - 12:00 Noon): [phone]  After hours: [phone] ____________________________________________________________________________________________

## 2018-04-25 NOTE — Progress Notes (Signed)
Subjective:  Patient ID: Katherine Lester, female    DOB: November 03, 1955  Age: 62 y.o. MRN: [id number]  CC: Back Pain (low)   Procedure: L4-5 epidural steroid and fluoroscopic guidance with moderate sedation. 2.  Bilateral L4 trigger point injections  HPI Katherine Lester presents for reevaluation.  She was last seen several weeks ago and has had previous epidurals this year.  Her last epidural was back in August.  She reports approximately 75% improvement of her low back pain and posterior lateral leg pain generally left greater than right in severity that improves for approximately 6 to 8 weeks following her injection.  She presents today with recurrence of the same quality characteristic distribution of pain.  She continues to do conservative therapy including physical therapy and exercises with sparing use of hydrocodone but despite this she continues to have severe pain that is unremitting.  No changes in lower extremity strength or function or bowel bladder function are noted.  Based on her narcotic assessment sheet she continues to derive good functional lifestyle improvement with her medications.  Outpatient Medications Prior to Visit  Medication Sig Dispense Refill  . busPIRone (BUSPAR) 10 MG tablet Take 10 mg by mouth 3 (three) times daily.    . Cholecalciferol (VITAMIN D3) 10000 UNITS capsule Take 50,000 Units by mouth once a week.     . DULoxetine (CYMBALTA) 60 MG capsule Take 60 mg by mouth daily.     . pantoprazole (PROTONIX) 40 MG tablet     . polyethylene glycol (MIRALAX / GLYCOLAX) packet     . Vitamin D, Ergocalciferol, (DRISDOL) 50000 units CAPS capsule     . zolpidem (AMBIEN) 5 MG tablet Take 5 mg by mouth at bedtime.    Marland Kitchen HYDROcodone-acetaminophen (NORCO/VICODIN) 5-325 MG tablet Take 1 tablet by mouth 2 (two) times daily. 45 tablet 0  . cetirizine (ZYRTEC) 10 MG tablet Take 10 mg by mouth.    . cyclobenzaprine (FLEXERIL) 10 MG tablet Limit 1 tab by mouth 2-4 times a day  if tolerated (Patient not taking: Reported on 03/15/2018) 100 tablet 0  . DULoxetine (CYMBALTA) 20 MG capsule Limit 1 capsule by mouth per day if tolerated (Patient not taking: Reported on 01/16/2018) 30 capsule 0  . levofloxacin (LEVAQUIN) 500 MG tablet Take 1 tablet by mouth daily.    . ondansetron (ZOFRAN-ODT) 4 MG disintegrating tablet Take 4 mg by mouth every 8 (eight) hours as needed for nausea or vomiting.    . pantoprazole (PROTONIX) 40 MG tablet Take 40 mg by mouth daily.      Facility-Administered Medications Prior to Visit  Medication Dose Route Frequency Provider Last Rate Last Dose  . bupivacaine (PF) (MARCAINE) 0.25 % injection 30 mL  30 mL Other Once Mohammed Kindle, MD      . bupivacaine (PF) (MARCAINE) 0.25 % injection 30 mL  30 mL Other Once Mohammed Kindle, MD      . bupivacaine (PF) (MARCAINE) 0.25 % injection 30 mL  30 mL Other Once Mohammed Kindle, MD      . bupivacaine (PF) (MARCAINE) 0.25 % injection 30 mL  30 mL Other Once Mohammed Kindle, MD      . fentaNYL (SUBLIMAZE) injection 100 mcg  100 mcg Intravenous Once Mohammed Kindle, MD      . fentaNYL (SUBLIMAZE) injection 100 mcg  100 mcg Intravenous Once Mohammed Kindle, MD      . lactated ringers infusion 1,000 mL  1,000 mL Intravenous Continuous Mohammed Kindle, MD      .  lactated ringers infusion 1,000 mL  1,000 mL Intravenous Continuous Mohammed Kindle, MD      . lactated ringers infusion 1,000 mL  1,000 mL Intravenous Continuous Mohammed Kindle, MD 125 mL/hr at 10/15/15 0923 1,000 mL at 10/15/15 0923  . lactated ringers infusion 1,000 mL  1,000 mL Intravenous Continuous Mohammed Kindle, MD 125 mL/hr at 01/12/16 0950 1,000 mL at 01/12/16 0950  . midazolam (VERSED) 5 MG/5ML injection 5 mg  5 mg Intravenous Once Mohammed Kindle, MD      . midazolam (VERSED) 5 MG/5ML injection 5 mg  5 mg Intravenous Once Mohammed Kindle, MD      . orphenadrine (NORFLEX) injection 60 mg  60 mg Intramuscular Once Mohammed Kindle, MD      . orphenadrine  (NORFLEX) injection 60 mg  60 mg Intramuscular Once Mohammed Kindle, MD      . orphenadrine (NORFLEX) injection 60 mg  60 mg Intramuscular Once Mohammed Kindle, MD      . orphenadrine (NORFLEX) injection 60 mg  60 mg Intramuscular Once Mohammed Kindle, MD      . sodium chloride flush (NS) 0.9 % injection 20 mL  20 mL Other Once Mohammed Kindle, MD      . sodium chloride flush (NS) 0.9 % injection 20 mL  20 mL Other Once Mohammed Kindle, MD      . triamcinolone acetonide (KENALOG-40) injection 40 mg  40 mg Other Once Mohammed Kindle, MD      . triamcinolone acetonide (KENALOG-40) injection 40 mg  40 mg Other Once Mohammed Kindle, MD      . triamcinolone acetonide (KENALOG-40) injection 40 mg  40 mg Other Once Mohammed Kindle, MD      . triamcinolone acetonide (KENALOG-40) injection 40 mg  40 mg Other Once Mohammed Kindle, MD      . triamcinolone acetonide (KENALOG-40) injection 40 mg  40 mg Other Once Mohammed Kindle, MD        Review of Systems CNS: No confusion or sedation Cardiac: No angina or palpitations GI: No abdominal pain or constipation Constitutional: No nausea vomiting fevers or chills  Objective:  BP 114/82   Pulse 79   Temp 98.7 F (37.1 C) (Temporal)   Resp 19   Ht 5\' 6"  (1.676 m)   Wt 178 lb (80.7 kg)   SpO2 97%   BMI 28.73 kg/m    BP Readings from Last 3 Encounters:  04/25/18 114/82  03/15/18 (!) 141/88  01/16/18 115/87     Wt Readings from Last 3 Encounters:  04/25/18 178 lb (80.7 kg)  03/15/18 178 lb (80.7 kg)  01/16/18 175 lb (79.4 kg)     Physical Exam Pt is alert and oriented PERRL EOMI HEART IS RRR no murmur or rub LCTA no wheezing or rales MUSCULOSKELETAL reveals some paraspinous muscle tenderness with 2 trigger points located approximately L4 level bilaterally 5 cm from the midline.  She has a positive straight leg raise on the left negative on the right with L5 radiculitis.  Her muscle tone and bulk is good.  Labs  No results found for: HGBA1C Lab  Results  Component Value Date   CREATININE 0.72 03/04/2017    -------------------------------------------------------------------------------------------------------------------- Lab Results  Component Value Date   WBC 12.0 (H) 03/04/2017   HGB 15.3 03/04/2017   HCT 44.2 03/04/2017   PLT 356 03/04/2017   GLUCOSE 114 (H) 03/04/2017   NA 139 03/04/2017   K 3.5 03/04/2017   CL 103 03/04/2017   CREATININE 0.72 03/04/2017  BUN 12 03/04/2017   CO2 26 03/04/2017   TSH 0.590 03/04/2017    --------------------------------------------------------------------------------------------------------------------- No results found.   Assessment & Plan:   Tyhesha was seen today for back pain.  Diagnoses and all orders for this visit:  Chronic bilateral low back pain with bilateral sciatica -     Lumbar Epidural Injection  Degenerative disc disease, lumbar -     Lumbar Epidural Injection  Lumbosacral spondylosis without myelopathy -     Lumbar Epidural Injection  Chronic pain syndrome  Chronic, continuous use of opioids  Facet syndrome, lumbar  Greater trochanteric bursitis, unspecified laterality  Other orders -     Discontinue: HYDROcodone-acetaminophen (NORCO/VICODIN) 5-325 MG tablet; Take 1 tablet by mouth 2 (two) times daily. -     HYDROcodone-acetaminophen (NORCO/VICODIN) 5-325 MG tablet; Take 1 tablet by mouth 2 (two) times daily. -     triamcinolone acetonide (KENALOG-40) injection 40 mg -     sodium chloride flush (NS) 0.9 % injection 10 mL -     ropivacaine (PF) 2 mg/mL (0.2%) (NAROPIN) injection 10 mL -     midazolam (VERSED) injection 5 mg -     lidocaine (PF) (XYLOCAINE) 1 % injection 5 mL -     lactated ringers infusion 1,000 mL -     iopamidol (ISOVUE-M) 41 % intrathecal injection 20 mL -     dexamethasone (DECADRON) injection 10  mg        ----------------------------------------------------------------------------------------------------------------------  Problem List Items Addressed This Visit      Unprioritized   Degenerative disc disease, lumbar   Relevant Medications   HYDROcodone-acetaminophen (NORCO/VICODIN) 5-325 MG tablet   triamcinolone acetonide (KENALOG-40) injection 40 mg   dexamethasone (DECADRON) injection 10 mg   Facet syndrome, lumbar   Relevant Medications   HYDROcodone-acetaminophen (NORCO/VICODIN) 5-325 MG tablet   triamcinolone acetonide (KENALOG-40) injection 40 mg   dexamethasone (DECADRON) injection 10 mg   Greater trochanteric bursitis    Other Visit Diagnoses    Chronic bilateral low back pain with bilateral sciatica    -  Primary   Relevant Medications   HYDROcodone-acetaminophen (NORCO/VICODIN) 5-325 MG tablet   triamcinolone acetonide (KENALOG-40) injection 40 mg   midazolam (VERSED) injection 5 mg   dexamethasone (DECADRON) injection 10 mg   Lumbosacral spondylosis without myelopathy       Relevant Medications   HYDROcodone-acetaminophen (NORCO/VICODIN) 5-325 MG tablet   triamcinolone acetonide (KENALOG-40) injection 40 mg   dexamethasone (DECADRON) injection 10 mg   Chronic pain syndrome       Chronic, continuous use of opioids            ----------------------------------------------------------------------------------------------------------------------  1. Chronic bilateral low back pain with bilateral sciatica We will proceed with a repeat epidural injection today.  The risks and benefits of been once again reviewed.  All questions answered.  My return to clinic in 2 months for reevaluation and try to space out a repeat epidural if needed in approximately 3 months.  Unfortunately she is failed conservative therapy and generally gets good relief and significant improvement in her overall functioning with the injections.  She is considered a nonsurgical candidate.   I want her to continue with her core stretching strengthening exercises in the meantime.  We will also refill her medications for hydrocodone.  She takes approximate 1 tablet/day on average.  We have reviewed the [facility] practitioner database information and it is appropriate for refill. - Lumbar Epidural Injection  2. Degenerative disc disease, lumbar As above -  Lumbar Epidural Injection  3. Lumbosacral spondylosis without myelopathy As above - Lumbar Epidural Injection  4. Chronic pain syndrome Continue current medication management with refills given for today and 1 month from today for December 24.  5. Chronic, continuous use of opioids As above  6. Facet syndrome, lumbar To new core stretching strengthening  7. Greater trochanteric bursitis, unspecified laterality Continue follow-up with orthopedic surgery as indicated for her chronic hip pain.    ----------------------------------------------------------------------------------------------------------------------  I am having Kyley A. Lovena Le maintain her Vitamin D3, zolpidem, cyclobenzaprine, DULoxetine, pantoprazole, cetirizine, polyethylene glycol, levofloxacin, ondansetron, busPIRone, DULoxetine, pantoprazole, Vitamin D (Ergocalciferol), and HYDROcodone-acetaminophen. We administered sodium chloride flush, ropivacaine (PF) 2 mg/mL (0.2%), and lactated ringers. We will continue to administer bupivacaine (PF), fentaNYL, lactated ringers, midazolam, orphenadrine, triamcinolone acetonide, bupivacaine (PF), fentaNYL, lactated ringers, midazolam, orphenadrine, triamcinolone acetonide, bupivacaine (PF), orphenadrine, triamcinolone acetonide, lactated ringers, sodium chloride flush, triamcinolone acetonide, lactated ringers, bupivacaine (PF), orphenadrine, triamcinolone acetonide, and sodium chloride flush.   Meds ordered this encounter  Medications  . DISCONTD: HYDROcodone-acetaminophen (NORCO/VICODIN) 5-325 MG tablet     Sig: Take 1 tablet by mouth 2 (two) times daily.    Dispense:  45 tablet    Refill:  0    Do not fill until [id number]  . HYDROcodone-acetaminophen (NORCO/VICODIN) 5-325 MG tablet    Sig: Take 1 tablet by mouth 2 (two) times daily.    Dispense:  45 tablet    Refill:  0    Do not fill until [id number]  . triamcinolone acetonide (KENALOG-40) injection 40 mg  . sodium chloride flush (NS) 0.9 % injection 10 mL  . ropivacaine (PF) 2 mg/mL (0.2%) (NAROPIN) injection 10 mL  . midazolam (VERSED) injection 5 mg  . lidocaine (PF) (XYLOCAINE) 1 % injection 5 mL  . lactated ringers infusion 1,000 mL  . iopamidol (ISOVUE-M) 41 % intrathecal injection 20 mL  . dexamethasone (DECADRON) injection 10 mg   Patient's Medications  New Prescriptions   No medications on file  Previous Medications   BUSPIRONE (BUSPAR) 10 MG TABLET    Take 10 mg by mouth 3 (three) times daily.   CETIRIZINE (ZYRTEC) 10 MG TABLET    Take 10 mg by mouth.   CHOLECALCIFEROL (VITAMIN D3) 10000 UNITS CAPSULE    Take 50,000 Units by mouth once a week.    CYCLOBENZAPRINE (FLEXERIL) 10 MG TABLET    Limit 1 tab by mouth 2-4 times a day if tolerated   DULOXETINE (CYMBALTA) 20 MG CAPSULE    Limit 1 capsule by mouth per day if tolerated   DULOXETINE (CYMBALTA) 60 MG CAPSULE    Take 60 mg by mouth daily.    LEVOFLOXACIN (LEVAQUIN) 500 MG TABLET    Take 1 tablet by mouth daily.   ONDANSETRON (ZOFRAN-ODT) 4 MG DISINTEGRATING TABLET    Take 4 mg by mouth every 8 (eight) hours as needed for nausea or vomiting.   PANTOPRAZOLE (PROTONIX) 40 MG TABLET    Take 40 mg by mouth daily.    PANTOPRAZOLE (PROTONIX) 40 MG TABLET       POLYETHYLENE GLYCOL (MIRALAX / GLYCOLAX) PACKET       VITAMIN D, ERGOCALCIFEROL, (DRISDOL) 50000 UNITS CAPS CAPSULE       ZOLPIDEM (AMBIEN) 5 MG TABLET    Take 5 mg by mouth at bedtime.  Modified Medications   Modified Medication Previous Medication   HYDROCODONE-ACETAMINOPHEN (NORCO/VICODIN) 5-325 MG TABLET  HYDROcodone-acetaminophen (NORCO/VICODIN) 5-325 MG tablet      Take 1 tablet by  mouth 2 (two) times daily.    Take 1 tablet by mouth 2 (two) times daily.  Discontinued Medications   No medications on file   ----------------------------------------------------------------------------------------------------------------------  Follow-up: Return for evaluation, med refill.   Procedure: L4-5 LESI with fluoroscopic guidance and with moderate sedation  NOTE: The risks, benefits, and expectations of the procedure have been discussed and explained to the patient who was understanding and in agreement with suggested treatment plan. No guarantees were made.  DESCRIPTION OF PROCEDURE: Lumbar epidural steroid injection with 3 mg IV Versed, EKG, blood pressure, pulse, and pulse oximetry monitoring. The procedure was performed with the patient in the prone position under fluoroscopic guidance.  Sterile prep x3 was initiated and I then injected subcutaneous lidocaine to the overlying 4 5 site after its fluoroscopic identifictation.  Using strict aseptic technique, I then advanced an 18-gauge Tuohy epidural needle in the midline using interlaminar approach via loss-of-resistance to saline technique. There was negative aspiration for heme or  CSF.  I then confirmed position with both AP and Lateral fluoroscan.  2 cc of Isovue were injected and a  total of 5 mL of Preservative-Free normal saline mixed with 40 mg of Kenalog and 1cc Ropicaine 0.2 percent were injected incrementally via the  epidurally placed needle. The needle was removed. The patient tolerated the injection well and was convalesced and discharged to home in stable condition. Should the patient have any post procedure difficulty they have been instructed on how to contact us for assistance.   Trigger point injection: The area overlying the aforementioned trigger points were prepped with alcohol. They were then injected with a 25-gauge needle with 4 cc of  ropivacaine 0.2% and Decadron 4 mg at each site after negative aspiration for heme. This was performed after informed consent was obtained and risks and benefits reviewed. She tolerated this procedure without difficulty and was convalesced and discharged to home in stable condition for follow-up as mentioned.  @Rees Matura  Andree Elk, MD@   Molli Barrows, MD

## 2018-04-25 NOTE — Progress Notes (Signed)
Nursing Pain Medication Assessment:  Safety precautions to be maintained throughout the outpatient stay will include: orient to surroundings, keep bed in low position, maintain call bell within reach at all times, provide assistance with transfer out of bed and ambulation.  Medication Inspection Compliance: Pill count conducted under aseptic conditions, in front of the patient. Neither the pills nor the bottle was removed from the patient's sight at any time. Once count was completed pills were immediately returned to the patient in their original bottle.  Medication: Hydrocodone/APAP Pill/Patch Count: 12 of 45 pills remain Pill/Patch Appearance: Markings consistent with prescribed medication Bottle Appearance: Standard pharmacy container. Clearly labeled. Filled Date:10 /18 / 2019 Last Medication intake:  Today

## 2018-04-26 ENCOUNTER — Telehealth: Payer: Self-pay

## 2018-04-26 NOTE — Telephone Encounter (Signed)
Post procedure phone call.  Patient states she is doing well.  

## 2018-05-09 ENCOUNTER — Encounter: Payer: BC Managed Care – PPO | Admitting: Student in an Organized Health Care Education/Training Program

## 2018-06-29 ENCOUNTER — Other Ambulatory Visit: Payer: Self-pay | Admitting: Podiatry

## 2018-06-29 ENCOUNTER — Other Ambulatory Visit (HOSPITAL_COMMUNITY): Payer: Self-pay | Admitting: Podiatry

## 2018-06-29 DIAGNOSIS — M25471 Effusion, right ankle: Secondary | ICD-10-CM

## 2018-06-29 DIAGNOSIS — M25571 Pain in right ankle and joints of right foot: Secondary | ICD-10-CM

## 2018-07-04 ENCOUNTER — Telehealth: Payer: Self-pay | Admitting: *Deleted

## 2018-07-04 NOTE — Telephone Encounter (Signed)
Informed patient that per medication policy, no med refills will be done outside of appointment. Patient agrees to come next week for med refill appointment.

## 2018-07-13 ENCOUNTER — Ambulatory Visit: Payer: BC Managed Care – PPO | Attending: Anesthesiology | Admitting: Anesthesiology

## 2018-07-13 ENCOUNTER — Other Ambulatory Visit: Payer: Self-pay

## 2018-07-13 ENCOUNTER — Encounter: Payer: Self-pay | Admitting: Anesthesiology

## 2018-07-13 VITALS — BP 139/90 | HR 99 | Temp 98.7°F | Resp 18 | Ht 66.0 in | Wt 188.0 lb

## 2018-07-13 DIAGNOSIS — G894 Chronic pain syndrome: Secondary | ICD-10-CM

## 2018-07-13 DIAGNOSIS — M47816 Spondylosis without myelopathy or radiculopathy, lumbar region: Secondary | ICD-10-CM | POA: Diagnosis present

## 2018-07-13 DIAGNOSIS — M47817 Spondylosis without myelopathy or radiculopathy, lumbosacral region: Secondary | ICD-10-CM | POA: Insufficient documentation

## 2018-07-13 DIAGNOSIS — F1190 Opioid use, unspecified, uncomplicated: Secondary | ICD-10-CM | POA: Diagnosis present

## 2018-07-13 DIAGNOSIS — M5136 Other intervertebral disc degeneration, lumbar region: Secondary | ICD-10-CM | POA: Diagnosis present

## 2018-07-13 DIAGNOSIS — M7060 Trochanteric bursitis, unspecified hip: Secondary | ICD-10-CM

## 2018-07-13 DIAGNOSIS — M5441 Lumbago with sciatica, right side: Secondary | ICD-10-CM | POA: Diagnosis present

## 2018-07-13 DIAGNOSIS — M5442 Lumbago with sciatica, left side: Secondary | ICD-10-CM | POA: Diagnosis present

## 2018-07-13 DIAGNOSIS — G8929 Other chronic pain: Secondary | ICD-10-CM | POA: Diagnosis present

## 2018-07-13 MED ORDER — HYDROCODONE-ACETAMINOPHEN 5-325 MG PO TABS: 1.0000 | ORAL_TABLET | Freq: Two times a day (BID) | ORAL | 0 refills | Status: DC

## 2018-07-13 NOTE — Progress Notes (Signed)
Subjective:  Patient ID: Katherine Lester, female    DOB: 03/12/1956  Age: 63 y.o. MRN: [id number]  CC: Back Pain (low)   Procedure: None  HPI Katherine Lester presents for reevaluation.  She was last seen several weeks ago and continues to have primarily right greater than left lower extremity pain.  She had an epidural back in November that gave her approximately 75% lasting up until a few weeks ago.  She gets these periodically for her recalcitrant low back pain with radiation in the bilateral lower extremities and calf pain.  She is considered a nonsurgical candidate and is been doing well with this regimen.  She does her stretching exercises and other conservative measures but has not been able to get adequate pain relief.  The pain she describes as a persistent gnawing aching pain in the low back with radiation into both calves right greater than left periodically.  Otherwise she reports that she is in her usual state of health today.  She is using her hydrocodone approximately 1 to 2 tablets/day for breakthrough pain.  Based on her narcotic assessment sheet this is working well for her with no untoward side effects.  Outpatient Medications Prior to Visit  Medication Sig Dispense Refill  . busPIRone (BUSPAR) 10 MG tablet Take 10 mg by mouth 3 (three) times daily.    . cetirizine (ZYRTEC) 10 MG tablet Take 10 mg by mouth.    . Cholecalciferol (VITAMIN D3) 10000 UNITS capsule Take 50,000 Units by mouth once a week.     . DULoxetine (CYMBALTA) 60 MG capsule Take 60 mg by mouth daily.     . pantoprazole (PROTONIX) 40 MG tablet     . polyethylene glycol (MIRALAX / GLYCOLAX) packet     . zolpidem (AMBIEN) 5 MG tablet Take 5 mg by mouth at bedtime.    Marland Kitchen HYDROcodone-acetaminophen (NORCO/VICODIN) 5-325 MG tablet Take 1 tablet by mouth 2 (two) times daily. 45 tablet 0  . cyclobenzaprine (FLEXERIL) 10 MG tablet Limit 1 tab by mouth 2-4 times a day if tolerated (Patient not taking: Reported on  07/13/2018) 100 tablet 0  . DULoxetine (CYMBALTA) 20 MG capsule Limit 1 capsule by mouth per day if tolerated (Patient not taking: Reported on 01/16/2018) 30 capsule 0  . levofloxacin (LEVAQUIN) 500 MG tablet Take 1 tablet by mouth daily.    Marland Kitchen LORazepam (ATIVAN) 0.5 MG tablet     . ondansetron (ZOFRAN-ODT) 4 MG disintegrating tablet Take 4 mg by mouth every 8 (eight) hours as needed for nausea or vomiting.    . pantoprazole (PROTONIX) 40 MG tablet Take 40 mg by mouth daily.     . Vitamin D, Ergocalciferol, (DRISDOL) 50000 units CAPS capsule      Facility-Administered Medications Prior to Visit  Medication Dose Route Frequency Provider Last Rate Last Dose  . bupivacaine (PF) (MARCAINE) 0.25 % injection 30 mL  30 mL Other Once Mohammed Kindle, MD      . bupivacaine (PF) (MARCAINE) 0.25 % injection 30 mL  30 mL Other Once Mohammed Kindle, MD      . bupivacaine (PF) (MARCAINE) 0.25 % injection 30 mL  30 mL Other Once Mohammed Kindle, MD      . bupivacaine (PF) (MARCAINE) 0.25 % injection 30 mL  30 mL Other Once Mohammed Kindle, MD      . fentaNYL (SUBLIMAZE) injection 100 mcg  100 mcg Intravenous Once Mohammed Kindle, MD      . fentaNYL (SUBLIMAZE) injection 100  mcg  100 mcg Intravenous Once Mohammed Kindle, MD      . lactated ringers infusion 1,000 mL  1,000 mL Intravenous Continuous Mohammed Kindle, MD      . lactated ringers infusion 1,000 mL  1,000 mL Intravenous Continuous Mohammed Kindle, MD      . lactated ringers infusion 1,000 mL  1,000 mL Intravenous Continuous Mohammed Kindle, MD 125 mL/hr at 10/15/15 0923 1,000 mL at 10/15/15 0923  . lactated ringers infusion 1,000 mL  1,000 mL Intravenous Continuous Mohammed Kindle, MD 125 mL/hr at 01/12/16 0950 1,000 mL at 01/12/16 0950  . midazolam (VERSED) 5 MG/5ML injection 5 mg  5 mg Intravenous Once Mohammed Kindle, MD      . midazolam (VERSED) 5 MG/5ML injection 5 mg  5 mg Intravenous Once Mohammed Kindle, MD      . orphenadrine (NORFLEX) injection 60 mg  60  mg Intramuscular Once Mohammed Kindle, MD      . orphenadrine (NORFLEX) injection 60 mg  60 mg Intramuscular Once Mohammed Kindle, MD      . orphenadrine (NORFLEX) injection 60 mg  60 mg Intramuscular Once Mohammed Kindle, MD      . orphenadrine (NORFLEX) injection 60 mg  60 mg Intramuscular Once Mohammed Kindle, MD      . sodium chloride flush (NS) 0.9 % injection 20 mL  20 mL Other Once Mohammed Kindle, MD      . sodium chloride flush (NS) 0.9 % injection 20 mL  20 mL Other Once Mohammed Kindle, MD      . triamcinolone acetonide (KENALOG-40) injection 40 mg  40 mg Other Once Mohammed Kindle, MD      . triamcinolone acetonide (KENALOG-40) injection 40 mg  40 mg Other Once Mohammed Kindle, MD      . triamcinolone acetonide (KENALOG-40) injection 40 mg  40 mg Other Once Mohammed Kindle, MD      . triamcinolone acetonide (KENALOG-40) injection 40 mg  40 mg Other Once Mohammed Kindle, MD      . triamcinolone acetonide (KENALOG-40) injection 40 mg  40 mg Other Once Mohammed Kindle, MD        Review of Systems CNS: No confusion or sedation Cardiac: No angina or palpitations GI: No abdominal pain or constipation Constitutional: No nausea vomiting fevers or chills  Objective:  BP 139/90   Pulse 99   Temp 98.7 F (37.1 C)   Resp 18   Ht 5\' 6"  (1.676 m)   Wt 188 lb (85.3 kg)   SpO2 99%   BMI 30.34 kg/m    BP Readings from Last 3 Encounters:  07/13/18 139/90  04/25/18 120/83  03/15/18 (!) 141/88     Wt Readings from Last 3 Encounters:  07/13/18 188 lb (85.3 kg)  04/25/18 178 lb (80.7 kg)  03/15/18 178 lb (80.7 kg)     Physical Exam Pt is alert and oriented PERRL EOMI HEART IS RRR no murmur or rub MUSCULOSKELETA.  She does have an antalgic gait.  Labs  No results found for: HGBA1C Lab Results  Component Value Date   CREATININE 0.72 03/04/2017    -------------------------------------------------------------------------------------------------------------------- Lab Results   Component Value Date   WBC 12.0 (H) 03/04/2017   HGB 15.3 03/04/2017   HCT 44.2 03/04/2017   PLT 356 03/04/2017   GLUCOSE 114 (H) 03/04/2017   NA 139 03/04/2017   K 3.5 03/04/2017   CL 103 03/04/2017   CREATININE 0.72 03/04/2017   BUN 12 03/04/2017   CO2 26 03/04/2017  TSH 0.590 03/04/2017    --------------------------------------------------------------------------------------------------------------------- Dg C-arm 1-60 Min-no Report  Result Date: 04/25/2018 Fluoroscopy was utilized by the requesting physician.  No radiographic interpretation.     Assessment & Plan:   Katherine Lester was seen today for back pain.  Diagnoses and all orders for this visit:  Chronic bilateral low back pain with bilateral sciatica -     Lumbar Epidural Injection; Future  Degenerative disc disease, lumbar  Lumbosacral spondylosis without myelopathy -     Lumbar Epidural Injection; Future  Chronic pain syndrome  Chronic, continuous use of opioids  Facet syndrome, lumbar  Greater trochanteric bursitis, unspecified laterality  Other orders -     Discontinue: HYDROcodone-acetaminophen (NORCO/VICODIN) 5-325 MG tablet; Take 1 tablet by mouth 2 (two) times daily for 30 days. -     HYDROcodone-acetaminophen (NORCO/VICODIN) 5-325 MG tablet; Take 1 tablet by mouth 2 (two) times daily for 30 days.        ----------------------------------------------------------------------------------------------------------------------  Problem List Items Addressed This Visit      Unprioritized   Degenerative disc disease, lumbar   Relevant Medications   HYDROcodone-acetaminophen (NORCO/VICODIN) 5-325 MG tablet   Facet syndrome, lumbar   Relevant Medications   HYDROcodone-acetaminophen (NORCO/VICODIN) 5-325 MG tablet   Greater trochanteric bursitis    Other Visit Diagnoses    Chronic bilateral low back pain with bilateral sciatica    -  Primary   Relevant Medications   LORazepam (ATIVAN) 0.5 MG  tablet   HYDROcodone-acetaminophen (NORCO/VICODIN) 5-325 MG tablet   Other Relevant Orders   Lumbar Epidural Injection   Lumbosacral spondylosis without myelopathy       Relevant Medications   HYDROcodone-acetaminophen (NORCO/VICODIN) 5-325 MG tablet   Other Relevant Orders   Lumbar Epidural Injection   Chronic pain syndrome       Chronic, continuous use of opioids            ----------------------------------------------------------------------------------------------------------------------  1. Chronic bilateral low back pain with bilateral sciatica We will plan on return to clinic as scheduled in 1 week for an epidural injection.  She has had these in the past and done well with these and unfortunately her pain recurs.  However she does get good relief with the epidurals and this is keeping her pain under reasonable control.  She is also tolerating her medications without difficulty.  Refills will be given for today for the next month with twice daily dosing on the Vicodin. - Lumbar Epidural Injection; Future  2. Degenerative disc disease, lumbar As above  3. Lumbosacral spondylosis without myelopathy As above - Lumbar Epidural Injection; Future  4. Chronic pain syndrome We have reviewed the [facility] practitioner database information and it is appropriate.  5. Chronic, continuous use of opioids As above  6. Facet syndrome, lumbar   7. Greater trochanteric bursitis, unspecified laterality     ----------------------------------------------------------------------------------------------------------------------  I am having Katherine Lester maintain her Vitamin D3, zolpidem, cyclobenzaprine, DULoxetine, pantoprazole, cetirizine, polyethylene glycol, levofloxacin, ondansetron, busPIRone, DULoxetine, pantoprazole, Vitamin D (Ergocalciferol), LORazepam, and HYDROcodone-acetaminophen. We will continue to administer bupivacaine (PF), fentaNYL, lactated ringers,  midazolam, orphenadrine, triamcinolone acetonide, bupivacaine (PF), fentaNYL, lactated ringers, midazolam, orphenadrine, triamcinolone acetonide, bupivacaine (PF), orphenadrine, triamcinolone acetonide, lactated ringers, sodium chloride flush, triamcinolone acetonide, lactated ringers, bupivacaine (PF), orphenadrine, triamcinolone acetonide, and sodium chloride flush.   Meds ordered this encounter  Medications  . DISCONTD: HYDROcodone-acetaminophen (NORCO/VICODIN) 5-325 MG tablet    Sig: Take 1 tablet by mouth 2 (two) times daily for 30 days.    Dispense:  60 tablet    Refill:  0  . HYDROcodone-acetaminophen (NORCO/VICODIN) 5-325 MG tablet    Sig: Take 1 tablet by mouth 2 (two) times daily for 30 days.    Dispense:  60 tablet    Refill:  0   Patient's Medications  New Prescriptions   No medications on file  Previous Medications   BUSPIRONE (BUSPAR) 10 MG TABLET    Take 10 mg by mouth 3 (three) times daily.   CETIRIZINE (ZYRTEC) 10 MG TABLET    Take 10 mg by mouth.   CHOLECALCIFEROL (VITAMIN D3) 10000 UNITS CAPSULE    Take 50,000 Units by mouth once a week.    CYCLOBENZAPRINE (FLEXERIL) 10 MG TABLET    Limit 1 tab by mouth 2-4 times a day if tolerated   DULOXETINE (CYMBALTA) 20 MG CAPSULE    Limit 1 capsule by mouth per day if tolerated   DULOXETINE (CYMBALTA) 60 MG CAPSULE    Take 60 mg by mouth daily.    LEVOFLOXACIN (LEVAQUIN) 500 MG TABLET    Take 1 tablet by mouth daily.   LORAZEPAM (ATIVAN) 0.5 MG TABLET       ONDANSETRON (ZOFRAN-ODT) 4 MG DISINTEGRATING TABLET    Take 4 mg by mouth every 8 (eight) hours as needed for nausea or vomiting.   PANTOPRAZOLE (PROTONIX) 40 MG TABLET    Take 40 mg by mouth daily.    PANTOPRAZOLE (PROTONIX) 40 MG TABLET       POLYETHYLENE GLYCOL (MIRALAX / GLYCOLAX) PACKET       VITAMIN D, ERGOCALCIFEROL, (DRISDOL) 50000 UNITS CAPS CAPSULE       ZOLPIDEM (AMBIEN) 5 MG TABLET    Take 5 mg by mouth at bedtime.  Modified Medications   Modified Medication  Previous Medication   HYDROCODONE-ACETAMINOPHEN (NORCO/VICODIN) 5-325 MG TABLET HYDROcodone-acetaminophen (NORCO/VICODIN) 5-325 MG tablet      Take 1 tablet by mouth 2 (two) times daily for 30 days.    Take 1 tablet by mouth 2 (two) times daily.  Discontinued Medications   No medications on file   ----------------------------------------------------------------------------------------------------------------------  Follow-up: Return in about 1 week (around 07/20/2018) for evaluation, procedure.    Molli Barrows, MD

## 2018-07-13 NOTE — Progress Notes (Signed)
Nursing Pain Medication Assessment:  Safety precautions to be maintained throughout the outpatient stay will include: orient to surroundings, keep bed in low position, maintain call bell within reach at all times, provide assistance with transfer out of bed and ambulation.  Medication Inspection Compliance: Ms. Loyer did not comply with our request to bring her pills to be counted. She was reminded that bringing the medication bottles, even when empty, is a requirement.  Medication: None brought in. Pill/Patch Count: None available to be counted. Bottle Appearance: No container available. Did not bring bottle(s) to appointment. Filled Date: N/A Last Medication intake:  Yesterday

## 2018-07-13 NOTE — Patient Instructions (Addendum)
You were given one prescription for Hydrocodone.

## 2018-07-20 ENCOUNTER — Other Ambulatory Visit: Payer: Self-pay | Admitting: Anesthesiology

## 2018-07-20 ENCOUNTER — Ambulatory Visit (HOSPITAL_BASED_OUTPATIENT_CLINIC_OR_DEPARTMENT_OTHER): Payer: BC Managed Care – PPO | Admitting: Anesthesiology

## 2018-07-20 ENCOUNTER — Encounter: Payer: Self-pay | Admitting: Anesthesiology

## 2018-07-20 ENCOUNTER — Ambulatory Visit
Admission: RE | Admit: 2018-07-20 | Discharge: 2018-07-20 | Disposition: A | Discharge status: Home / Self Care | Payer: BC Managed Care – PPO | Source: Ambulatory Visit | Attending: Anesthesiology | Admitting: Anesthesiology

## 2018-07-20 ENCOUNTER — Other Ambulatory Visit: Payer: Self-pay

## 2018-07-20 VITALS — BP 138/72 | HR 77 | Temp 98.5°F | Resp 20 | Ht 66.0 in | Wt 188.0 lb

## 2018-07-20 DIAGNOSIS — M5136 Other intervertebral disc degeneration, lumbar region: Secondary | ICD-10-CM | POA: Insufficient documentation

## 2018-07-20 DIAGNOSIS — M47816 Spondylosis without myelopathy or radiculopathy, lumbar region: Secondary | ICD-10-CM

## 2018-07-20 DIAGNOSIS — M79604 Pain in right leg: Secondary | ICD-10-CM | POA: Diagnosis present

## 2018-07-20 DIAGNOSIS — G8929 Other chronic pain: Secondary | ICD-10-CM | POA: Insufficient documentation

## 2018-07-20 DIAGNOSIS — M47817 Spondylosis without myelopathy or radiculopathy, lumbosacral region: Secondary | ICD-10-CM | POA: Diagnosis present

## 2018-07-20 DIAGNOSIS — M5442 Lumbago with sciatica, left side: Secondary | ICD-10-CM | POA: Insufficient documentation

## 2018-07-20 DIAGNOSIS — F1190 Opioid use, unspecified, uncomplicated: Secondary | ICD-10-CM | POA: Insufficient documentation

## 2018-07-20 DIAGNOSIS — R52 Pain, unspecified: Secondary | ICD-10-CM

## 2018-07-20 DIAGNOSIS — M5441 Lumbago with sciatica, right side: Secondary | ICD-10-CM

## 2018-07-20 DIAGNOSIS — M51369 Other intervertebral disc degeneration, lumbar region without mention of lumbar back pain or lower extremity pain: Secondary | ICD-10-CM

## 2018-07-20 DIAGNOSIS — G894 Chronic pain syndrome: Secondary | ICD-10-CM | POA: Diagnosis present

## 2018-07-20 MED ORDER — SODIUM CHLORIDE 0.9% FLUSH
10.0000 mL | Freq: Once | INTRAVENOUS | Status: AC
  Administered 2018-07-20: 5 mL

## 2018-07-20 MED ORDER — IOPAMIDOL (ISOVUE-M 200) INJECTION 41%
20.0000 mL | Freq: Once | INTRAMUSCULAR | Status: DC | PRN
  Administered 2018-07-20: 10 mL
  Filled 2018-07-20: qty 20

## 2018-07-20 MED ORDER — IOPAMIDOL (ISOVUE-M 200) INJECTION 41%
INTRAMUSCULAR | Status: AC
  Filled 2018-07-20: qty 10

## 2018-07-20 MED ORDER — HYDROCODONE-ACETAMINOPHEN 5-325 MG PO TABS: 1.0000 | ORAL_TABLET | Freq: Two times a day (BID) | ORAL | 0 refills | Status: AC

## 2018-07-20 MED ORDER — ROPIVACAINE HCL 2 MG/ML IJ SOLN
10.0000 mL | Freq: Once | INTRAMUSCULAR | Status: AC
  Administered 2018-07-20: 1 mL via EPIDURAL
  Filled 2018-07-20: qty 10

## 2018-07-20 MED ORDER — SODIUM CHLORIDE (PF) 0.9 % IJ SOLN
INTRAMUSCULAR | Status: AC
  Filled 2018-07-20: qty 10

## 2018-07-20 MED ORDER — LIDOCAINE HCL (PF) 1 % IJ SOLN
5.0000 mL | Freq: Once | INTRAMUSCULAR | Status: AC
  Administered 2018-07-20: 5 mL via SUBCUTANEOUS
  Filled 2018-07-20: qty 5

## 2018-07-20 MED ORDER — MIDAZOLAM HCL 5 MG/5ML IJ SOLN
5.0000 mg | Freq: Once | INTRAMUSCULAR | Status: AC
  Administered 2018-07-20: 4 mg via INTRAVENOUS
  Filled 2018-07-20: qty 5

## 2018-07-20 MED ORDER — TRIAMCINOLONE ACETONIDE 40 MG/ML IJ SUSP
40.0000 mg | Freq: Once | INTRAMUSCULAR | Status: AC
  Administered 2018-07-20: 40 mg
  Filled 2018-07-20: qty 1

## 2018-07-20 MED ORDER — LACTATED RINGERS IV SOLN
1000.0000 mL | INTRAVENOUS | Status: DC
  Administered 2018-07-20: 1000 mL via INTRAVENOUS

## 2018-07-20 NOTE — Progress Notes (Signed)
Subjective:  Patient ID: Katherine Lester, female    DOB: [birth year]  Age: 63 y.o. MRN: [id number]  CC: Back Pain and Procedure   Procedure: L5-S1 epidural steroid under fluoroscopic guidance with moderate sedation  HPI Katherine Lester presents for reevaluation.  She was last seen several days ago and presents today for an epidural injection.  She presents with primarily right side greater than left lower leg pain with cramping in the right calf greater than left.  She also has back spasming.  She receives periodic epidural steroid injections and these have worked well for her generally giving her approximately 60 to 70% relief lasting 2 months or more.  She receives these periodically throughout the year secondary to failure to gain relief of her pain with conservative management.  She has been through physical therapy and anti-inflammatory medication without much improvement.  She still takes her Vicodin 5 mg tablets giving her good relief as well of her low back pain and this continues to work effectively for her.  She is due for another refill in March.  No other changes in lower extremity strength or function are noted from previous evaluation.  Best on her narcotic assessment sheet she is continuing to get good relief with her medicines.  Outpatient Medications Prior to Visit  Medication Sig Dispense Refill  . busPIRone (BUSPAR) 10 MG tablet Take 10 mg by mouth 3 (three) times daily.    . cetirizine (ZYRTEC) 10 MG tablet Take 10 mg by mouth.    . Cholecalciferol (VITAMIN D3) 10000 UNITS capsule Take 50,000 Units by mouth once a week.     . cyclobenzaprine (FLEXERIL) 10 MG tablet Limit 1 tab by mouth 2-4 times a day if tolerated (Patient not taking: Reported on 07/13/2018) 100 tablet 0  . DULoxetine (CYMBALTA) 20 MG capsule Limit 1 capsule by mouth per day if tolerated (Patient not taking: Reported on 01/16/2018) 30 capsule 0  . DULoxetine (CYMBALTA) 60 MG capsule Take 60 mg by mouth  daily.     Marland Kitchen levofloxacin (LEVAQUIN) 500 MG tablet Take 1 tablet by mouth daily.    Marland Kitchen LORazepam (ATIVAN) 0.5 MG tablet     . ondansetron (ZOFRAN-ODT) 4 MG disintegrating tablet Take 4 mg by mouth every 8 (eight) hours as needed for nausea or vomiting.    . pantoprazole (PROTONIX) 40 MG tablet Take 40 mg by mouth daily.     . pantoprazole (PROTONIX) 40 MG tablet     . polyethylene glycol (MIRALAX / GLYCOLAX) packet     . Vitamin D, Ergocalciferol, (DRISDOL) 50000 units CAPS capsule     . zolpidem (AMBIEN) 5 MG tablet Take 5 mg by mouth at bedtime.    Marland Kitchen HYDROcodone-acetaminophen (NORCO/VICODIN) 5-325 MG tablet Take 1 tablet by mouth 2 (two) times daily for 30 days. 60 tablet 0   Facility-Administered Medications Prior to Visit  Medication Dose Route Frequency Provider Last Rate Last Dose  . bupivacaine (PF) (MARCAINE) 0.25 % injection 30 mL  30 mL Other Once Mohammed Kindle, MD      . bupivacaine (PF) (MARCAINE) 0.25 % injection 30 mL  30 mL Other Once Mohammed Kindle, MD      . bupivacaine (PF) (MARCAINE) 0.25 % injection 30 mL  30 mL Other Once Mohammed Kindle, MD      . bupivacaine (PF) (MARCAINE) 0.25 % injection 30 mL  30 mL Other Once Mohammed Kindle, MD      . fentaNYL (SUBLIMAZE) injection 100 mcg  100 mcg Intravenous Once Mohammed Kindle, MD      . fentaNYL (SUBLIMAZE) injection 100 mcg  100 mcg Intravenous Once Mohammed Kindle, MD      . lactated ringers infusion 1,000 mL  1,000 mL Intravenous Continuous Mohammed Kindle, MD      . lactated ringers infusion 1,000 mL  1,000 mL Intravenous Continuous Mohammed Kindle, MD      . lactated ringers infusion 1,000 mL  1,000 mL Intravenous Continuous Mohammed Kindle, MD 125 mL/hr at 10/15/15 0923 1,000 mL at 10/15/15 0923  . lactated ringers infusion 1,000 mL  1,000 mL Intravenous Continuous Mohammed Kindle, MD 125 mL/hr at 01/12/16 0950 1,000 mL at 01/12/16 0950  . midazolam (VERSED) 5 MG/5ML injection 5 mg  5 mg Intravenous Once Mohammed Kindle, MD       . midazolam (VERSED) 5 MG/5ML injection 5 mg  5 mg Intravenous Once Mohammed Kindle, MD      . orphenadrine (NORFLEX) injection 60 mg  60 mg Intramuscular Once Mohammed Kindle, MD      . orphenadrine (NORFLEX) injection 60 mg  60 mg Intramuscular Once Mohammed Kindle, MD      . orphenadrine (NORFLEX) injection 60 mg  60 mg Intramuscular Once Mohammed Kindle, MD      . orphenadrine (NORFLEX) injection 60 mg  60 mg Intramuscular Once Mohammed Kindle, MD      . sodium chloride flush (NS) 0.9 % injection 20 mL  20 mL Other Once Mohammed Kindle, MD      . sodium chloride flush (NS) 0.9 % injection 20 mL  20 mL Other Once Mohammed Kindle, MD      . triamcinolone acetonide (KENALOG-40) injection 40 mg  40 mg Other Once Mohammed Kindle, MD      . triamcinolone acetonide (KENALOG-40) injection 40 mg  40 mg Other Once Mohammed Kindle, MD      . triamcinolone acetonide (KENALOG-40) injection 40 mg  40 mg Other Once Mohammed Kindle, MD      . triamcinolone acetonide (KENALOG-40) injection 40 mg  40 mg Other Once Mohammed Kindle, MD      . triamcinolone acetonide (KENALOG-40) injection 40 mg  40 mg Other Once Mohammed Kindle, MD        Review of Systems CNS: No confusion or sedation Cardiac: No angina or palpitations GI: No abdominal pain or constipation Constitutional: No nausea vomiting fevers or chills  Objective:  BP 119/76   Pulse 77   Temp 98.5 F (36.9 C)   Resp 16   Ht 5\' 6"  (1.676 m)   Wt 188 lb (85.3 kg)   SpO2 98%   BMI 30.34 kg/m    BP Readings from Last 3 Encounters:  07/20/18 119/76  07/13/18 139/90  04/25/18 120/83     Wt Readings from Last 3 Encounters:  07/20/18 188 lb (85.3 kg)  07/13/18 188 lb (85.3 kg)  04/25/18 178 lb (80.7 kg)     Physical Exam Pt is alert and oriented PERRL EOMI HEART IS RRR no murmur or rub LCTA no wheezing or rales MUSCULOSKELETAL reveals a positive straight leg raise on the right side.  Muscle tone and bulk is at baseline.  She has a mildly  antalgic gait  Labs  No results found for: HGBA1C Lab Results  Component Value Date   CREATININE 0.72 03/04/2017    -------------------------------------------------------------------------------------------------------------------- Lab Results  Component Value Date   WBC 12.0 (H) 03/04/2017   HGB 15.3 03/04/2017   HCT 44.2 03/04/2017  PLT 356 03/04/2017   GLUCOSE 114 (H) 03/04/2017   NA 139 03/04/2017   K 3.5 03/04/2017   CL 103 03/04/2017   CREATININE 0.72 03/04/2017   BUN 12 03/04/2017   CO2 26 03/04/2017   TSH 0.590 03/04/2017    --------------------------------------------------------------------------------------------------------------------- Dg C-arm 1-60 Min-no Report  Result Date: 07/20/2018 Fluoroscopy was utilized by the requesting physician.  No radiographic interpretation.     Assessment & Plan:   Katherine Lester was seen today for back pain and procedure.  Diagnoses and all orders for this visit:  Chronic bilateral low back pain with bilateral sciatica -     Lumbar Epidural Injection; Future  Degenerative disc disease, lumbar -     Lumbar Epidural Injection; Future  Lumbosacral spondylosis without myelopathy -     Lumbar Epidural Injection; Future  Chronic pain syndrome  Chronic, continuous use of opioids  Facet syndrome, lumbar  Leg pain, lateral, right -     Lumbar Epidural Injection; Future  Other orders -     HYDROcodone-acetaminophen (NORCO/VICODIN) 5-325 MG tablet; Take 1 tablet by mouth 2 (two) times daily for 30 days. -     triamcinolone acetonide (KENALOG-40) injection 40 mg -     sodium chloride flush (NS) 0.9 % injection 10 mL -     ropivacaine (PF) 2 mg/mL (0.2%) (NAROPIN) injection 10 mL -     midazolam (VERSED) 5 MG/5ML injection 5 mg -     lidocaine (PF) (XYLOCAINE) 1 % injection 5 mL -     lactated ringers infusion 1,000 mL -     iopamidol (ISOVUE-M) 41 % intrathecal injection 20  mL        ----------------------------------------------------------------------------------------------------------------------  Problem List Items Addressed This Visit      Unprioritized   Degenerative disc disease, lumbar   Relevant Medications   HYDROcodone-acetaminophen (NORCO/VICODIN) 5-325 MG tablet (Start on 08/13/2018)   triamcinolone acetonide (KENALOG-40) injection 40 mg (Completed)   Other Relevant Orders   Lumbar Epidural Injection   Facet syndrome, lumbar   Relevant Medications   HYDROcodone-acetaminophen (NORCO/VICODIN) 5-325 MG tablet (Start on 08/13/2018)   triamcinolone acetonide (KENALOG-40) injection 40 mg (Completed)    Other Visit Diagnoses    Chronic bilateral low back pain with bilateral sciatica    -  Primary   Relevant Medications   HYDROcodone-acetaminophen (NORCO/VICODIN) 5-325 MG tablet (Start on 08/13/2018)   triamcinolone acetonide (KENALOG-40) injection 40 mg (Completed)   midazolam (VERSED) 5 MG/5ML injection 5 mg (Completed)   Other Relevant Orders   Lumbar Epidural Injection   Lumbosacral spondylosis without myelopathy       Relevant Medications   HYDROcodone-acetaminophen (NORCO/VICODIN) 5-325 MG tablet (Start on 08/13/2018)   triamcinolone acetonide (KENALOG-40) injection 40 mg (Completed)   Other Relevant Orders   Lumbar Epidural Injection   Chronic pain syndrome       Chronic, continuous use of opioids       Leg pain, lateral, right       Relevant Orders   Lumbar Epidural Injection        ----------------------------------------------------------------------------------------------------------------------  1. Chronic bilateral low back pain with bilateral sciatica We will proceed with a repeat epidural injection today.  We have reviewed the risks and benefits of the procedure in full detail and all questions are answered.  I am going to have him return to clinic in 3 months for reevaluation.  In the meantime she is to continue with  stretching strengthening exercises as prescribed. - Lumbar Epidural Injection; Future  2.  Degenerative disc disease, lumbar As above - Lumbar Epidural Injection; Future  3. Lumbosacral spondylosis without myelopathy As above - Lumbar Epidural Injection; Future  4. Chronic pain syndrome Refill will be given for March 15 for her Vicodin.  5. Chronic, continuous use of opioids Continue current medication therapy with refill given for March 15.  We have reviewed the [facility] practitioner database information and it is appropriate.  6. Facet syndrome, lumbar As above  7. Leg pain, lateral, right As above and continue core stretching and strengthening - Lumbar Epidural Injection; Future    ----------------------------------------------------------------------------------------------------------------------  I am having Katherine Lester maintain her Vitamin D3, zolpidem, cyclobenzaprine, DULoxetine, pantoprazole, cetirizine, polyethylene glycol, levofloxacin, ondansetron, busPIRone, DULoxetine, pantoprazole, Vitamin D (Ergocalciferol), LORazepam, and HYDROcodone-acetaminophen. We administered triamcinolone acetonide, sodium chloride flush, ropivacaine (PF) 2 mg/mL (0.2%), midazolam, lidocaine (PF), lactated ringers, and iopamidol. We will continue to administer bupivacaine (PF), fentaNYL, lactated ringers, midazolam, orphenadrine, triamcinolone acetonide, bupivacaine (PF), fentaNYL, lactated ringers, midazolam, orphenadrine, triamcinolone acetonide, bupivacaine (PF), orphenadrine, triamcinolone acetonide, lactated ringers, sodium chloride flush, triamcinolone acetonide, lactated ringers, bupivacaine (PF), orphenadrine, triamcinolone acetonide, and sodium chloride flush.   Meds ordered this encounter  Medications  . HYDROcodone-acetaminophen (NORCO/VICODIN) 5-325 MG tablet    Sig: Take 1 tablet by mouth 2 (two) times daily for 30 days.    Dispense:  60 tablet    Refill:  0    30  day supply no early refills  . triamcinolone acetonide (KENALOG-40) injection 40 mg  . sodium chloride flush (NS) 0.9 % injection 10 mL  . ropivacaine (PF) 2 mg/mL (0.2%) (NAROPIN) injection 10 mL  . midazolam (VERSED) 5 MG/5ML injection 5 mg  . lidocaine (PF) (XYLOCAINE) 1 % injection 5 mL  . lactated ringers infusion 1,000 mL  . iopamidol (ISOVUE-M) 41 % intrathecal injection 20 mL   Patient's Medications  New Prescriptions   No medications on file  Previous Medications   BUSPIRONE (BUSPAR) 10 MG TABLET    Take 10 mg by mouth 3 (three) times daily.   CETIRIZINE (ZYRTEC) 10 MG TABLET    Take 10 mg by mouth.   CHOLECALCIFEROL (VITAMIN D3) 10000 UNITS CAPSULE    Take 50,000 Units by mouth once a week.    CYCLOBENZAPRINE (FLEXERIL) 10 MG TABLET    Limit 1 tab by mouth 2-4 times a day if tolerated   DULOXETINE (CYMBALTA) 20 MG CAPSULE    Limit 1 capsule by mouth per day if tolerated   DULOXETINE (CYMBALTA) 60 MG CAPSULE    Take 60 mg by mouth daily.    LEVOFLOXACIN (LEVAQUIN) 500 MG TABLET    Take 1 tablet by mouth daily.   LORAZEPAM (ATIVAN) 0.5 MG TABLET       ONDANSETRON (ZOFRAN-ODT) 4 MG DISINTEGRATING TABLET    Take 4 mg by mouth every 8 (eight) hours as needed for nausea or vomiting.   PANTOPRAZOLE (PROTONIX) 40 MG TABLET    Take 40 mg by mouth daily.    PANTOPRAZOLE (PROTONIX) 40 MG TABLET       POLYETHYLENE GLYCOL (MIRALAX / GLYCOLAX) PACKET       VITAMIN D, ERGOCALCIFEROL, (DRISDOL) 50000 UNITS CAPS CAPSULE       ZOLPIDEM (AMBIEN) 5 MG TABLET    Take 5 mg by mouth at bedtime.  Modified Medications   Modified Medication Previous Medication   HYDROCODONE-ACETAMINOPHEN (NORCO/VICODIN) 5-325 MG TABLET HYDROcodone-acetaminophen (NORCO/VICODIN) 5-325 MG tablet      Take 1 tablet by mouth 2 (two) times daily  for 30 days.    Take 1 tablet by mouth 2 (two) times daily for 30 days.  Discontinued Medications   No medications on file    ----------------------------------------------------------------------------------------------------------------------  Follow-up: Return in about 3 months (around 10/18/2018) for evaluation, med refill, procedure.   Procedure: L5-S1 LESI with fluoroscopic guidance and with moderate sedation  NOTE: The risks, benefits, and expectations of the procedure have been discussed and explained to the patient who was understanding and in agreement with suggested treatment plan. No guarantees were made.  DESCRIPTION OF PROCEDURE: Lumbar epidural steroid injection with 3 mg IV Versed, EKG, blood pressure, pulse, and pulse oximetry monitoring. The procedure was performed with the patient in the prone position under fluoroscopic guidance.  Sterile prep x3 was initiated and I then injected subcutaneous lidocaine to the overlying L 5 S1 site after its fluoroscopic identifictation.  Using strict aseptic technique, I then advanced an 18-gauge Tuohy epidural needle in the midline using interlaminar approach via loss-of-resistance to saline technique. There was negative aspiration for heme or  CSF.  I then confirmed position with both AP and Lateral fluoroscan.  2 cc of Isovue were injected and a  total of 5 mL of Preservative-Free normal saline mixed with 40 mg of Kenalog and 1cc Ropicaine 0.2 percent were injected incrementally via the  epidurally placed needle. The needle was removed. The patient tolerated the injection well and was convalesced and discharged to home in stable condition. Should the patient have any post procedure difficulty they have been instructed on how to contact us for assistance.    Molli Barrows, MD

## 2018-07-20 NOTE — Progress Notes (Signed)
Safety precautions to be maintained throughout the outpatient stay will include: orient to surroundings, keep bed in low position, maintain call bell within reach at all times, provide assistance with transfer out of bed and ambulation.  

## 2018-08-03 ENCOUNTER — Ambulatory Visit: Payer: BC Managed Care – PPO | Admitting: Anesthesiology

## 2018-08-09 ENCOUNTER — Other Ambulatory Visit: Payer: Self-pay | Admitting: Family Medicine

## 2018-08-09 DIAGNOSIS — Z1231 Encounter for screening mammogram for malignant neoplasm of breast: Secondary | ICD-10-CM

## 2018-10-02 ENCOUNTER — Ambulatory Visit: Payer: BC Managed Care – PPO | Attending: Anesthesiology | Admitting: Anesthesiology

## 2018-10-02 ENCOUNTER — Other Ambulatory Visit: Payer: Self-pay

## 2018-10-10 ENCOUNTER — Ambulatory Visit: Payer: BC Managed Care – PPO | Attending: Anesthesiology | Admitting: Anesthesiology

## 2018-10-10 ENCOUNTER — Other Ambulatory Visit: Payer: Self-pay

## 2018-10-10 DIAGNOSIS — M51369 Other intervertebral disc degeneration, lumbar region without mention of lumbar back pain or lower extremity pain: Secondary | ICD-10-CM

## 2018-10-10 DIAGNOSIS — G894 Chronic pain syndrome: Secondary | ICD-10-CM

## 2018-10-10 DIAGNOSIS — M5441 Lumbago with sciatica, right side: Secondary | ICD-10-CM

## 2018-10-10 DIAGNOSIS — M47816 Spondylosis without myelopathy or radiculopathy, lumbar region: Secondary | ICD-10-CM

## 2018-10-10 DIAGNOSIS — M5136 Other intervertebral disc degeneration, lumbar region: Secondary | ICD-10-CM | POA: Diagnosis not present

## 2018-10-10 DIAGNOSIS — F1190 Opioid use, unspecified, uncomplicated: Secondary | ICD-10-CM

## 2018-10-10 DIAGNOSIS — M47817 Spondylosis without myelopathy or radiculopathy, lumbosacral region: Secondary | ICD-10-CM | POA: Diagnosis not present

## 2018-10-10 DIAGNOSIS — M5442 Lumbago with sciatica, left side: Secondary | ICD-10-CM

## 2018-10-10 DIAGNOSIS — G8929 Other chronic pain: Secondary | ICD-10-CM

## 2018-10-11 ENCOUNTER — Encounter: Payer: Self-pay | Admitting: Anesthesiology

## 2018-10-11 MED ORDER — HYDROCODONE-ACETAMINOPHEN 5-325 MG PO TABS: 1.0000 | ORAL_TABLET | Freq: Four times a day (QID) | ORAL | 0 refills | Status: AC | PRN

## 2018-10-11 NOTE — Progress Notes (Signed)
Virtual Visit via Video Note  I connected with Katherine Lester on 10/11/18 at 11:45 AM EDT by a video enabled telemedicine application and verified that I am speaking with the correct person using two identifiers.  Location: Patient: Home Provider: Pain control center   I discussed the limitations of evaluation and management by telemedicine and the availability of in person appointments. The patient expressed understanding and agreed to proceed.  History of Present Illness: I spoke with Katherine Lester via Administrator in reference to her low back pain and leg pain.  She was last seen in the pain control center back in February and had an epidural at that time.  She reports having good relief with her last epidural and rated this at 75 to 80% lasting approximately 8 weeks.  Unfortunately with the most recent COVID crisis her job has changed requiring more heavy lifting and this has caused recurrence of her low back pain and leg pain.  She is getting more pain and numbness and tingling into the left leg and left calf.  No new weakness is reported her bowel and bladder function has been stable.  She reports that her last MRI was approximately greater than 5 years ago.  She is taking her hydrocodone more frequently secondary to the pain but her last prescription was in March.  Furthermore she cannot take anti-inflammatories secondary to recurrent gastric ulcer disease.  She gets good relief with her medications and has improved overall lifestyle function with those medicines and no side effects reported    Observations/Objective:  Current Outpatient Medications:  .  busPIRone (BUSPAR) 10 MG tablet, Take 10 mg by mouth 3 (three) times daily., Disp: , Rfl:  .  cetirizine (ZYRTEC) 10 MG tablet, Take 10 mg by mouth., Disp: , Rfl:  .  Cholecalciferol (VITAMIN D3) 10000 UNITS capsule, Take 50,000 Units by mouth once a week. , Disp: , Rfl:  .  cyclobenzaprine (FLEXERIL) 10 MG tablet, Limit 1 tab by mouth  2-4 times a day if tolerated (Patient not taking: Reported on 07/13/2018), Disp: 100 tablet, Rfl: 0 .  DULoxetine (CYMBALTA) 20 MG capsule, Limit 1 capsule by mouth per day if tolerated (Patient not taking: Reported on 01/16/2018), Disp: 30 capsule, Rfl: 0 .  DULoxetine (CYMBALTA) 60 MG capsule, Take 60 mg by mouth daily. , Disp: , Rfl:  .  HYDROcodone-acetaminophen (NORCO/VICODIN) 5-325 MG tablet, Take 1 tablet by mouth every 6 (six) hours as needed for up to 30 days for moderate pain or severe pain., Disp: 60 tablet, Rfl: 0 .  [START ON 11/10/2018] HYDROcodone-acetaminophen (NORCO/VICODIN) 5-325 MG tablet, Take 1 tablet by mouth every 6 (six) hours as needed for up to 30 days for moderate pain or severe pain., Disp: 60 tablet, Rfl: 0 .  levofloxacin (LEVAQUIN) 500 MG tablet, Take 1 tablet by mouth daily., Disp: , Rfl:  .  LORazepam (ATIVAN) 0.5 MG tablet, , Disp: , Rfl:  .  ondansetron (ZOFRAN-ODT) 4 MG disintegrating tablet, Take 4 mg by mouth every 8 (eight) hours as needed for nausea or vomiting., Disp: , Rfl:  .  pantoprazole (PROTONIX) 40 MG tablet, Take 40 mg by mouth daily. , Disp: , Rfl:  .  pantoprazole (PROTONIX) 40 MG tablet, , Disp: , Rfl:  .  polyethylene glycol (MIRALAX / GLYCOLAX) packet, , Disp: , Rfl:  .  Vitamin D, Ergocalciferol, (DRISDOL) 50000 units CAPS capsule, , Disp: , Rfl:  .  zolpidem (AMBIEN) 5 MG tablet, Take 5 mg by mouth  at bedtime., Disp: , Rfl:   Current Facility-Administered Medications:  .  bupivacaine (PF) (MARCAINE) 0.25 % injection 30 mL, 30 mL, Other, Once, Mohammed Kindle, MD .  bupivacaine (PF) (MARCAINE) 0.25 % injection 30 mL, 30 mL, Other, Once, Mohammed Kindle, MD .  bupivacaine (PF) (MARCAINE) 0.25 % injection 30 mL, 30 mL, Other, Once, Mohammed Kindle, MD .  bupivacaine (PF) (MARCAINE) 0.25 % injection 30 mL, 30 mL, Other, Once, Mohammed Kindle, MD .  fentaNYL (SUBLIMAZE) injection 100 mcg, 100 mcg, Intravenous, Once, Mohammed Kindle, MD .  fentaNYL  (SUBLIMAZE) injection 100 mcg, 100 mcg, Intravenous, Once, Mohammed Kindle, MD .  lactated ringers infusion 1,000 mL, 1,000 mL, Intravenous, Continuous, Mohammed Kindle, MD .  lactated ringers infusion 1,000 mL, 1,000 mL, Intravenous, Continuous, Mohammed Kindle, MD .  lactated ringers infusion 1,000 mL, 1,000 mL, Intravenous, Continuous, Mohammed Kindle, MD, Last Rate: 125 mL/hr at 10/15/15 0923, 1,000 mL at 10/15/15 0923 .  lactated ringers infusion 1,000 mL, 1,000 mL, Intravenous, Continuous, Mohammed Kindle, MD, Last Rate: 125 mL/hr at 01/12/16 0950, 1,000 mL at 01/12/16 0950 .  midazolam (VERSED) 5 MG/5ML injection 5 mg, 5 mg, Intravenous, Once, Mohammed Kindle, MD .  midazolam (VERSED) 5 MG/5ML injection 5 mg, 5 mg, Intravenous, Once, Mohammed Kindle, MD .  orphenadrine (NORFLEX) injection 60 mg, 60 mg, Intramuscular, Once, Mohammed Kindle, MD .  orphenadrine (NORFLEX) injection 60 mg, 60 mg, Intramuscular, Once, Mohammed Kindle, MD .  orphenadrine (NORFLEX) injection 60 mg, 60 mg, Intramuscular, Once, Mohammed Kindle, MD .  orphenadrine (NORFLEX) injection 60 mg, 60 mg, Intramuscular, Once, Mohammed Kindle, MD .  sodium chloride flush (NS) 0.9 % injection 20 mL, 20 mL, Other, Once, Mohammed Kindle, MD .  sodium chloride flush (NS) 0.9 % injection 20 mL, 20 mL, Other, Once, Mohammed Kindle, MD .  triamcinolone acetonide (KENALOG-40) injection 40 mg, 40 mg, Other, Once, Mohammed Kindle, MD .  triamcinolone acetonide (KENALOG-40) injection 40 mg, 40 mg, Other, Once, Mohammed Kindle, MD .  triamcinolone acetonide (KENALOG-40) injection 40 mg, 40 mg, Other, Once, Mohammed Kindle, MD .  triamcinolone acetonide (KENALOG-40) injection 40 mg, 40 mg, Other, Once, Mohammed Kindle, MD .  triamcinolone acetonide (KENALOG-40) injection 40 mg, 40 mg, Other, Once, Mohammed Kindle, MD Review of Systems  Constitutional: Negative for chills and fever.  Eyes: Negative for blurred vision and photophobia.  Respiratory: Negative  for cough, hemoptysis and sputum production.   Cardiovascular: Negative for chest pain and palpitations.  Gastrointestinal: Negative for blood in stool and heartburn.    Assessment and Plan: 1. Chronic bilateral low back pain with bilateral sciatica   2. Degenerative disc disease, lumbar   3. Lumbosacral spondylosis without myelopathy   4. Chronic pain syndrome   5. Chronic, continuous use of opioids   6. Facet syndrome, lumbar   Based on the above discussion we will plan on a return to clinic in 2 weeks and I am going to schedule her for an epidural at that time secondary to the severity of her low back pain.  I am also going to request an MRI for further evaluation of her low back pathology.  I have reviewed the [facility] practitioner database information and based on that we will plan on refilling her medications for the hydrocodone 5 mg tablets May 13 and June 14 for 60 tablets.   Follow Up Instructions: Return to clinic in 2 weeks for an epidural    I discussed the assessment and treatment plan with the  patient. The patient was provided an opportunity to ask questions and all were answered. The patient agreed with the plan and demonstrated an understanding of the instructions.   The patient was advised to call back or seek an in-person evaluation if the symptoms worsen or if the condition fails to improve as anticipated.  I provided 30 minutes of non-face-to-face time during this encounter.   Molli Barrows, MD

## 2018-10-17 ENCOUNTER — Telehealth: Payer: Self-pay

## 2018-10-17 NOTE — Telephone Encounter (Addendum)
The patient called and said Dr. Andree Elk said he was going to order an MRI. I do not see an order for one. If he wants her to have one can we get an order put in?  Patient called back inquiring about the MRI

## 2018-10-17 NOTE — Telephone Encounter (Signed)
Can you please put in an order for this?  Thank you

## 2018-10-31 ENCOUNTER — Other Ambulatory Visit: Payer: Self-pay

## 2018-10-31 ENCOUNTER — Telehealth: Payer: Self-pay | Admitting: *Deleted

## 2018-10-31 ENCOUNTER — Other Ambulatory Visit: Payer: Self-pay | Admitting: *Deleted

## 2018-10-31 DIAGNOSIS — G894 Chronic pain syndrome: Secondary | ICD-10-CM

## 2018-11-05 ENCOUNTER — Ambulatory Visit: Payer: BC Managed Care – PPO

## 2018-11-08 ENCOUNTER — Ambulatory Visit
Admission: RE | Admit: 2018-11-08 | Discharge: 2018-11-08 | Disposition: A | Discharge status: Home / Self Care | Payer: BC Managed Care – PPO | Source: Ambulatory Visit | Attending: Anesthesiology | Admitting: Anesthesiology

## 2018-11-08 ENCOUNTER — Other Ambulatory Visit: Payer: Self-pay

## 2018-11-08 DIAGNOSIS — G894 Chronic pain syndrome: Secondary | ICD-10-CM | POA: Diagnosis present

## 2018-11-13 NOTE — Addendum Note (Signed)
Addended by: Molli Barrows on: 11/13/2018 02:49 PM   Modules accepted: Orders

## 2019-01-09 ENCOUNTER — Other Ambulatory Visit: Payer: Self-pay | Admitting: Family Medicine

## 2019-01-09 DIAGNOSIS — Z1231 Encounter for screening mammogram for malignant neoplasm of breast: Secondary | ICD-10-CM

## 2019-02-07 ENCOUNTER — Other Ambulatory Visit: Payer: Self-pay

## 2019-02-07 ENCOUNTER — Ambulatory Visit
Admission: RE | Admit: 2019-02-07 | Discharge: 2019-02-07 | Disposition: A | Discharge status: Home / Self Care | Payer: BC Managed Care – PPO | Source: Ambulatory Visit | Attending: Family Medicine | Admitting: Family Medicine

## 2019-02-07 DIAGNOSIS — Z1231 Encounter for screening mammogram for malignant neoplasm of breast: Secondary | ICD-10-CM | POA: Insufficient documentation

## 2019-04-20 ENCOUNTER — Other Ambulatory Visit: Payer: Self-pay

## 2019-04-20 DIAGNOSIS — Z20822 Contact with and (suspected) exposure to covid-19: Secondary | ICD-10-CM

## 2019-04-22 LAB — NOVEL CORONAVIRUS, NAA: SARS-CoV-2, NAA: NOT DETECTED

## 2020-01-08 ENCOUNTER — Other Ambulatory Visit: Payer: Self-pay | Admitting: Family Medicine

## 2020-01-08 DIAGNOSIS — Z1231 Encounter for screening mammogram for malignant neoplasm of breast: Secondary | ICD-10-CM

## 2020-01-30 DIAGNOSIS — C801 Malignant (primary) neoplasm, unspecified: Secondary | ICD-10-CM

## 2020-01-30 HISTORY — DX: Malignant (primary) neoplasm, unspecified: C80.1

## 2020-02-11 ENCOUNTER — Ambulatory Visit: Payer: Medicaid Other | Admitting: Dermatology

## 2020-02-12 ENCOUNTER — Ambulatory Visit
Admission: RE | Admit: 2020-02-12 | Discharge: 2020-02-12 | Disposition: A | Discharge status: Home / Self Care | Payer: Medicaid Other | Source: Ambulatory Visit | Attending: Family Medicine | Admitting: Family Medicine

## 2020-02-12 ENCOUNTER — Other Ambulatory Visit: Payer: Self-pay

## 2020-02-12 DIAGNOSIS — Z1231 Encounter for screening mammogram for malignant neoplasm of breast: Secondary | ICD-10-CM | POA: Diagnosis present

## 2020-02-18 ENCOUNTER — Other Ambulatory Visit: Payer: Self-pay | Admitting: Family Medicine

## 2020-02-18 DIAGNOSIS — N6489 Other specified disorders of breast: Secondary | ICD-10-CM

## 2020-02-18 DIAGNOSIS — R928 Other abnormal and inconclusive findings on diagnostic imaging of breast: Secondary | ICD-10-CM

## 2020-03-03 ENCOUNTER — Ambulatory Visit
Admission: RE | Admit: 2020-03-03 | Discharge: 2020-03-03 | Disposition: A | Discharge status: Home / Self Care | Payer: Medicaid Other | Source: Ambulatory Visit | Attending: Family Medicine | Admitting: Family Medicine

## 2020-03-03 ENCOUNTER — Other Ambulatory Visit: Payer: Self-pay

## 2020-03-03 DIAGNOSIS — N6489 Other specified disorders of breast: Secondary | ICD-10-CM | POA: Diagnosis present

## 2020-03-03 DIAGNOSIS — R928 Other abnormal and inconclusive findings on diagnostic imaging of breast: Secondary | ICD-10-CM | POA: Diagnosis present

## 2020-12-24 IMAGING — MG MM DIGITAL DIAGNOSTIC UNILAT*R* W/ TOMO W/ CAD
4 series · 4 of 12 positions shown · non-contrast
Comparison: Previous exam(s).

CLINICAL DATA: Patient recalled from screening for right breast
asymmetry.

EXAM:
DIGITAL DIAGNOSTIC UNILATERAL RIGHT MAMMOGRAM WITH TOMO AND CAD

[R MLO synth-2D]
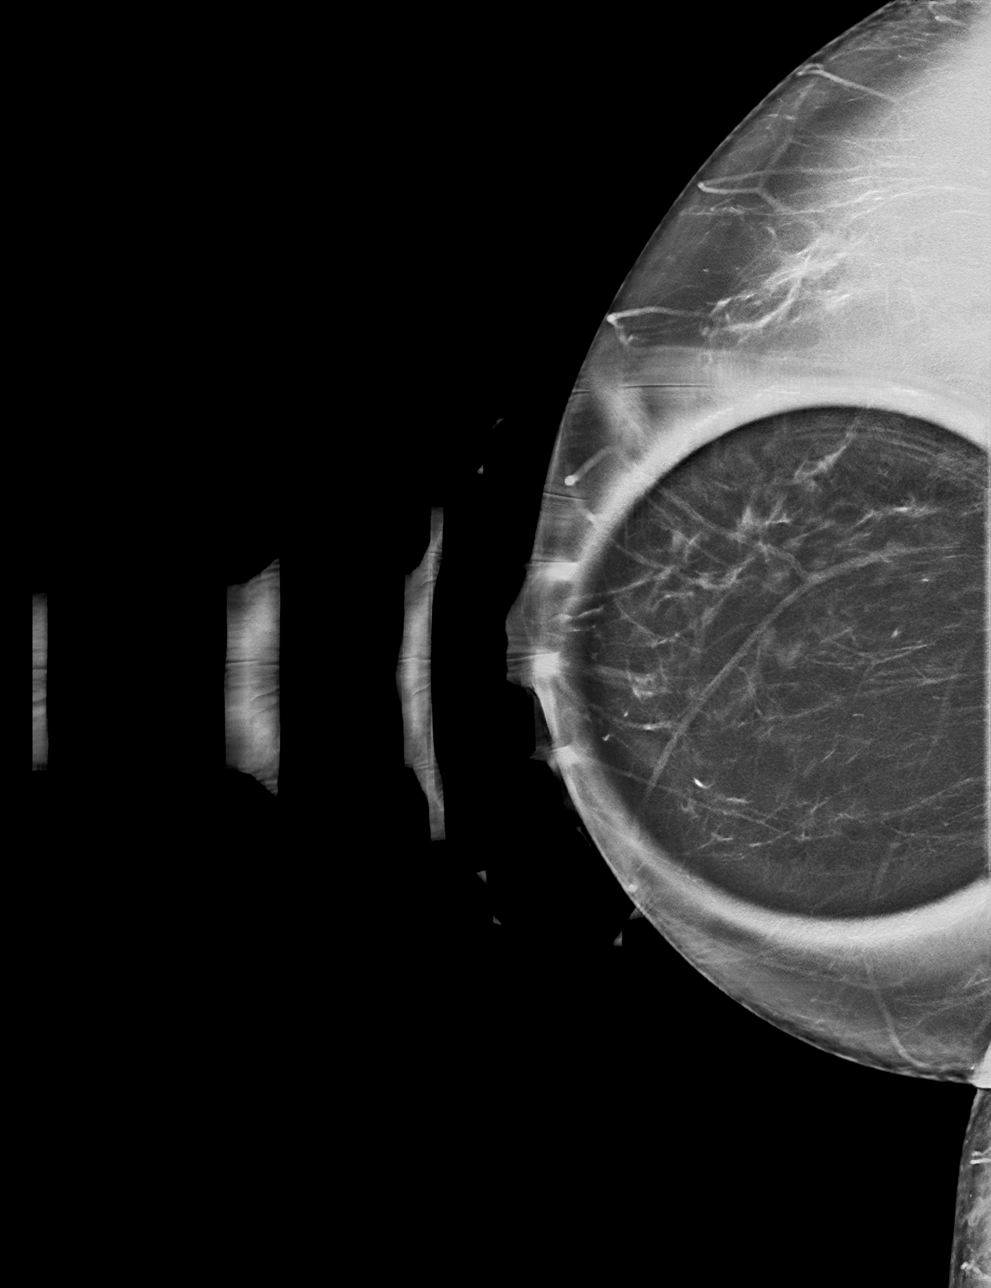

[R CC synth-2D]
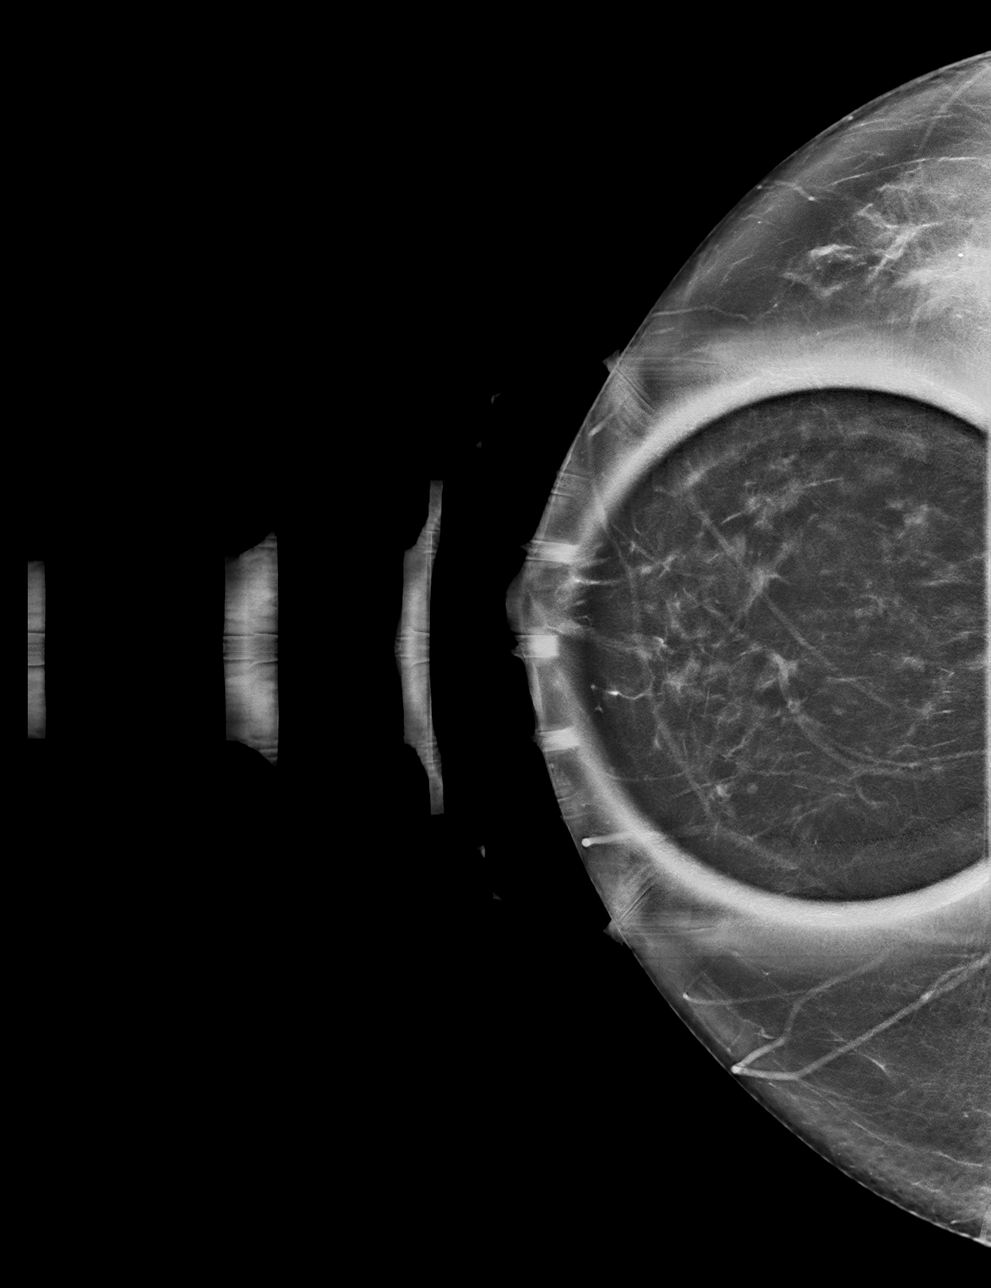

[R CC tomo · tomo slice 35/69.0]
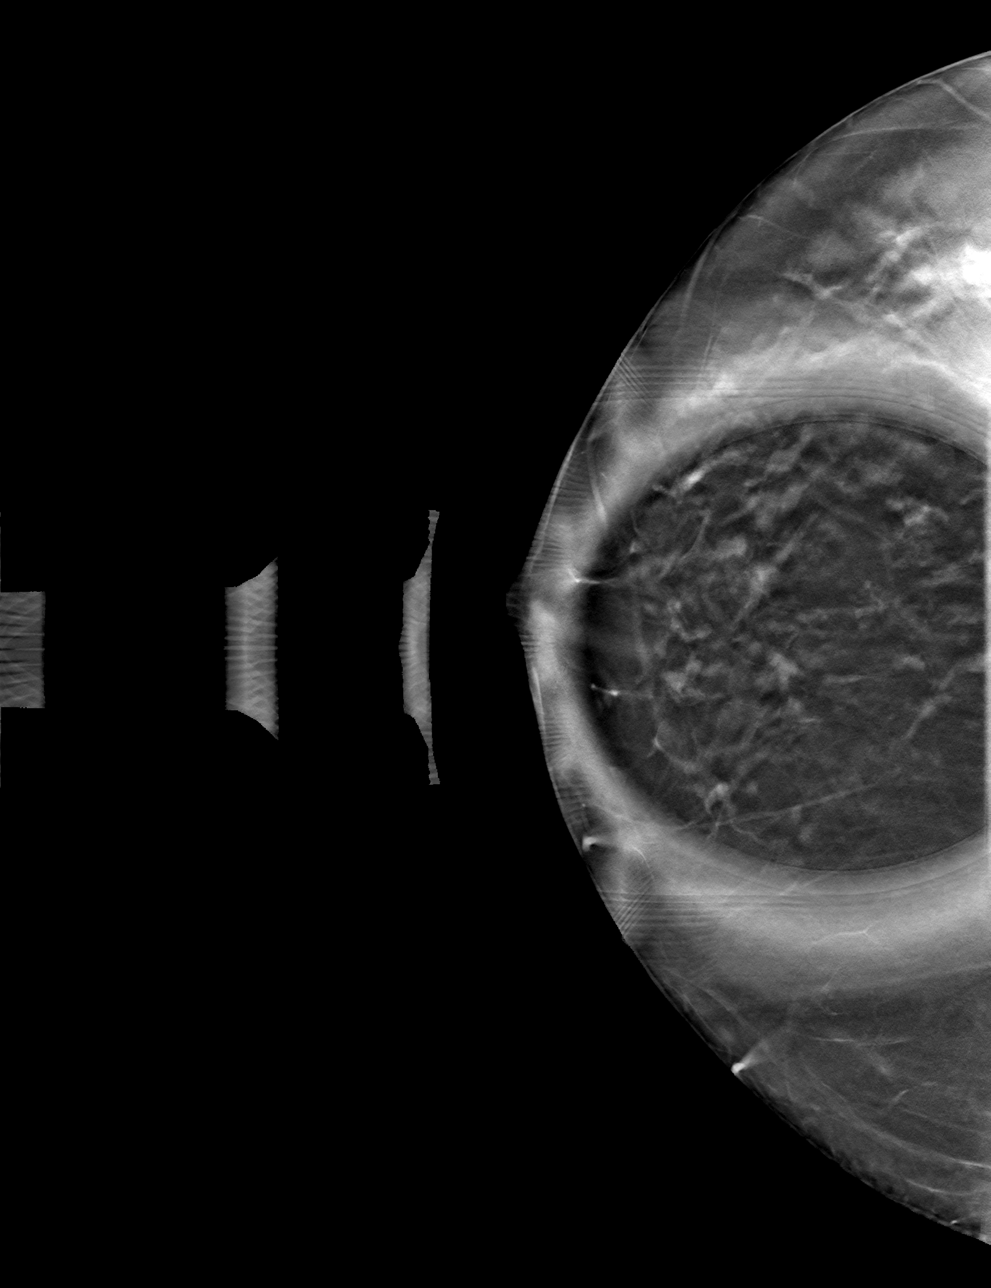

[R MLO tomo · tomo slice 35/68.0]
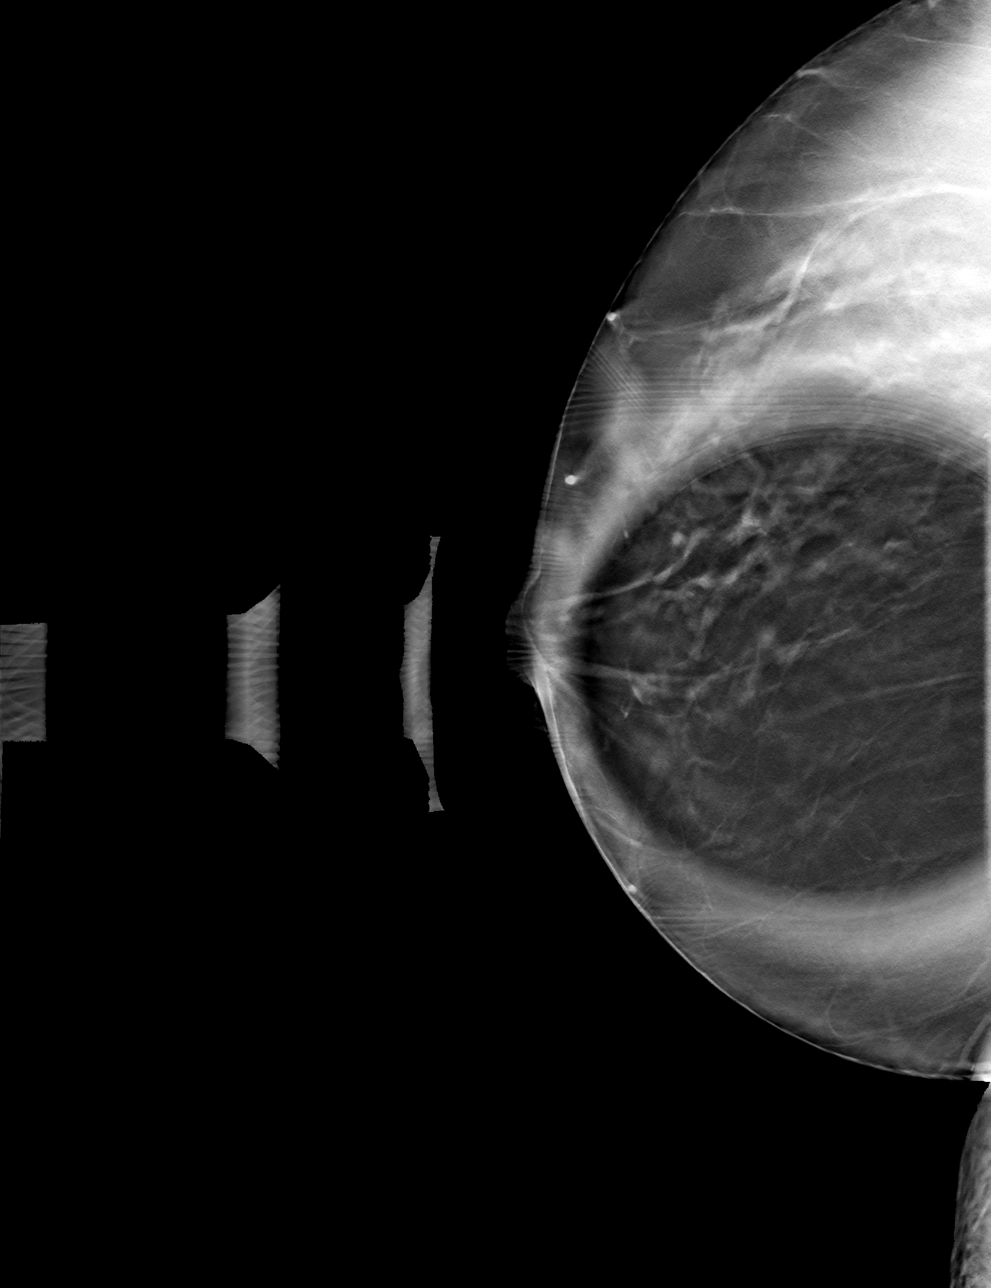

[4 of 12 positions shown; findings below may reference images not displayed]

ACR Breast Density Category b: There are scattered areas of
fibroglandular density.
FINDINGS: Questioned asymmetry within the anterior right breast resolved with
additional imaging compatible with dense overlapping fibroglandular
tissue. No suspicious findings.

Mammographic images were processed with CAD.
IMPRESSION: No mammographic evidence for malignancy.

RECOMMENDATION:
Screening mammogram in one year.(Code:[id number])

I have discussed the findings and recommendations with the patient.
If applicable, a reminder letter will be sent to the patient
regarding the next appointment.

BI-RADS CATEGORY  1: Negative.

## 2021-05-18 ENCOUNTER — Other Ambulatory Visit: Payer: Self-pay

## 2021-05-18 ENCOUNTER — Other Ambulatory Visit
Admission: RE | Admit: 2021-05-18 | Discharge: 2021-05-18 | Disposition: A | Discharge status: Home / Self Care | Payer: Medicaid Other | Source: Ambulatory Visit | Attending: Specialist | Admitting: Specialist

## 2021-05-18 DIAGNOSIS — Z01812 Encounter for preprocedural laboratory examination: Secondary | ICD-10-CM | POA: Insufficient documentation

## 2021-05-18 DIAGNOSIS — Z20822 Contact with and (suspected) exposure to covid-19: Secondary | ICD-10-CM | POA: Insufficient documentation

## 2021-05-19 ENCOUNTER — Ambulatory Visit: Payer: Medicaid Other

## 2021-05-19 LAB — SARS CORONAVIRUS 2 (TAT 6-24 HRS): SARS Coronavirus 2: NEGATIVE

## 2021-05-20 ENCOUNTER — Ambulatory Visit: Payer: Medicaid Other | Attending: Specialist

## 2021-05-20 DIAGNOSIS — R0683 Snoring: Secondary | ICD-10-CM | POA: Insufficient documentation

## 2021-05-20 DIAGNOSIS — F5101 Primary insomnia: Secondary | ICD-10-CM | POA: Diagnosis not present

## 2021-05-20 DIAGNOSIS — G4733 Obstructive sleep apnea (adult) (pediatric): Secondary | ICD-10-CM | POA: Insufficient documentation

## 2021-05-22 ENCOUNTER — Other Ambulatory Visit: Payer: Self-pay

## 2021-10-30 ENCOUNTER — Other Ambulatory Visit: Payer: Self-pay | Admitting: *Deleted

## 2021-10-30 DIAGNOSIS — Z87891 Personal history of nicotine dependence: Secondary | ICD-10-CM

## 2021-10-30 DIAGNOSIS — F17210 Nicotine dependence, cigarettes, uncomplicated: Secondary | ICD-10-CM

## 2021-10-30 DIAGNOSIS — Z122 Encounter for screening for malignant neoplasm of respiratory organs: Secondary | ICD-10-CM

## 2021-11-17 ENCOUNTER — Other Ambulatory Visit: Payer: Self-pay | Admitting: Nurse Practitioner

## 2021-11-17 DIAGNOSIS — R1013 Epigastric pain: Secondary | ICD-10-CM

## 2021-11-19 ENCOUNTER — Ambulatory Visit
Admission: RE | Admit: 2021-11-19 | Discharge: 2021-11-19 | Disposition: A | Discharge status: Home / Self Care | Payer: Medicare Other | Source: Ambulatory Visit | Attending: Nurse Practitioner | Admitting: Nurse Practitioner

## 2021-11-19 DIAGNOSIS — R1013 Epigastric pain: Secondary | ICD-10-CM

## 2021-11-19 MED ORDER — IOHEXOL 300 MG/ML  SOLN
100.0000 mL | Freq: Once | INTRAMUSCULAR | Status: AC | PRN
  Administered 2021-11-19: 100 mL via INTRAVENOUS

## 2021-11-24 ENCOUNTER — Ambulatory Visit (INDEPENDENT_AMBULATORY_CARE_PROVIDER_SITE_OTHER): Payer: Medicare Other | Admitting: Acute Care

## 2021-11-24 ENCOUNTER — Encounter: Payer: Self-pay | Admitting: Acute Care

## 2021-11-24 DIAGNOSIS — F17210 Nicotine dependence, cigarettes, uncomplicated: Secondary | ICD-10-CM | POA: Diagnosis not present

## 2021-11-25 ENCOUNTER — Ambulatory Visit
Admission: RE | Admit: 2021-11-25 | Discharge: 2021-11-25 | Disposition: A | Discharge status: Home / Self Care | Payer: Medicare Other | Source: Ambulatory Visit | Attending: Acute Care | Admitting: Acute Care

## 2021-11-25 DIAGNOSIS — Z87891 Personal history of nicotine dependence: Secondary | ICD-10-CM | POA: Insufficient documentation

## 2021-11-25 DIAGNOSIS — Z122 Encounter for screening for malignant neoplasm of respiratory organs: Secondary | ICD-10-CM | POA: Insufficient documentation

## 2021-11-25 DIAGNOSIS — F17210 Nicotine dependence, cigarettes, uncomplicated: Secondary | ICD-10-CM | POA: Diagnosis present

## 2021-11-27 ENCOUNTER — Other Ambulatory Visit: Payer: Self-pay

## 2021-11-27 DIAGNOSIS — Z122 Encounter for screening for malignant neoplasm of respiratory organs: Secondary | ICD-10-CM

## 2021-11-27 DIAGNOSIS — F17210 Nicotine dependence, cigarettes, uncomplicated: Secondary | ICD-10-CM

## 2021-11-27 DIAGNOSIS — Z87891 Personal history of nicotine dependence: Secondary | ICD-10-CM

## 2021-12-10 ENCOUNTER — Emergency Department: Payer: Medicare Other

## 2021-12-10 ENCOUNTER — Other Ambulatory Visit: Payer: Self-pay

## 2021-12-10 ENCOUNTER — Encounter: Payer: Self-pay | Admitting: Emergency Medicine

## 2021-12-10 ENCOUNTER — Emergency Department
Admission: EM | Admit: 2021-12-10 | Discharge: 2021-12-10 | Disposition: A | Discharge status: Home / Self Care | Payer: Medicare Other | Attending: Emergency Medicine | Admitting: Emergency Medicine

## 2021-12-10 DIAGNOSIS — R110 Nausea: Secondary | ICD-10-CM | POA: Insufficient documentation

## 2021-12-10 DIAGNOSIS — R519 Headache, unspecified: Secondary | ICD-10-CM | POA: Diagnosis present

## 2021-12-10 LAB — BASIC METABOLIC PANEL
Anion gap: 14 (ref 5–15)
BUN: 10 mg/dL (ref 8–23)
CO2: 22 mmol/L (ref 22–32)
Calcium: 9.9 mg/dL (ref 8.9–10.3)
Chloride: 101 mmol/L (ref 98–111)
Creatinine, Ser: 0.75 mg/dL (ref 0.44–1.00)
GFR, Estimated: >60 mL/min (ref >60)
Glucose, Bld: 122 mg/dL — ABNORMAL HIGH (ref 70–99)
Potassium: 3.8 mmol/L (ref 3.5–5.1)
Sodium: 137 mmol/L (ref 135–145)

## 2021-12-10 LAB — CBC
HCT: 45.5 % (ref 36.0–46.0)
Hemoglobin: 14.9 g/dL (ref 12.0–15.0)
MCH: 30.2 pg (ref 26.0–34.0)
MCHC: 32.7 g/dL (ref 30.0–36.0)
MCV: 92.3 fL (ref 80.0–100.0)
Platelets: 375 10*3/uL (ref 150–400)
RBC: 4.93 MIL/uL (ref 3.87–5.11)
RDW: 13.9 % (ref 11.5–15.5)
WBC: 10.1 10*3/uL (ref 4.0–10.5)
nRBC: 0 % (ref 0.0–0.2)

## 2021-12-10 MED ORDER — PROCHLORPERAZINE EDISYLATE 10 MG/2ML IJ SOLN
10.0000 mg | Freq: Once | INTRAMUSCULAR | Status: AC
  Administered 2021-12-10: 10 mg via INTRAVENOUS
  Filled 2021-12-10: qty 2

## 2021-12-10 MED ORDER — SODIUM CHLORIDE 0.9 % IV BOLUS
1000.0000 mL | Freq: Once | INTRAVENOUS | Status: AC
  Administered 2021-12-10: 1000 mL via INTRAVENOUS

## 2021-12-10 MED ORDER — PROCHLORPERAZINE MALEATE 10 MG PO TABS: 10.0000 mg | ORAL_TABLET | Freq: Three times a day (TID) | ORAL | 0 refills | Status: DC | PRN

## 2021-12-10 MED ORDER — ONDANSETRON 4 MG PO TBDP
4.0000 mg | ORAL_TABLET | Freq: Once | ORAL | Status: AC
  Administered 2021-12-10: 4 mg via ORAL
  Filled 2021-12-10: qty 1

## 2021-12-10 NOTE — Discharge Instructions (Signed)
Please seek medical attention for any high fevers, chest pain, shortness of breath, change in behavior, persistent vomiting, bloody stool or any other new or concerning symptoms.  

## 2021-12-10 NOTE — ED Provider Notes (Signed)
[facility] Provider Note    Event Date/Time   First MD Initiated Contact with Patient 12/10/21 1548     (approximate)   History   Headache   HPI  Katherine Lester is a 66 y.o. female who presents to the emergency department today because of concerns for severe headache.  Patient's headache started 3 days ago.  Located on the right side of her head.  Started behind her eyes but notes rating down towards her neck.  Patient denies any trauma to her head.  Denies history of migraines.  Bright lights make the headache worse.  She has had some nausea. Denies any fevers.     Physical Exam   Triage Vital Signs: ED Triage Vitals  Enc Vitals Group     BP 12/10/21 1412 (!) 141/96     Pulse Rate 12/10/21 1412 (!) 104     Resp 12/10/21 1412 20     Temp 12/10/21 1412 (!) 97.5 F (36.4 C)     Temp src --      SpO2 12/10/21 1412 97 %     Weight 12/10/21 1409 210 lb (95.3 kg)     Height 12/10/21 1409 '5\' 6"'$  (1.676 m)     Head Circumference --      Peak Flow --      Pain Score 12/10/21 1408 9     Pain Loc --      Pain Edu? --      Excl. in [place]? --     Most recent vital signs: Vitals:   12/10/21 1412  BP: (!) 141/96  Pulse: (!) 104  Resp: 20  Temp: (!) 97.5 F (36.4 C)  SpO2: 97%    General: Awake, alert, oriented. CV:  Good peripheral perfusion. Regular rte and rhythm Resp:  Normal effort. Lungs clear. Abd:  No distention.  Other:  No cervical midline tenderness.    ED Results / Procedures / Treatments   Labs (all labs ordered are listed, but only abnormal results are displayed) Labs Reviewed  BASIC METABOLIC PANEL - Abnormal; Notable for the following components:      Result Value   Glucose, Bld 122 (*)    All other components within normal limits  CBC     EKG  None   RADIOLOGY I independently interpreted and visualized the CT head. My interpretation: No bleed. Radiology interpretation:  IMPRESSION:  Normal head CT without  contrast for age   I independently interpreted and visualized the CT cervical spine. 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    Multilevel degenerative changes    No acute osseous finding, definite fracture, or malalignment.     PROCEDURES:  Critical Care performed: No  Procedures   MEDICATIONS ORDERED IN ED: Medications  ondansetron (ZOFRAN-ODT) disintegrating tablet 4 mg (has no administration in time range)     IMPRESSION / MDM / ASSESSMENT AND PLAN / ED COURSE  I reviewed the triage vital signs and the nursing notes.                              Differential diagnosis includes, but is not limited to, intracranial bleed, migraine, tension headache.   Patient's presentation is most consistent with acute presentation with potential threat to life or bodily function.   Patient  presented to the emergency department today because of concerns for severe right sided headache.  On exam patient without any neurodeficits.  Head CT did not show any acute abnormality.  Patient was given IV fluids and medication.  She did feel improvement.  Will give patient oral medication to help with headaches.  Given clinical improvement and negative imaging I do not feel patient necessitates inpatient mission at this time.   FINAL CLINICAL IMPRESSION(S) / ED DIAGNOSES   Final diagnoses:  Nonintractable headache, unspecified chronicity pattern, unspecified headache type        Note:  This document was prepared using Dragon voice recognition software and may include unintentional dictation errors.    Nance Pear, MD 12/10/21 [phone]

## 2021-12-10 NOTE — ED Triage Notes (Signed)
Pt via POV from home. Pt c/o L sided head pain, L neck pain, and nausea since Monday. Denies hx of migraine, pt states she rarely gets headaches. Denies head injury. Pt is A&OX4 and NAD

## 2022-02-25 ENCOUNTER — Ambulatory Visit: Admit: 2022-02-25 | Payer: Medicare Other | Admitting: Gastroenterology

## 2022-02-25 SURGERY — COLONOSCOPY
Anesthesia: General

## 2022-07-01 ENCOUNTER — Other Ambulatory Visit: Payer: Self-pay | Admitting: Neurology

## 2022-07-01 DIAGNOSIS — R251 Tremor, unspecified: Secondary | ICD-10-CM

## 2022-07-16 ENCOUNTER — Ambulatory Visit
Admission: RE | Admit: 2022-07-16 | Discharge: 2022-07-16 | Disposition: A | Discharge status: Home / Self Care | Payer: 59 | Source: Ambulatory Visit | Attending: Neurology | Admitting: Neurology

## 2022-07-16 DIAGNOSIS — R251 Tremor, unspecified: Secondary | ICD-10-CM

## 2022-09-06 ENCOUNTER — Other Ambulatory Visit: Payer: Self-pay | Admitting: Student

## 2022-09-06 DIAGNOSIS — Z8249 Family history of ischemic heart disease and other diseases of the circulatory system: Secondary | ICD-10-CM

## 2022-09-06 DIAGNOSIS — M5481 Occipital neuralgia: Secondary | ICD-10-CM

## 2022-09-11 IMAGING — CT CT ABD-PELV W/ CM
2 of 5 series · 16 of 46 positions shown, 18 images · IV contrast (agent unspecified)
Comparison: None Available.

CLINICAL DATA: Epigastric pain

EXAM:
CT ABDOMEN AND PELVIS WITH CONTRAST
TECHNIQUE: Multidetector CT imaging of the abdomen and pelvis was performed
using the standard protocol following bolus administration of
intravenous contrast.

[Series 2: abd pelvis 5.00 · axial · 0.93mm/px · z∈[-1591,-1136]mm · 13 of 103 slices shown, 15 images]
[im 6/103  soft-tissue]
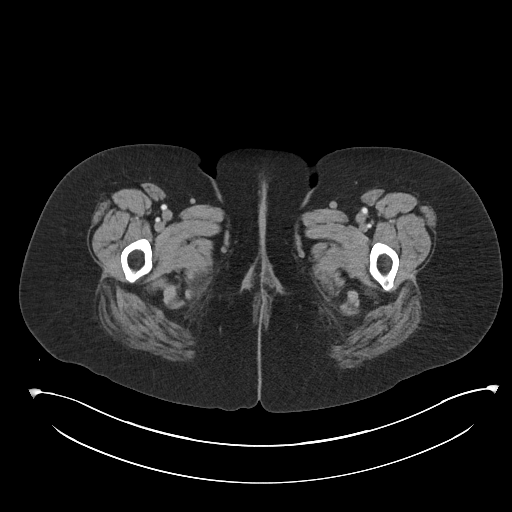
[im 6/103  bone]
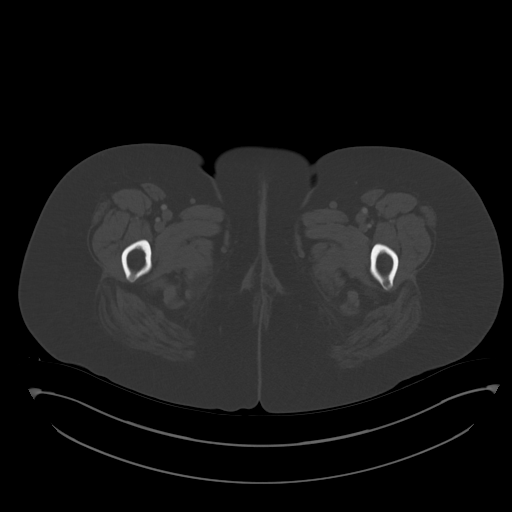
[im 17/103  soft-tissue]
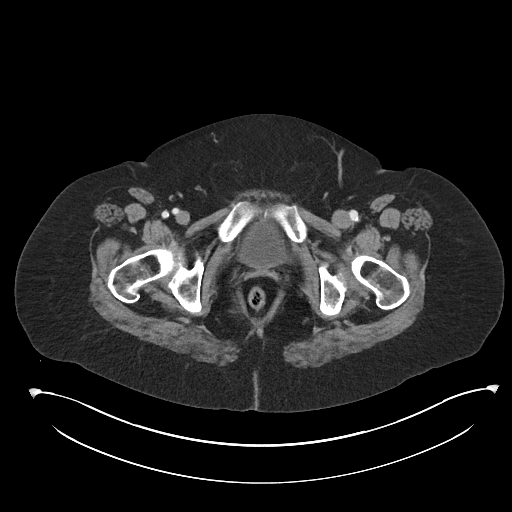
[im 22/103  soft-tissue]
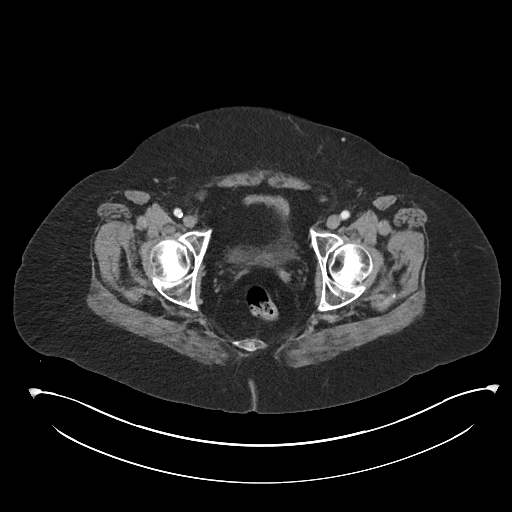
[im 27/103  soft-tissue]
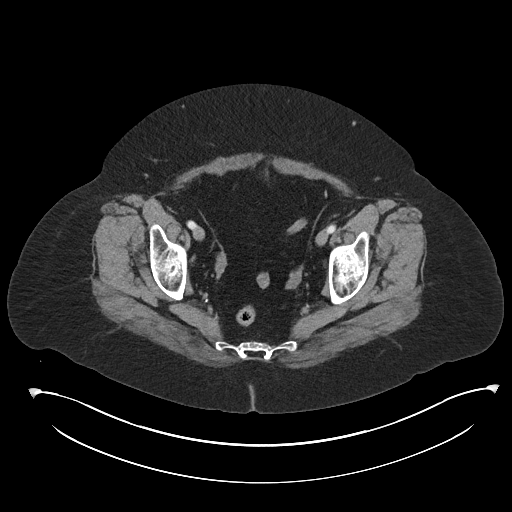
[im 38/103  soft-tissue]
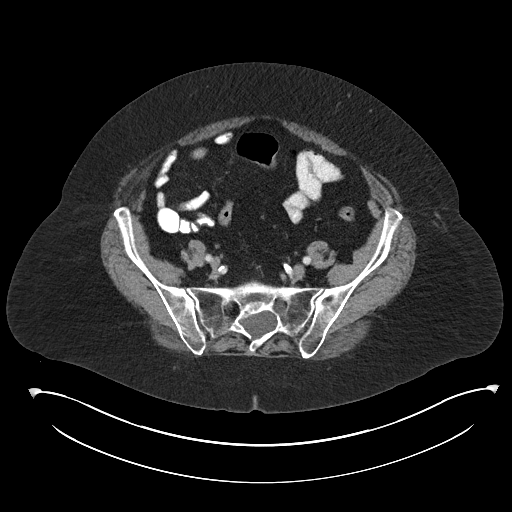
[im 43/103  soft-tissue]
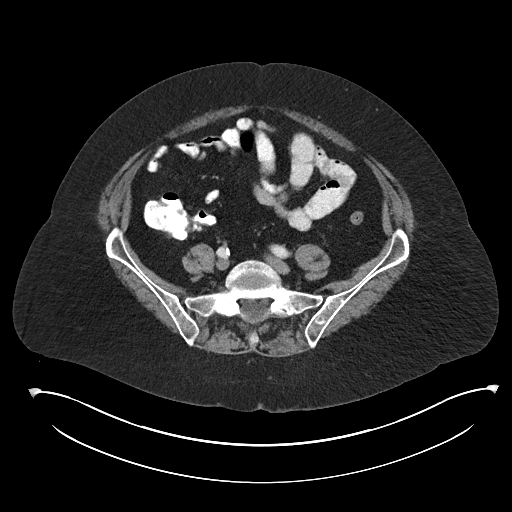
[im 54/103  soft-tissue]
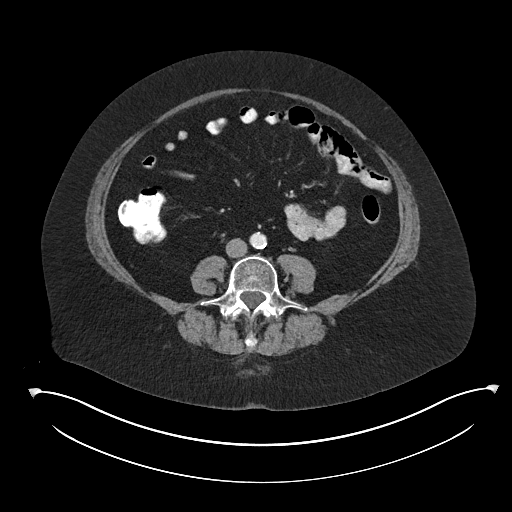
[im 60/103  soft-tissue]
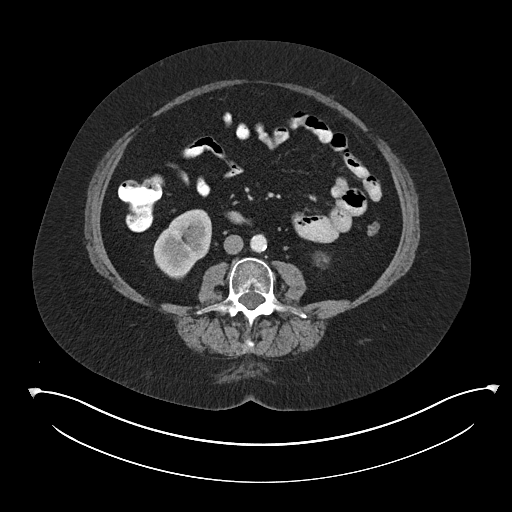
[im 65/103  soft-tissue]
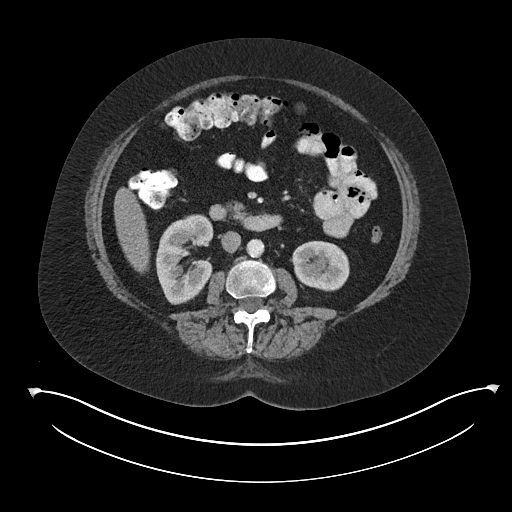
[im 65/103  bone]
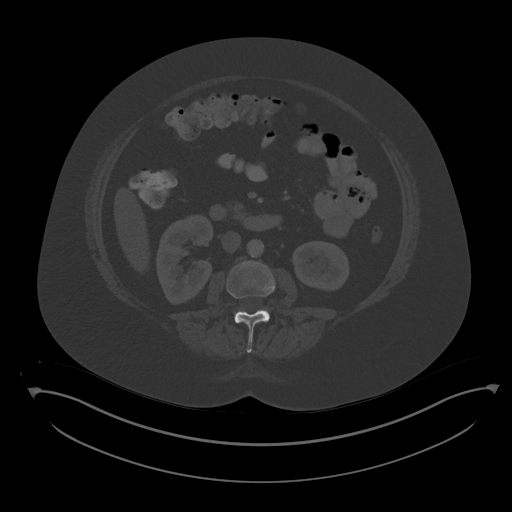
[im 76/103  soft-tissue]
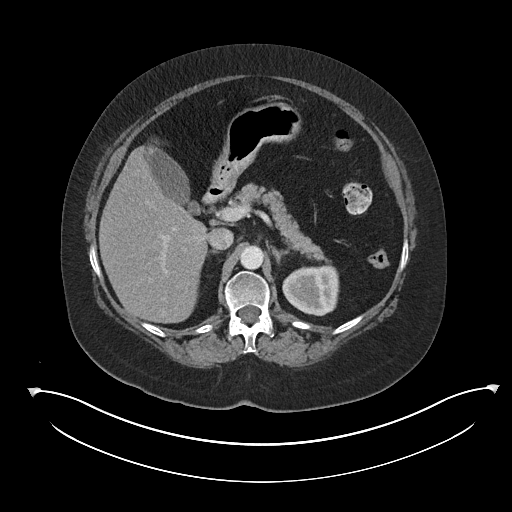
[im 81/103  soft-tissue]
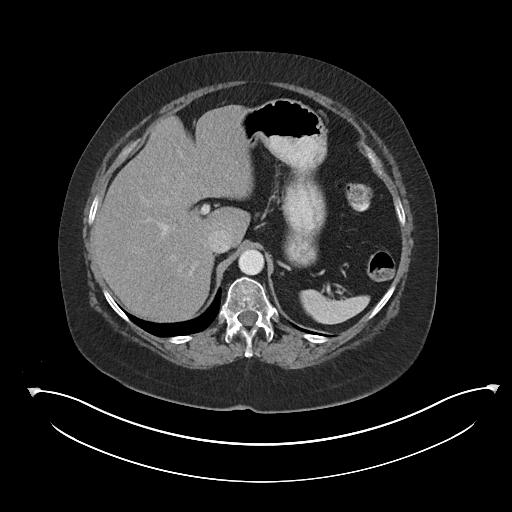
[im 86/103  soft-tissue]
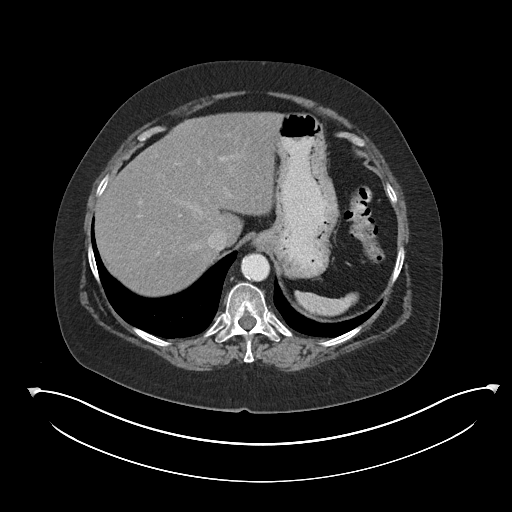
[im 97/103  soft-tissue]
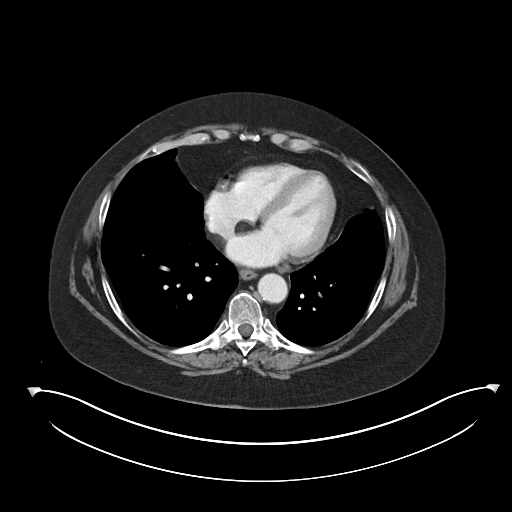

[Series 4: coronals abd pelvis 2.00 cor · coronal · 0.91mm/px · 3 of 176 slices shown]
[im 59/176  soft-tissue]
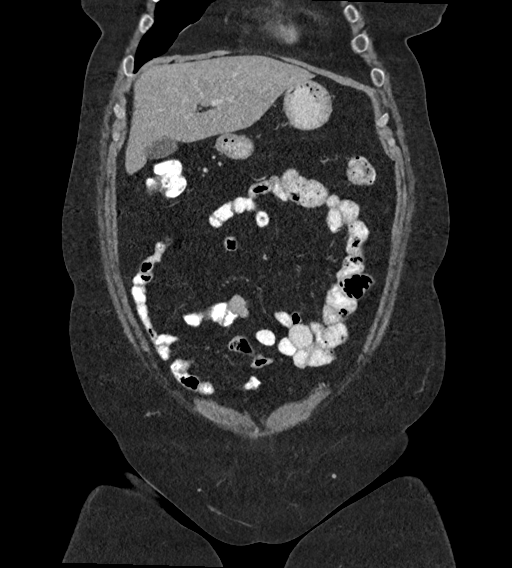
[im 78/176  soft-tissue]
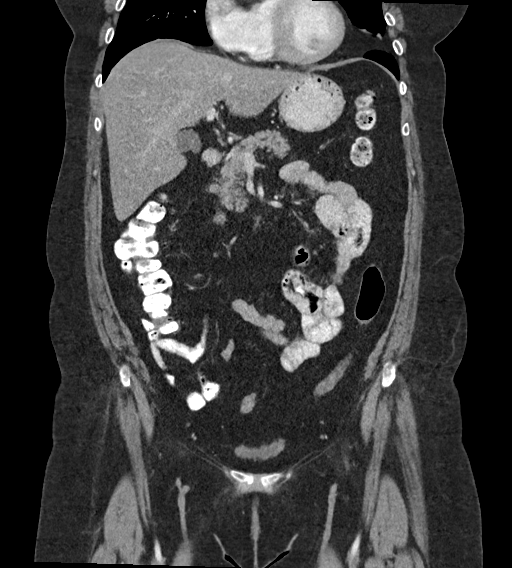
[im 98/176  soft-tissue]
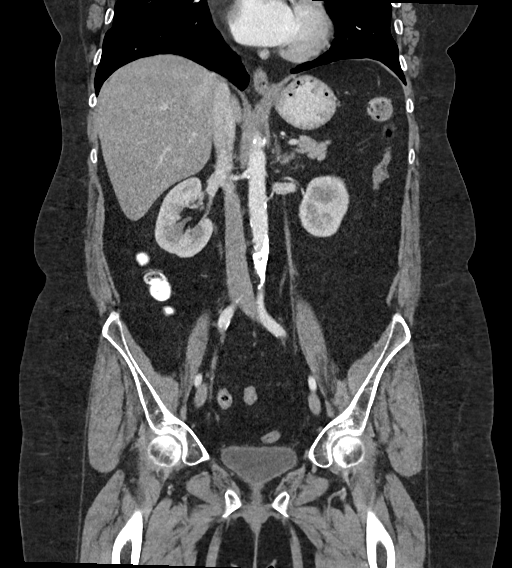

[16 of 46 positions shown; findings below may reference images not displayed]

RADIATION DOSE REDUCTION: This exam was performed according to the
departmental dose-optimization program which includes automated
exposure control, adjustment of the mA and/or kV according to
patient size and/or use of iterative reconstruction technique.

CONTRAST:  100mL OMNIPAQUE IOHEXOL 300 MG/ML  SOLN
FINDINGS: Lower chest: Small coronary artery calcification is seen.

Hepatobiliary:

Pancreas: No focal abnormality is seen.

Spleen: Unremarkable.

Adrenals/Urinary Tract: Adrenals are not enlarged. There is no
hydronephrosis. There is malrotation in the right kidney. There are
no renal or ureteral stones. Urinary bladder is unremarkable.

Stomach/Bowel: Stomach is unremarkable. Small bowel loops are not
dilated. Appendix is not seen. There is no significant wall
thickening in the colon. There is no pericolic stranding.

Vascular/Lymphatic: Scattered arterial calcifications are seen.

Reproductive: Uterus is not seen.  There are no adnexal masses.

Other: There is no ascites or pneumoperitoneum. Small umbilical
hernia containing fat is seen.

Musculoskeletal: Unremarkable.
IMPRESSION: There is no evidence of intestinal obstruction or pneumoperitoneum.
There is no hydronephrosis.

Fatty liver.  Other findings as described in the body of the report.

## 2022-09-16 ENCOUNTER — Ambulatory Visit
Admission: RE | Admit: 2022-09-16 | Discharge: 2022-09-16 | Disposition: A | Discharge status: Home / Self Care | Payer: 59 | Source: Ambulatory Visit | Attending: Student | Admitting: Student

## 2022-09-16 ENCOUNTER — Other Ambulatory Visit: Payer: 59

## 2022-09-16 DIAGNOSIS — Z8249 Family history of ischemic heart disease and other diseases of the circulatory system: Secondary | ICD-10-CM | POA: Diagnosis present

## 2022-09-16 DIAGNOSIS — Z8661 Personal history of infections of the central nervous system: Secondary | ICD-10-CM | POA: Diagnosis not present

## 2022-09-16 DIAGNOSIS — R519 Headache, unspecified: Secondary | ICD-10-CM | POA: Diagnosis not present

## 2022-09-16 DIAGNOSIS — M5481 Occipital neuralgia: Secondary | ICD-10-CM | POA: Diagnosis not present

## 2022-09-27 ENCOUNTER — Encounter: Payer: Self-pay | Admitting: Emergency Medicine

## 2022-09-27 ENCOUNTER — Emergency Department
Admission: EM | Admit: 2022-09-27 | Discharge: 2022-09-27 | Disposition: A | Discharge status: Home / Self Care | Payer: 59 | Attending: Emergency Medicine | Admitting: Emergency Medicine

## 2022-09-27 ENCOUNTER — Other Ambulatory Visit: Payer: Self-pay

## 2022-09-27 ENCOUNTER — Emergency Department: Payer: 59

## 2022-09-27 DIAGNOSIS — W19XXXA Unspecified fall, initial encounter: Secondary | ICD-10-CM

## 2022-09-27 DIAGNOSIS — S8001XA Contusion of right knee, initial encounter: Secondary | ICD-10-CM

## 2022-09-27 DIAGNOSIS — S81011A Laceration without foreign body, right knee, initial encounter: Secondary | ICD-10-CM | POA: Insufficient documentation

## 2022-09-27 DIAGNOSIS — W010XXA Fall on same level from slipping, tripping and stumbling without subsequent striking against object, initial encounter: Secondary | ICD-10-CM | POA: Insufficient documentation

## 2022-09-27 DIAGNOSIS — S51811A Laceration without foreign body of right forearm, initial encounter: Secondary | ICD-10-CM | POA: Insufficient documentation

## 2022-09-27 DIAGNOSIS — S8991XA Unspecified injury of right lower leg, initial encounter: Secondary | ICD-10-CM | POA: Diagnosis present

## 2022-09-27 DIAGNOSIS — S81012A Laceration without foreign body, left knee, initial encounter: Secondary | ICD-10-CM | POA: Diagnosis not present

## 2022-09-27 MED ORDER — LIDOCAINE HCL (PF) 1 % IJ SOLN
10.0000 mL | Freq: Once | INTRAMUSCULAR | Status: AC
  Administered 2022-09-27: 10 mL via INTRADERMAL
  Filled 2022-09-27: qty 10

## 2022-09-27 NOTE — ED Triage Notes (Addendum)
Pt reports tripping on her crocs and falling, pt c/o bilateral knee pain and right arm pain, skin tears noted, denies thinners, LOC and hitting head

## 2022-09-27 NOTE — Discharge Instructions (Signed)
Follow-up with your regular doctor for suture removal in 2 weeks, please call for an appointment Follow-up with your regular doctor return emergency department for any sign of infection Keep the area as dry as possible

## 2022-09-27 NOTE — ED Provider Notes (Signed)
[facility] Provider Note    Event Date/Time   First MD Initiated Contact with Patient 09/27/22 1216     (approximate)   History   Fall   HPI   Katherine Lester is a 67 y.o. female with history of GERD, IBS, anxiety, presents emergency department with laceration to both knees, the right arm.  Earlier today the patient was going to put on shoes and tripped over her crocs and fell to both knees and her right arm.  Patient states that she has multiple skin tears.  Her Tdap is up-to-date.  Complaining of some knee pain.  No head injury.  No neck pain.      Physical Exam   Triage Vital Signs: ED Triage Vitals [09/27/22 1145]  Enc Vitals Group     BP (!) 150/107     Pulse Rate 98     Resp 17     Temp 97.8 F (36.6 C)     Temp Source Oral     SpO2 98 %     Weight 200 lb (90.7 kg)     Height 5\' 6"  (1.676 m)     Head Circumference      Peak Flow      Pain Score 8     Pain Loc      Pain Edu?      Excl. in [place]?     Most recent vital signs: Vitals:   09/27/22 1145  BP: (!) 150/107  Pulse: 98  Resp: 17  Temp: 97.8 F (36.6 C)  SpO2: 98%     General: Awake, no distress.   CV:  Good peripheral perfusion. regular rate and  rhythm Resp:  Normal effort.  Abd:  No distention.   Other:  Right knee with a large laceration noted approximately 4 inches, multiple skin tears noted on the right forearm, large skin tear noted on the left knee, no foreign body noted, patient does have full range of motion of the right knee but the area is tender to palpation.  Neurovascular appears to be intact   ED Results / Procedures / Treatments   Labs (all labs ordered are listed, but only abnormal results are displayed) Labs Reviewed - No data to display   EKG     RADIOLOGY X-ray of the right knee    PROCEDURES:   .Marland KitchenLaceration Repair  Date/Time: 09/27/2022 2:36 PM  Performed by: Faythe Ghee, PA-C Authorized by: Faythe Ghee, PA-C    Consent:    Consent obtained:  Verbal   Consent given by:  Patient   Risks, benefits, and alternatives were discussed: yes     Risks discussed:  Infection, pain, retained foreign body, tendon damage, poor cosmetic result, need for additional repair, nerve damage, poor wound healing and vascular damage   Alternatives discussed:  No treatment Universal protocol:    Procedure explained and questions answered to patient or proxy's satisfaction: yes     Immediately prior to procedure, a time out was called: yes     Patient identity confirmed:  Verbally with patient Anesthesia:    Anesthesia method:  Local infiltration   Local anesthetic:  Lidocaine 1% w/o epi Laceration details:    Location:  Leg   Leg location:  R knee   Length (cm):  8 Pre-procedure details:    Preparation:  Patient was prepped and draped in usual sterile fashion and imaging obtained to evaluate for foreign bodies Exploration:    Hemostasis achieved  with:  Direct pressure   Imaging obtained: x-ray     Imaging outcome: foreign body not noted     Wound exploration: wound explored through full range of motion and entire depth of wound visualized     Wound extent: areolar tissue not violated, fascia not violated, no foreign body, no signs of injury, no nerve damage, no tendon damage, no underlying fracture and no vascular damage     Contaminated: no   Treatment:    Area cleansed with:  Povidone-iodine and saline   Amount of cleaning:  Standard   Irrigation solution:  Sterile saline   Irrigation method:  Syringe and tap Skin repair:    Repair method:  Sutures   Suture size:  3-0   Suture material:  Nylon   Suture technique:  Simple interrupted   Number of sutures:  14 Approximation:    Approximation:  Close Repair type:    Repair type:  Simple Post-procedure details:    Dressing:  Non-adherent dressing   Procedure completion:  Tolerated well, no immediate complications    MEDICATIONS ORDERED IN  ED: Medications  lidocaine (PF) (XYLOCAINE) 1 % injection 10 mL (10 mLs Intradermal Given by Other 09/27/22 1227)     IMPRESSION / MDM / ASSESSMENT AND PLAN / ED COURSE  I reviewed the triage vital signs and the nursing notes.                              Differential diagnosis includes, but is not limited to, fracture, contusion, laceration  Patient's presentation is most consistent with acute complicated illness / injury requiring diagnostic workup.   X-ray of the right knee  See procedure note for laceration repair  Steri-Strips applied to the skin tears on the forearm   X-ray of the right knee was independently reviewed and interpreted by me as being negative for any acute abnormality  Explain all this to the patient.  She is to follow-up with her regular doctor in 14 days for suture removal.  Return emergency department or see your doctor if any sign of infection.  Keep the areas dry as possible.  She is in agreement treatment plan.  Discharged in stable condition.   FINAL CLINICAL IMPRESSION(S) / ED DIAGNOSES   Final diagnoses:  Fall, initial encounter  Knee laceration, right, initial encounter  Contusion of right knee, initial encounter     Rx / DC Orders   ED Discharge Orders     None        Note:  This document was prepared using Dragon voice recognition software and may include unintentional dictation errors.    Faythe Ghee, PA-C 09/27/22 1438    Jene Every, MD 09/29/22 [phone]

## 2022-10-03 ENCOUNTER — Other Ambulatory Visit: Payer: Self-pay | Admitting: Acute Care

## 2022-10-03 DIAGNOSIS — F17210 Nicotine dependence, cigarettes, uncomplicated: Secondary | ICD-10-CM

## 2022-10-03 DIAGNOSIS — Z87891 Personal history of nicotine dependence: Secondary | ICD-10-CM

## 2022-10-03 DIAGNOSIS — Z122 Encounter for screening for malignant neoplasm of respiratory organs: Secondary | ICD-10-CM

## 2022-11-30 ENCOUNTER — Ambulatory Visit: Admission: RE | Admit: 2022-11-30 | Payer: 59 | Source: Ambulatory Visit

## 2022-12-07 ENCOUNTER — Ambulatory Visit
Admission: RE | Admit: 2022-12-07 | Discharge: 2022-12-07 | Disposition: A | Discharge status: Home / Self Care | Payer: 59 | Source: Ambulatory Visit | Attending: Acute Care | Admitting: Acute Care

## 2022-12-07 DIAGNOSIS — Z87891 Personal history of nicotine dependence: Secondary | ICD-10-CM

## 2022-12-07 DIAGNOSIS — Z122 Encounter for screening for malignant neoplasm of respiratory organs: Secondary | ICD-10-CM

## 2022-12-07 DIAGNOSIS — F17210 Nicotine dependence, cigarettes, uncomplicated: Secondary | ICD-10-CM | POA: Diagnosis not present

## 2022-12-08 ENCOUNTER — Other Ambulatory Visit: Payer: Self-pay | Admitting: Acute Care

## 2022-12-08 DIAGNOSIS — Z87891 Personal history of nicotine dependence: Secondary | ICD-10-CM

## 2022-12-08 DIAGNOSIS — Z122 Encounter for screening for malignant neoplasm of respiratory organs: Secondary | ICD-10-CM

## 2023-02-17 ENCOUNTER — Emergency Department: Payer: 59

## 2023-02-17 ENCOUNTER — Emergency Department
Admission: EM | Admit: 2023-02-17 | Discharge: 2023-02-17 | Disposition: A | Discharge status: Home / Self Care | Payer: 59 | Attending: Emergency Medicine | Admitting: Emergency Medicine

## 2023-02-17 ENCOUNTER — Other Ambulatory Visit: Payer: Self-pay

## 2023-02-17 DIAGNOSIS — R202 Paresthesia of skin: Secondary | ICD-10-CM | POA: Diagnosis not present

## 2023-02-17 DIAGNOSIS — M5416 Radiculopathy, lumbar region: Secondary | ICD-10-CM | POA: Diagnosis not present

## 2023-02-17 DIAGNOSIS — M25561 Pain in right knee: Secondary | ICD-10-CM | POA: Diagnosis present

## 2023-02-17 MED ORDER — LIDOCAINE 5 % EX PTCH
1.0000 | MEDICATED_PATCH | CUTANEOUS | Status: DC
  Administered 2023-02-17: 1 via TRANSDERMAL
  Filled 2023-02-17: qty 1

## 2023-02-17 MED ORDER — NAPROXEN 500 MG PO TABS
500.0000 mg | ORAL_TABLET | Freq: Two times a day (BID) | ORAL | 0 refills | Status: AC
Start: 2023-02-17 — End: 2023-02-24

## 2023-02-17 MED ORDER — KETOROLAC TROMETHAMINE 15 MG/ML IJ SOLN
15.0000 mg | Freq: Once | INTRAMUSCULAR | Status: AC
  Administered 2023-02-17: 15 mg via INTRAMUSCULAR
  Filled 2023-02-17: qty 1

## 2023-02-17 MED ORDER — ACETAMINOPHEN 325 MG PO TABS
650.0000 mg | ORAL_TABLET | Freq: Once | ORAL | Status: AC
  Administered 2023-02-17: 650 mg via ORAL
  Filled 2023-02-17: qty 2

## 2023-02-17 MED ORDER — OXYCODONE HCL 5 MG PO TABS
5.0000 mg | ORAL_TABLET | Freq: Once | ORAL | Status: AC
  Administered 2023-02-17: 5 mg via ORAL
  Filled 2023-02-17: qty 1

## 2023-02-17 NOTE — ED Provider Notes (Signed)
[facility] Provider Note    Event Date/Time   First MD Initiated Contact with Patient 02/17/23 1044     (approximate)   History   Leg Pain   HPI  Katherine Lester is a 67 y.o. female with a past medical history of degenerative disc disease, chronic back pain, sacroiliac joint dysfunction, facet syndrome of the lumbar spine, greater trochanteric bursitis who presents today for evaluation of bilateral knee pain.  Patient reports that she got her usual SI joint injections with her back doctor at [facility] 3 days ago which she normally tolerates very well.  She reports that she awoke this morning at 4 AM with bilateral knee pain and paresthesias in her upper thighs.  She reports that she typically has paresthesias in her upper thighs from her known degenerative disc disease.  However the pain in her knees is new.  She has not noticed any redness or warmth to this area.  She is still able to ambulate.  She has not had any falls or trauma.  No urinary or fecal incontinence or retention.  No saddle anesthesia.  Patient Active Problem List   Diagnosis Date Noted   Greater trochanteric bursitis 09/02/2015   Degenerative disc disease, lumbar 11/13/2014   Facet syndrome, lumbar 11/13/2014   Sacroiliac joint dysfunction 11/13/2014          Physical Exam   Triage Vital Signs: ED Triage Vitals [02/17/23 1031]  Encounter Vitals Group     BP (!) 153/91     Systolic BP Percentile      Diastolic BP Percentile      Pulse Rate 72     Resp 18     Temp 98 F (36.7 C)     Temp src      SpO2 100 %     Weight      Height      Head Circumference      Peak Flow      Pain Score 10     Pain Loc      Pain Education      Exclude from Growth Chart     Most recent vital signs: Vitals:   02/17/23 1031 02/17/23 1424  BP: (!) 153/91 (!) 150/88  Pulse: 72 70  Resp: 18 18  Temp: 98 F (36.7 C)   SpO2: 100% 100%    Physical Exam Vitals and nursing note reviewed.   Constitutional:      General: Awake and alert. No acute distress.    Appearance: Normal appearance. The patient is obese.  HENT:     Head: Normocephalic and atraumatic.     Mouth: Mucous membranes are moist.  Eyes:     General: PERRL. Normal EOMs        Right eye: No discharge.        Left eye: No discharge.     Conjunctiva/sclera: Conjunctivae normal.  Cardiovascular:     Rate and Rhythm: Normal rate and regular rhythm.     Pulses: Normal pulses.  Pulmonary:     Effort: Pulmonary effort is normal. No respiratory distress.     Breath sounds: Normal breath sounds.  Abdominal:     Abdomen is soft. There is no abdominal tenderness. No rebound or guarding. No distention. Musculoskeletal:        General: No swelling. Normal range of motion.     Cervical back: Normal range of motion and neck supple.  Back: No midline tenderness. Strength and sensation  5/5 to bilateral lower extremities. Normal great toe extension against resistance. Normal sensation throughout feet. Normal patellar reflexes. Negative SLR and opposite SLR bilaterally. Negative FABER test Bilateral knees: No deformity or rash. No joint line tenderness. No patellar tenderness, no ballotment Warm and well perfused extremity with 2+ pedal pulses 5/5 strength to dorsiflexion and plantarflexion at the ankle with intact sensation throughout extremity Normal range of motion of the knee, with intact flexion and extension to active and passive range of motion. Extensor mechanism intact. No ligamentous laxity. Negative anterior/posterior drawer/negative lachman, negative mcmurrays No effusion or warmth Intact quadriceps, hamstring function, patellar tendon function Pelvis stable Full ROM of ankle without pain or swelling Foot warm and well perfused Skin:    General: Skin is warm and dry.     Capillary Refill: Capillary refill takes less than 2 seconds.     Findings: No rash.  Neurological:     Mental Status: The patient is  awake and alert.      ED Results / Procedures / Treatments   Labs (all labs ordered are listed, but only abnormal results are displayed) Labs Reviewed - No data to display   EKG     RADIOLOGY I independently reviewed and interpreted imaging and agree with radiologists findings.     PROCEDURES:  Critical Care performed:   Procedures   MEDICATIONS ORDERED IN ED: Medications  lidocaine (LIDODERM) 5 % 1 patch (1 patch Transdermal Patch Applied 02/17/23 1115)  ketorolac (TORADOL) 15 MG/ML injection 15 mg (15 mg Intramuscular Given 02/17/23 1114)  oxyCODONE (Oxy IR/ROXICODONE) immediate release tablet 5 mg (5 mg Oral Given 02/17/23 1113)  acetaminophen (TYLENOL) tablet 650 mg (650 mg Oral Given 02/17/23 1113)     IMPRESSION / MDM / ASSESSMENT AND PLAN / ED COURSE  I reviewed the triage vital signs and the nursing notes.   Differential diagnosis includes, but is not limited to, radiculopathy, sacroiliitis, trochanteric bursitis, less likely infection or epidural hematoma.  I reviewed the patient's chart.  Patient has had multiple fluoroscopic injections into her sacroiliac joint, trochanteric bursa, and trigger point injections.  Most recent was 02/14/2023.  This was done with Dr. Yves Dill.  She complains of chronic back pain with radiation along the bilateral waistline into the lateral aspect of the thighs bilaterally.  She had an MRI in 2020 that demonstrated moderate facet degenerative changes bilaterally at L4-L5 and L5-S1.  Patient is awake and alert, hemodynamically stable and afebrile.  She is nontoxic in appearance.  She has 5 out of 5 strength with intact sensation to extensor hallucis dorsiflexion and plantarflexion of bilateral lower extremities with normal patellar reflexes bilaterally. Most likely etiology is exacerbation of her known radiculopathy. No major trauma, no midline tenderness, no history or physical exam findings to suggest cauda equina syndrome or spinal  cord compression. No focal neurological deficits on exam. No constitutional symptoms or history of immunosuppression or IVDA to suggest potential for epidural abscess. Not anticoagulated, no history of bleeding diastasis to suggest risk for epidural hematoma. No chronic steroid use or advanced age or history of malignancy to suggest proclivity towards pathological fracture.  No abdominal pain or flank pain to suggest kidney stone, no history of kidney stone.  No fever or dysuria or CVAT to suggest pyelonephritis.  No chest pain, back pain, shortness of breath, neurological deficits, to suggest vascular catastrophe, and pulses are equal in all 4 extremities.  CT scan obtained is negative for any acute findings.  Her pain radiates down to  her knees, though her knees are normal in appearance.  There is no warmth, erythema, effusion, or constitutional symptoms to suggest a septic joint.  She has full and normal range of motion of bilateral knees.  X-rays are normal.  Patient was treated symptomatically with significant improvement of her symptoms.  She requested a prescription for an oral version of something similar to Toradol and she was given naproxen.  Discussed care instructions and return precautions with patient. Recommended close outpatient follow-up for re-evaluation. Patient agrees with plan of care. Will treat the patient symptomatically as needed for pain control. Will discharge patient to take these medications and return for any worsening or different pain or development of any neurologic symptoms. Educated patient regarding expected time course for back pain to improve and recommended very close outpatient follow-up.  She plans on having follow-up with Dr. Yves Dill this week.  She was discharged in stable condition.  She is ambulatory with a steady gait.  Patient's presentation is most consistent with severe exacerbation of chronic illness.   Clinical Course as of 02/17/23 1434  Thu Feb 17, 2023   1225 Patient reevaluated, reports that her pain is significantly improved but not resolved [JP]    Clinical Course User Index [JP] Claudis Giovanelli, Herb Grays, PA-C     FINAL CLINICAL IMPRESSION(S) / ED DIAGNOSES   Final diagnoses:  Lumbar radiculopathy     Rx / DC Orders   ED Discharge Orders          Ordered    naproxen (NAPROSYN) 500 MG tablet  2 times daily with meals        02/17/23 1420             Note:  This document was prepared using Dragon voice recognition software and may include unintentional dictation errors.   Jackelyn Hoehn, PA-C 02/17/23 1434    Sharyn Creamer, MD 02/17/23 1526

## 2023-02-17 NOTE — Discharge Instructions (Addendum)
Your CT scan reveals no evidence of high-grade spinal canal or neuroforaminal stenosis.  This is similar to your MRI obtained in 2020.  Your knee x-rays are normal.  Please follow-up with your back doctor.  You may take the medication as prescribed in the meantime.  Please return for any new, worsening, or change in symptoms or other concerns. It was a pleasure caring for you today.

## 2023-02-17 NOTE — ED Triage Notes (Signed)
Pt comes with c/o bilateral leg pain. Pt states it started in her knees and has gotten worse going up and down. Pt also states some back pain. Pt states shot with her doctor for her sciatic.

## 2023-02-17 NOTE — ED Notes (Signed)
See triage note  Presents with bilateral knee pain  States pain woke her up around 3-4 this morning

## 2023-04-19 ENCOUNTER — Inpatient Hospital Stay: Payer: 59 | Attending: Oncology | Admitting: Oncology

## 2023-04-19 ENCOUNTER — Encounter: Payer: Self-pay | Admitting: Oncology

## 2023-04-19 ENCOUNTER — Inpatient Hospital Stay: Payer: 59

## 2023-04-19 VITALS — BP 127/108 | HR 89 | Temp 97.4°F | Resp 18 | Wt 211.4 lb

## 2023-04-19 DIAGNOSIS — D72828 Other elevated white blood cell count: Secondary | ICD-10-CM | POA: Diagnosis not present

## 2023-04-19 DIAGNOSIS — F17210 Nicotine dependence, cigarettes, uncomplicated: Secondary | ICD-10-CM | POA: Diagnosis not present

## 2023-04-19 LAB — CBC WITH DIFFERENTIAL/PLATELET
Abs Immature Granulocytes: 0.08 10*3/uL — ABNORMAL HIGH (ref 0.00–0.07)
Basophils Absolute: 0.1 10*3/uL (ref 0.0–0.1)
Basophils Relative: 0 %
Eosinophils Absolute: 0 10*3/uL (ref 0.0–0.5)
Eosinophils Relative: 0 %
HCT: 44.7 % (ref 36.0–46.0)
Hemoglobin: 15.3 g/dL — ABNORMAL HIGH (ref 12.0–15.0)
Immature Granulocytes: 1 %
Lymphocytes Relative: 18 %
Lymphs Abs: 2.5 10*3/uL (ref 0.7–4.0)
MCH: 31.4 pg (ref 26.0–34.0)
MCHC: 34.2 g/dL (ref 30.0–36.0)
MCV: 91.8 fL (ref 80.0–100.0)
Monocytes Absolute: 0.9 10*3/uL (ref 0.1–1.0)
Monocytes Relative: 7 %
Neutro Abs: 10.1 10*3/uL — ABNORMAL HIGH (ref 1.7–7.7)
Neutrophils Relative %: 74 %
Platelets: 391 10*3/uL (ref 150–400)
RBC: 4.87 MIL/uL (ref 3.87–5.11)
RDW: 13.7 % (ref 11.5–15.5)
WBC: 13.6 10*3/uL — ABNORMAL HIGH (ref 4.0–10.5)
nRBC: 0 % (ref 0.0–0.2)

## 2023-04-21 LAB — COMP PANEL: LEUKEMIA/LYMPHOMA

## 2023-04-24 ENCOUNTER — Encounter: Payer: Self-pay | Admitting: Oncology

## 2023-04-24 NOTE — Progress Notes (Signed)
Hematology/Oncology Consult note [facility] [phone] [phone]  Patient Care Team: Gracelyn Nurse, MD as PCP - General (Internal Medicine)   Name of the patient: Katherine Lester  [id number]  1956/04/25    Reason for referral-neutrophilia   Referring physician-Dr. Marcelino Duster  Date of visit: 04/24/23   History of presenting illness- Patient is a 67 year old female with a past medical history significant for hypertension, irritable bowel syndrome, GERD and depression among other medical problems.  She has been referred for leukocytosis/neutrophilia patient has had chronic leukocytosis at least dating back to October 2022 when her white cell count has been fluctuating mostly between 10-12.  On 04/07/2023 she was noted to have a white count of 15 and therefore she has been referred to [place].  Differential mainly shows neutrophilia with an absolute neutrophil count of 11.26   ECOG PS- 1  Pain scale- 0   Review of systems- Review of Systems  Constitutional:  Negative for chills, fever, malaise/fatigue and weight loss.  HENT:  Negative for congestion, ear discharge and nosebleeds.   Eyes:  Negative for blurred vision.  Respiratory:  Negative for cough, hemoptysis, sputum production, shortness of breath and wheezing.   Cardiovascular:  Negative for chest pain, palpitations, orthopnea and claudication.  Gastrointestinal:  Negative for abdominal pain, blood in stool, constipation, diarrhea, heartburn, melena, nausea and vomiting.  Genitourinary:  Negative for dysuria, flank pain, frequency, hematuria and urgency.  Musculoskeletal:  Negative for back pain, joint pain and myalgias.  Skin:  Negative for rash.  Neurological:  Negative for dizziness, tingling, focal weakness, seizures, weakness and headaches.  Endo/Heme/Allergies:  Does not bruise/bleed easily.  Psychiatric/Behavioral:  Negative for depression and suicidal ideas. The patient does  not have insomnia.     Allergies  Allergen Reactions   Pseudoephedrine Hcl Other (See Comments)   Citalopram Other (See Comments)   Gabapentin Other (See Comments)    Tremor/shaking   Codeine Nausea And Vomiting   Erythromycin Rash   Morphine Nausea And Vomiting   Tramadol Anxiety and Other (See Comments)    Patient Active Problem List   Diagnosis Date Noted   Greater trochanteric bursitis 09/02/2015   Degenerative disc disease, lumbar 11/13/2014   Facet syndrome, lumbar 11/13/2014   Sacroiliac joint dysfunction 11/13/2014     Past Medical History:  Diagnosis Date   Allergy    Anxiety    Cancer (HCC) 01/2020   basal cell   Chronic back pain    Depression    Generalized edema    GERD (gastroesophageal reflux disease)    Hypertension    IBS (irritable bowel syndrome)    Insomnia    Irritable bowel syndrome    Positional vertigo    Sinusitis    Spastic colon      Past Surgical History:  Procedure Laterality Date   ABDOMINAL HYSTERECTOMY     APPENDECTOMY     BREAST BIOPSY Right    stereo bx-neg   DILATION AND CURETTAGE OF UTERUS     FRACTURE SURGERY Left    arm    Social History   Socioeconomic History   Marital status: Married    Spouse name: Not on file   Number of children: Not on file   Years of education: Not on file   Highest education level: Not on file  Occupational History   Not on file  Tobacco Use   Smoking status: Every Day    Current packs/day: 0.00    Types:  Cigarettes    Start date: 10/14/1993    Last attempt to quit: 10/14/2013    Years since quitting: 9.5   Smokeless tobacco: Never  Vaping Use   Vaping status: Never Used  Substance and Sexual Activity   Alcohol use: No    Alcohol/week: 0.0 standard drinks of alcohol   Drug use: No   Sexual activity: Not Currently    Birth control/protection: Abstinence  Other Topics Concern   Not on file  Social History Narrative   Not on file   Social Determinants of Health    Financial Resource Strain: Low Risk  (04/07/2023)   Received from [facility]   Overall Financial Resource Strain (CARDIA)    Difficulty of Paying Living Expenses: Not very hard  Food Insecurity: No Food Insecurity (04/19/2023)   Hunger Vital Sign    Worried About Running Out of Food in the Last Year: Never true    Ran Out of Food in the Last Year: Never true  Transportation Needs: No Transportation Needs (04/07/2023)   Received from [facility] - Transportation    In the past 12 months, has lack of transportation kept you from medical appointments or from getting medications?: No    Lack of Transportation (Non-Medical): No  Physical Activity: Not on file  Stress: Not on file  Social Connections: Not on file  Intimate Partner Violence: Not At Risk (04/19/2023)   Humiliation, Afraid, Rape, and Kick questionnaire    Fear of Current or Ex-Partner: No    Emotionally Abused: No    Physically Abused: No    Sexually Abused: No     Family History  Problem Relation Age of Onset   Cancer Mother    COPD Mother    Depression Mother    Heart disease Mother    Stroke Mother    Drug abuse Brother    Alcohol abuse Brother    Mental illness Brother    Heart disease Maternal Grandmother    Breast cancer Neg Hx      Current Outpatient Medications:    busPIRone (BUSPAR) 10 MG tablet, Take 10 mg by mouth 3 (three) times daily., Disp: , Rfl:    Cholecalciferol (VITAMIN D3) 10000 UNITS capsule, Take 50,000 Units by mouth once a week. , Disp: , Rfl:    diltiazem (CARDIZEM CD) 120 MG 24 hr capsule, Take 120 mg by mouth daily., Disp: , Rfl:    DULoxetine (CYMBALTA) 60 MG capsule, Take 60 mg by mouth daily. , Disp: , Rfl:    LORazepam (ATIVAN) 0.5 MG tablet, , Disp: , Rfl:    pantoprazole (PROTONIX) 40 MG tablet, , Disp: , Rfl:    zolpidem (AMBIEN) 5 MG tablet, Take 5 mg by mouth at bedtime., Disp: , Rfl:    BREZTRI AEROSPHERE 160-9-4.8 MCG/ACT  AERO, Inhale into the lungs., Disp: , Rfl:    cetirizine (ZYRTEC) 10 MG tablet, Take 10 mg by mouth., Disp: , Rfl:    cyclobenzaprine (FLEXERIL) 10 MG tablet, Limit 1 tab by mouth 2-4 times a day if tolerated (Patient not taking: Reported on 07/13/2018), Disp: 100 tablet, Rfl: 0   DULoxetine (CYMBALTA) 20 MG capsule, Limit 1 capsule by mouth per day if tolerated (Patient not taking: Reported on 01/16/2018), Disp: 30 capsule, Rfl: 0   levofloxacin (LEVAQUIN) 500 MG tablet, Take 1 tablet by mouth daily., Disp: , Rfl:    ondansetron (ZOFRAN-ODT) 4 MG disintegrating tablet, Take 4 mg by mouth every  8 (eight) hours as needed for nausea or vomiting., Disp: , Rfl:    pantoprazole (PROTONIX) 40 MG tablet, Take 40 mg by mouth daily. , Disp: , Rfl:    polyethylene glycol (MIRALAX / GLYCOLAX) packet, , Disp: , Rfl:    prochlorperazine (COMPAZINE) 10 MG tablet, Take 1 tablet (10 mg total) by mouth every 8 (eight) hours as needed (headache)., Disp: 30 tablet, Rfl: 0   Vitamin D, Ergocalciferol, (DRISDOL) 50000 units CAPS capsule, , Disp: , Rfl:   Current Facility-Administered Medications:    bupivacaine (PF) (MARCAINE) 0.25 % injection 30 mL, 30 mL, Other, Once, Ewing Schlein, MD   bupivacaine (PF) (MARCAINE) 0.25 % injection 30 mL, 30 mL, Other, Once, Ewing Schlein, MD   bupivacaine (PF) (MARCAINE) 0.25 % injection 30 mL, 30 mL, Other, Once, Ewing Schlein, MD   bupivacaine (PF) (MARCAINE) 0.25 % injection 30 mL, 30 mL, Other, Once, Ewing Schlein, MD   fentaNYL (SUBLIMAZE) injection 100 mcg, 100 mcg, Intravenous, Once, Ewing Schlein, MD   fentaNYL (SUBLIMAZE) injection 100 mcg, 100 mcg, Intravenous, Once, Ewing Schlein, MD   lactated ringers infusion 1,000 mL, 1,000 mL, Intravenous, Continuous, Ewing Schlein, MD   lactated ringers infusion 1,000 mL, 1,000 mL, Intravenous, Continuous, Ewing Schlein, MD   lactated ringers infusion 1,000 mL, 1,000 mL, Intravenous, Continuous, Ewing Schlein, MD, Last Rate:  125 mL/hr at 10/15/15 0923, 1,000 mL at 10/15/15 6213   lactated ringers infusion 1,000 mL, 1,000 mL, Intravenous, Continuous, Ewing Schlein, MD, Last Rate: 125 mL/hr at 01/12/16 0950, 1,000 mL at 01/12/16 0950   midazolam (VERSED) 5 MG/5ML injection 5 mg, 5 mg, Intravenous, Once, Ewing Schlein, MD   midazolam (VERSED) 5 MG/5ML injection 5 mg, 5 mg, Intravenous, Once, Ewing Schlein, MD   orphenadrine (NORFLEX) injection 60 mg, 60 mg, Intramuscular, Once, Ewing Schlein, MD   orphenadrine (NORFLEX) injection 60 mg, 60 mg, Intramuscular, Once, Ewing Schlein, MD   orphenadrine (NORFLEX) injection 60 mg, 60 mg, Intramuscular, Once, Ewing Schlein, MD   orphenadrine (NORFLEX) injection 60 mg, 60 mg, Intramuscular, Once, Ewing Schlein, MD   sodium chloride flush (NS) 0.9 % injection 20 mL, 20 mL, Other, Once, Ewing Schlein, MD   sodium chloride flush (NS) 0.9 % injection 20 mL, 20 mL, Other, Once, Ewing Schlein, MD   triamcinolone acetonide (KENALOG-40) injection 40 mg, 40 mg, Other, Once, Ewing Schlein, MD   triamcinolone acetonide (KENALOG-40) injection 40 mg, 40 mg, Other, Once, Ewing Schlein, MD   triamcinolone acetonide (KENALOG-40) injection 40 mg, 40 mg, Other, Once, Ewing Schlein, MD   triamcinolone acetonide (KENALOG-40) injection 40 mg, 40 mg, Other, Once, Ewing Schlein, MD   triamcinolone acetonide (KENALOG-40) injection 40 mg, 40 mg, Other, Once, Ewing Schlein, MD   Physical exam:  Vitals:   04/19/23 1348 04/19/23 1350  BP: (!) 130/106 (!) 127/108  Pulse: 89   Resp: 18   Temp: (!) 97.4 F (36.3 C)   TempSrc: Tympanic   SpO2: 98%   Weight: 211 lb 6.4 oz (95.9 kg)    Physical Exam Cardiovascular:     Rate and Rhythm: Normal rate and regular rhythm.     Heart sounds: Normal heart sounds.  Pulmonary:     Effort: Pulmonary effort is normal.     Breath sounds: Normal breath sounds.  Abdominal:     General: Bowel sounds are normal.     Palpations: Abdomen is soft.      Comments: No palpable hepatosplenomegaly  Lymphadenopathy:     Comments: No  palpable cervical, supraclavicular, axillary or inguinal adenopathy    Skin:    General: Skin is warm and dry.  Neurological:     Mental Status: She is alert and oriented to person, place, and time.           Latest Ref Rng & Units 12/10/2021    2:21 PM  CMP  Glucose 70 - 99 mg/dL 409   BUN 8 - 23 mg/dL 10   Creatinine 8.11 - 1.00 mg/dL 9.14   Sodium 782 - 956 mmol/L 137   Potassium 3.5 - 5.1 mmol/L 3.8   Chloride 98 - 111 mmol/L 101   CO2 22 - 32 mmol/L 22   Calcium 8.9 - 10.3 mg/dL 9.9       Latest Ref Rng & Units 04/19/2023    2:24 PM  CBC  WBC 4.0 - 10.5 K/uL 13.6   Hemoglobin 12.0 - 15.0 g/dL 21.3   Hematocrit 08.6 - 46.0 % 44.7   Platelets 150 - 400 K/uL 391    Assessment and plan- Patient is a 67 y.o. female referred for neutrophilia  Neutrophilia- likely reactive. Overall it is mild and not associated with other cytopenias that would be suggestive of a myeloproliferative disorder or leukemia. I will check cbc with diff, flow cytometry today. I will see her back in 2-3 weeks for in person or video visit   Thank you for this kind referral and the opportunity to participate in the care of this patient   Visit Diagnosis 1. Neutrophilia     Dr. Owens Shark, MD, MPH [facility] at [facility] [phone] 04/24/2023

## 2023-05-06 ENCOUNTER — Inpatient Hospital Stay: Payer: 59 | Attending: Oncology | Admitting: Oncology

## 2023-05-06 ENCOUNTER — Encounter: Payer: Self-pay | Admitting: Oncology

## 2023-05-06 DIAGNOSIS — D72828 Other elevated white blood cell count: Secondary | ICD-10-CM

## 2023-05-12 NOTE — Progress Notes (Signed)
I connected with Katherine Lester on 05/12/23 at  3:15 PM EST by video enabled telemedicine visit and verified that I am speaking with the correct person using two identifiers.   I discussed the limitations, risks, security and privacy concerns of performing an evaluation and management service by telemedicine and the availability of in-person appointments. I also discussed with the patient that there may be a patient responsible charge related to this service. The patient expressed understanding and agreed to proceed.  Other persons participating in the visit and their role in the encounter:  none  Patient's location:  home Provider's location:  work  Stage manager Complaint:  discuss results fo bloodwork  History of present illness: Patient is a 67 year old female with a past medical history significant for hypertension, irritable bowel syndrome, GERD and depression among other medical problems.  She has been referred for leukocytosis/neutrophilia patient has had chronic leukocytosis at least dating back to October 2022 when her white cell count has been fluctuating mostly between 10-12.  On 04/07/2023 she was noted to have a white count of 15 and therefore she has been referred to [place].  Differential mainly shows neutrophilia with an absolute neutrophil count of 11.26   Results of blood work from 04/19/2023 were as follows: CBC showed white cell count of 13.6, H&H of 15.3/44.7 and a platelet count of 391.  Flow cytometry did not show any immunophenotypic abnormality.  Interval history: patient is doing well presently and denies any specific complaints at this time   Review of Systems  Constitutional:  Negative for chills, fever, malaise/fatigue and weight loss.  HENT:  Negative for congestion, ear discharge and nosebleeds.   Eyes:  Negative for blurred vision.  Respiratory:  Negative for cough, hemoptysis, sputum production, shortness of breath and wheezing.   Cardiovascular:  Negative for chest pain,  palpitations, orthopnea and claudication.  Gastrointestinal:  Negative for abdominal pain, blood in stool, constipation, diarrhea, heartburn, melena, nausea and vomiting.  Genitourinary:  Negative for dysuria, flank pain, frequency, hematuria and urgency.  Musculoskeletal:  Negative for back pain, joint pain and myalgias.  Skin:  Negative for rash.  Neurological:  Negative for dizziness, tingling, focal weakness, seizures, weakness and headaches.  Endo/Heme/Allergies:  Does not bruise/bleed easily.  Psychiatric/Behavioral:  Negative for depression and suicidal ideas. The patient does not have insomnia.     Allergies  Allergen Reactions   Pseudoephedrine Hcl Other (See Comments)   Citalopram Other (See Comments)   Gabapentin Other (See Comments)    Tremor/shaking   Codeine Nausea And Vomiting   Erythromycin Rash   Morphine Nausea And Vomiting   Tramadol Anxiety and Other (See Comments)    Past Medical History:  Diagnosis Date   Allergy    Anxiety    Cancer (HCC) 01/2020   basal cell   Chronic back pain    Depression    Generalized edema    GERD (gastroesophageal reflux disease)    Hypertension    IBS (irritable bowel syndrome)    Insomnia    Irritable bowel syndrome    Positional vertigo    Sinusitis    Spastic colon     Past Surgical History:  Procedure Laterality Date   ABDOMINAL HYSTERECTOMY     APPENDECTOMY     BREAST BIOPSY Right    stereo bx-neg   DILATION AND CURETTAGE OF UTERUS     FRACTURE SURGERY Left    arm    Social History   Socioeconomic History   Marital status: Married  Spouse name: Not on file   Number of children: Not on file   Years of education: Not on file   Highest education level: Not on file  Occupational History   Not on file  Tobacco Use   Smoking status: Every Day    Current packs/day: 0.00    Types: Cigarettes    Start date: 10/14/1993    Last attempt to quit: 10/14/2013    Years since quitting: 9.5   Smokeless tobacco:  Never  Vaping Use   Vaping status: Never Used  Substance and Sexual Activity   Alcohol use: No    Alcohol/week: 0.0 standard drinks of alcohol   Drug use: No   Sexual activity: Not Currently    Birth control/protection: Abstinence  Other Topics Concern   Not on file  Social History Narrative   Not on file   Social Drivers of Health   Financial Resource Strain: Low Risk  (04/07/2023)   Received from [facility]   Overall Financial Resource Strain (CARDIA)    Difficulty of Paying Living Expenses: Not very hard  Food Insecurity: No Food Insecurity (04/19/2023)   Hunger Vital Sign    Worried About Running Out of Food in the Last Year: Never true    Ran Out of Food in the Last Year: Never true  Transportation Needs: No Transportation Needs (04/07/2023)   Received from [facility]   PRAPARE - Transportation    In the past 12 months, has lack of transportation kept you from medical appointments or from getting medications?: No    Lack of Transportation (Non-Medical): No  Physical Activity: Not on file  Stress: Not on file  Social Connections: Not on file  Intimate Partner Violence: Not At Risk (04/19/2023)   Humiliation, Afraid, Rape, and Kick questionnaire    Fear of Current or Ex-Partner: No    Emotionally Abused: No    Physically Abused: No    Sexually Abused: No    Family History  Problem Relation Age of Onset   Cancer Mother    COPD Mother    Depression Mother    Heart disease Mother    Stroke Mother    Drug abuse Brother    Alcohol abuse Brother    Mental illness Brother    Heart disease Maternal Grandmother    Breast cancer Neg Hx      Current Outpatient Medications:    BREZTRI AEROSPHERE 160-9-4.8 MCG/ACT AERO, Inhale into the lungs., Disp: , Rfl:    busPIRone (BUSPAR) 10 MG tablet, Take 10 mg by mouth 3 (three) times daily., Disp: , Rfl:    cetirizine (ZYRTEC) 10 MG tablet, Take 10 mg by mouth., Disp: , Rfl:     Cholecalciferol (VITAMIN D3) 10000 UNITS capsule, Take 50,000 Units by mouth once a week. , Disp: , Rfl:    cyclobenzaprine (FLEXERIL) 10 MG tablet, Limit 1 tab by mouth 2-4 times a day if tolerated (Patient not taking: Reported on 07/13/2018), Disp: 100 tablet, Rfl: 0   diltiazem (CARDIZEM CD) 120 MG 24 hr capsule, Take 120 mg by mouth daily., Disp: , Rfl:    DULoxetine (CYMBALTA) 20 MG capsule, Limit 1 capsule by mouth per day if tolerated (Patient not taking: Reported on 01/16/2018), Disp: 30 capsule, Rfl: 0   DULoxetine (CYMBALTA) 60 MG capsule, Take 60 mg by mouth daily. , Disp: , Rfl:    levofloxacin (LEVAQUIN) 500 MG tablet, Take 1 tablet by mouth daily., Disp: , Rfl:  LORazepam (ATIVAN) 0.5 MG tablet, , Disp: , Rfl:    ondansetron (ZOFRAN-ODT) 4 MG disintegrating tablet, Take 4 mg by mouth every 8 (eight) hours as needed for nausea or vomiting., Disp: , Rfl:    pantoprazole (PROTONIX) 40 MG tablet, Take 40 mg by mouth daily. , Disp: , Rfl:    pantoprazole (PROTONIX) 40 MG tablet, , Disp: , Rfl:    polyethylene glycol (MIRALAX / GLYCOLAX) packet, , Disp: , Rfl:    prochlorperazine (COMPAZINE) 10 MG tablet, Take 1 tablet (10 mg total) by mouth every 8 (eight) hours as needed (headache)., Disp: 30 tablet, Rfl: 0   Vitamin D, Ergocalciferol, (DRISDOL) 50000 units CAPS capsule, , Disp: , Rfl:    zolpidem (AMBIEN) 5 MG tablet, Take 5 mg by mouth at bedtime., Disp: , Rfl:   Current Facility-Administered Medications:    bupivacaine (PF) (MARCAINE) 0.25 % injection 30 mL, 30 mL, Other, Once, Ewing Schlein, MD   bupivacaine (PF) (MARCAINE) 0.25 % injection 30 mL, 30 mL, Other, Once, Ewing Schlein, MD   bupivacaine (PF) (MARCAINE) 0.25 % injection 30 mL, 30 mL, Other, Once, Ewing Schlein, MD   bupivacaine (PF) (MARCAINE) 0.25 % injection 30 mL, 30 mL, Other, Once, Ewing Schlein, MD   fentaNYL (SUBLIMAZE) injection 100 mcg, 100 mcg, Intravenous, Once, Ewing Schlein, MD   fentaNYL (SUBLIMAZE)  injection 100 mcg, 100 mcg, Intravenous, Once, Ewing Schlein, MD   lactated ringers infusion 1,000 mL, 1,000 mL, Intravenous, Continuous, Ewing Schlein, MD   lactated ringers infusion 1,000 mL, 1,000 mL, Intravenous, Continuous, Ewing Schlein, MD   lactated ringers infusion 1,000 mL, 1,000 mL, Intravenous, Continuous, Ewing Schlein, MD, Last Rate: 125 mL/hr at 10/15/15 0923, 1,000 mL at 10/15/15 8119   lactated ringers infusion 1,000 mL, 1,000 mL, Intravenous, Continuous, Ewing Schlein, MD, Last Rate: 125 mL/hr at 01/12/16 0950, 1,000 mL at 01/12/16 0950   midazolam (VERSED) 5 MG/5ML injection 5 mg, 5 mg, Intravenous, Once, Ewing Schlein, MD   midazolam (VERSED) 5 MG/5ML injection 5 mg, 5 mg, Intravenous, Once, Ewing Schlein, MD   orphenadrine (NORFLEX) injection 60 mg, 60 mg, Intramuscular, Once, Ewing Schlein, MD   orphenadrine (NORFLEX) injection 60 mg, 60 mg, Intramuscular, Once, Ewing Schlein, MD   orphenadrine (NORFLEX) injection 60 mg, 60 mg, Intramuscular, Once, Ewing Schlein, MD   orphenadrine (NORFLEX) injection 60 mg, 60 mg, Intramuscular, Once, Ewing Schlein, MD   sodium chloride flush (NS) 0.9 % injection 20 mL, 20 mL, Other, Once, Ewing Schlein, MD   sodium chloride flush (NS) 0.9 % injection 20 mL, 20 mL, Other, Once, Ewing Schlein, MD   triamcinolone acetonide (KENALOG-40) injection 40 mg, 40 mg, Other, Once, Ewing Schlein, MD   triamcinolone acetonide (KENALOG-40) injection 40 mg, 40 mg, Other, Once, Ewing Schlein, MD   triamcinolone acetonide (KENALOG-40) injection 40 mg, 40 mg, Other, Once, Ewing Schlein, MD   triamcinolone acetonide (KENALOG-40) injection 40 mg, 40 mg, Other, Once, Ewing Schlein, MD   triamcinolone acetonide (KENALOG-40) injection 40 mg, 40 mg, Other, Once, Ewing Schlein, MD  No results found.  No images are attached to the encounter.      Latest Ref Rng & Units 12/10/2021    2:21 PM  CMP  Glucose 70 - 99 mg/dL 147   BUN 8 - 23 mg/dL  10   Creatinine 8.29 - 1.00 mg/dL 5.62   Sodium 130 - 865 mmol/L 137   Potassium 3.5 - 5.1 mmol/L 3.8   Chloride 98 - 111 mmol/L 101  CO2 22 - 32 mmol/L 22   Calcium 8.9 - 10.3 mg/dL 9.9       Latest Ref Rng & Units 04/19/2023    2:24 PM  CBC  WBC 4.0 - 10.5 K/uL 13.6   Hemoglobin 12.0 - 15.0 g/dL 96.0   Hematocrit 45.4 - 46.0 % 44.7   Platelets 150 - 400 K/uL 391      Observation/objective: Appears in no acute distress over video visit today.  Breathing is nonlabored  Assessment and plan: Patient is a 67 year old female referred for leukocytosis/neutrophilia  Discussed the results of flow cytometry which does not show any evidence of immunophenotypic abnormality.  Suspect leukocytosis/neutrophilia is reactive and she does not require a bone marrow biopsy for this at this time.  She can continue to follow-up with her primary care doctor and can be referred to [place] in the future if questions or concerns arise if there is a consistent increase in her white cell count  Follow-up instructions:no f/u needed  I discussed the assessment and treatment plan with the patient. The patient was provided an opportunity to ask questions and all were answered. The patient agreed with the plan and demonstrated an understanding of the instructions.   The patient was advised to call back or seek an in-person evaluation if the symptoms worsen or if the condition fails to improve as anticipated.  I provided 11 minutes of face-to-face video visit time during this encounter, and > 50% was spent counseling as documented under my assessment & plan.  Visit Diagnosis: 1. Neutrophilia     Dr. Owens Shark, MD, MPH [facility] at [facility] Tel- [phone] 05/12/2023 1:01 PM

## 2023-08-26 ENCOUNTER — Other Ambulatory Visit: Payer: Self-pay | Admitting: Physical Medicine and Rehabilitation

## 2023-08-26 DIAGNOSIS — M5416 Radiculopathy, lumbar region: Secondary | ICD-10-CM

## 2023-09-03 ENCOUNTER — Ambulatory Visit
Admission: RE | Admit: 2023-09-03 | Discharge: 2023-09-03 | Disposition: A | Discharge status: Home / Self Care | Source: Ambulatory Visit | Attending: Physical Medicine and Rehabilitation | Admitting: Physical Medicine and Rehabilitation

## 2023-09-03 DIAGNOSIS — M5416 Radiculopathy, lumbar region: Secondary | ICD-10-CM

## 2023-11-20 ENCOUNTER — Other Ambulatory Visit: Payer: Self-pay

## 2023-11-20 ENCOUNTER — Emergency Department
Admission: EM | Admit: 2023-11-20 | Discharge: 2023-11-21 | Disposition: A | Discharge status: Home / Self Care | Attending: Emergency Medicine | Admitting: Emergency Medicine

## 2023-11-20 ENCOUNTER — Emergency Department

## 2023-11-20 DIAGNOSIS — R0789 Other chest pain: Secondary | ICD-10-CM | POA: Diagnosis present

## 2023-11-20 DIAGNOSIS — R079 Chest pain, unspecified: Secondary | ICD-10-CM

## 2023-11-20 LAB — BASIC METABOLIC PANEL WITH GFR
Anion gap: 9 (ref 5–15)
BUN: 11 mg/dL (ref 8–23)
CO2: 25 mmol/L (ref 22–32)
Calcium: 9.7 mg/dL (ref 8.9–10.3)
Chloride: 104 mmol/L (ref 98–111)
Creatinine, Ser: 0.71 mg/dL (ref 0.44–1.00)
GFR, Estimated: >60 mL/min (ref >60)
Glucose, Bld: 137 mg/dL — ABNORMAL HIGH (ref 70–99)
Potassium: 3.8 mmol/L (ref 3.5–5.1)
Sodium: 138 mmol/L (ref 135–145)

## 2023-11-20 LAB — CBC
HCT: 44.1 % (ref 36.0–46.0)
Hemoglobin: 14.9 g/dL (ref 12.0–15.0)
MCH: 31 pg (ref 26.0–34.0)
MCHC: 33.8 g/dL (ref 30.0–36.0)
MCV: 91.9 fL (ref 80.0–100.0)
Platelets: 419 10*3/uL — ABNORMAL HIGH (ref 150–400)
RBC: 4.8 MIL/uL (ref 3.87–5.11)
RDW: 13.2 % (ref 11.5–15.5)
WBC: 12.6 10*3/uL — ABNORMAL HIGH (ref 4.0–10.5)
nRBC: 0 % (ref 0.0–0.2)

## 2023-11-20 LAB — TROPONIN I (HIGH SENSITIVITY): Troponin I (High Sensitivity): 5 ng/L (ref <18)

## 2023-11-20 MED ORDER — FENTANYL CITRATE PF 50 MCG/ML IJ SOSY
100.0000 ug | PREFILLED_SYRINGE | Freq: Once | INTRAMUSCULAR | Status: AC
  Administered 2023-11-20: 100 ug via INTRAVENOUS
  Filled 2023-11-20: qty 2

## 2023-11-20 MED ORDER — ONDANSETRON HCL 4 MG/2ML IJ SOLN
4.0000 mg | Freq: Once | INTRAMUSCULAR | Status: AC
  Administered 2023-11-20: 4 mg via INTRAVENOUS
  Filled 2023-11-20: qty 2

## 2023-11-20 MED ORDER — IOHEXOL 350 MG/ML SOLN
75.0000 mL | Freq: Once | INTRAVENOUS | Status: AC | PRN
  Administered 2023-11-20: 75 mL via INTRAVENOUS

## 2023-11-20 NOTE — ED Provider Notes (Signed)
 [facility] Provider Note    Event Date/Time   First MD Initiated Contact with Patient 11/20/23 2108     (approximate)  History   Chief Complaint: Chest Pain  HPI  Katherine Lester is a 68 y.o. female with a past medical history of anxiety, depression, gastric reflux, chronic back pain, presents to the emergency department for left-sided chest pain.  According to the patient for the past 3 days she has been experiencing sharp pain in the left side of her chest.  Patient states the pain is worse with movement or deep inspiration.  Patient states she is scheduled for nerve block tomorrow for back pain she has been experiencing however she states the pain was worsening in her chest she was worried so she came to the emergency department for evaluation.  Physical Exam   Triage Vital Signs: ED Triage Vitals  Encounter Vitals Group     BP 11/20/23 1930 (!) 140/95     Girls Systolic BP Percentile --      Girls Diastolic BP Percentile --      Boys Systolic BP Percentile --      Boys Diastolic BP Percentile --      Pulse Rate 11/20/23 1930 (!) 105     Resp 11/20/23 1930 20     Temp 11/20/23 1930 98.2 F (36.8 C)     Temp Source 11/20/23 1930 Oral     SpO2 11/20/23 1930 97 %     Weight 11/20/23 1928 210 lb (95.3 kg)     Height 11/20/23 1928 5' 6 (1.676 m)     Head Circumference --      Peak Flow --      Pain Score 11/20/23 1928 9     Pain Loc --      Pain Education --      Exclude from Growth Chart --     Most recent vital signs: Vitals:   11/20/23 1930  BP: (!) 140/95  Pulse: (!) 105  Resp: 20  Temp: 98.2 F (36.8 C)  SpO2: 97%    General: Awake, no distress.  CV:  Good peripheral perfusion.  Regular rate and rhythm  Resp:  Normal effort.  Equal breath sounds bilaterally.  Left-sided chest wall tenderness to palpation. Abd:  No distention.  Soft, nontender.  No rebound or guarding.  ED Results / Procedures / Treatments   EKG  EKG viewed  and interpreted by myself shows sinus tachycardia 114 bpm with a narrow QRS, normal axis, normal intervals, nonspecific ST changes.  RADIOLOGY  I have reviewed and interpreted chest x-ray images.  No consolidation on my evaluation. Radiology is read the x-ray is negative   MEDICATIONS ORDERED IN ED: Medications  fentaNYL  (SUBLIMAZE ) injection 100 mcg (has no administration in time range)  ondansetron  (ZOFRAN ) injection 4 mg (has no administration in time range)     IMPRESSION / MDM / ASSESSMENT AND PLAN / ED COURSE  I reviewed the triage vital signs and the nursing notes.  Patient's presentation is most consistent with acute presentation with potential threat to life or bodily function.  Patient presents to the emergency department for chest pain.  Patient describes the pain as left-sided worse with inspiration or movement.  Patient has a history of chronic back pain but states she has only ever had pain in the chest 1 time previously due to pleurisy.  Patient's lab work today shows a reassuring CBC reassuring chemistry and a negative troponin.  However given the pleuritic nature of the pain with an elevated heart rate around 100 provide we will obtain a CTA of the chest to rule out PE.  Patient admits she is very anxious which could account for the elevated heart rate.  The remainder of the workup is reassuring.  Patient agreeable to plan.  Patient CTA has resulted negative for PE.  Suspect that this could be more chest wall discomfort given worsening with inspiration or movement.  We will discharge home.  Patient agreeable to plan of care.  Provided my typical chest pain return precautions.  FINAL CLINICAL IMPRESSION(S) / ED DIAGNOSES   Chest pain   Note:  This document was prepared using Dragon voice recognition software and may include unintentional dictation errors.   Dorothyann Drivers, MD 11/20/23 2251

## 2023-11-20 NOTE — Discharge Instructions (Addendum)
 Please follow-up with your doctor regarding your chest pain symptoms.  Return to the emergency department for any worsening chest pain trouble breathing or any other symptom personally concerning to yourself.

## 2023-11-20 NOTE — ED Triage Notes (Addendum)
 Pt to ED via POV c/o left sided chest pain that radiates to her side. Started Thursday and had been getting worse since. Worsens with movement. Hx HTN and COPD. Also reports back pain but is chronic

## 2023-11-22 ENCOUNTER — Other Ambulatory Visit: Payer: Self-pay | Admitting: Pulmonary Disease

## 2023-11-22 DIAGNOSIS — F17210 Nicotine dependence, cigarettes, uncomplicated: Secondary | ICD-10-CM

## 2023-11-22 DIAGNOSIS — G4733 Obstructive sleep apnea (adult) (pediatric): Secondary | ICD-10-CM

## 2023-12-07 ENCOUNTER — Ambulatory Visit

## 2023-12-08 ENCOUNTER — Emergency Department
Admission: EM | Admit: 2023-12-08 | Discharge: 2023-12-08 | Disposition: A | Discharge status: Home / Self Care | Attending: Emergency Medicine | Admitting: Emergency Medicine

## 2023-12-08 ENCOUNTER — Other Ambulatory Visit: Payer: Self-pay

## 2023-12-08 ENCOUNTER — Emergency Department

## 2023-12-08 DIAGNOSIS — M25551 Pain in right hip: Secondary | ICD-10-CM | POA: Insufficient documentation

## 2023-12-08 DIAGNOSIS — Y92019 Unspecified place in single-family (private) house as the place of occurrence of the external cause: Secondary | ICD-10-CM | POA: Insufficient documentation

## 2023-12-08 DIAGNOSIS — W19XXXA Unspecified fall, initial encounter: Secondary | ICD-10-CM

## 2023-12-08 DIAGNOSIS — I10 Essential (primary) hypertension: Secondary | ICD-10-CM | POA: Insufficient documentation

## 2023-12-08 DIAGNOSIS — R42 Dizziness and giddiness: Secondary | ICD-10-CM | POA: Insufficient documentation

## 2023-12-08 DIAGNOSIS — Z79899 Other long term (current) drug therapy: Secondary | ICD-10-CM | POA: Insufficient documentation

## 2023-12-08 DIAGNOSIS — R0789 Other chest pain: Secondary | ICD-10-CM | POA: Insufficient documentation

## 2023-12-08 DIAGNOSIS — S81011A Laceration without foreign body, right knee, initial encounter: Secondary | ICD-10-CM | POA: Insufficient documentation

## 2023-12-08 DIAGNOSIS — Z85820 Personal history of malignant melanoma of skin: Secondary | ICD-10-CM | POA: Insufficient documentation

## 2023-12-08 DIAGNOSIS — S51011A Laceration without foreign body of right elbow, initial encounter: Secondary | ICD-10-CM | POA: Insufficient documentation

## 2023-12-08 DIAGNOSIS — T148XXA Other injury of unspecified body region, initial encounter: Secondary | ICD-10-CM

## 2023-12-08 DIAGNOSIS — W07XXXA Fall from chair, initial encounter: Secondary | ICD-10-CM | POA: Diagnosis not present

## 2023-12-08 DIAGNOSIS — S8991XA Unspecified injury of right lower leg, initial encounter: Secondary | ICD-10-CM | POA: Diagnosis present

## 2023-12-08 LAB — URINALYSIS, ROUTINE W REFLEX MICROSCOPIC
Bilirubin Urine: NEGATIVE
Glucose, UA: NEGATIVE mg/dL
Hgb urine dipstick: NEGATIVE
Ketones, ur: NEGATIVE mg/dL
Nitrite: NEGATIVE
Protein, ur: NEGATIVE mg/dL
Specific Gravity, Urine: 1.026 (ref 1.005–1.030)
pH: 5 (ref 5.0–8.0)

## 2023-12-08 LAB — CBC WITH DIFFERENTIAL/PLATELET
Abs Immature Granulocytes: 0.08 K/uL — ABNORMAL HIGH (ref 0.00–0.07)
Basophils Absolute: 0.1 K/uL (ref 0.0–0.1)
Basophils Relative: 0 %
Eosinophils Absolute: 0.1 K/uL (ref 0.0–0.5)
Eosinophils Relative: 1 %
HCT: 43.5 % (ref 36.0–46.0)
Hemoglobin: 14.4 g/dL (ref 12.0–15.0)
Immature Granulocytes: 1 %
Lymphocytes Relative: 24 %
Lymphs Abs: 2.8 K/uL (ref 0.7–4.0)
MCH: 31 pg (ref 26.0–34.0)
MCHC: 33.1 g/dL (ref 30.0–36.0)
MCV: 93.5 fL (ref 80.0–100.0)
Monocytes Absolute: 0.8 K/uL (ref 0.1–1.0)
Monocytes Relative: 7 %
Neutro Abs: 8 K/uL — ABNORMAL HIGH (ref 1.7–7.7)
Neutrophils Relative %: 67 %
Platelets: 333 K/uL (ref 150–400)
RBC: 4.65 MIL/uL (ref 3.87–5.11)
RDW: 13.4 % (ref 11.5–15.5)
WBC: 11.8 K/uL — ABNORMAL HIGH (ref 4.0–10.5)
nRBC: 0 % (ref 0.0–0.2)

## 2023-12-08 LAB — TROPONIN I (HIGH SENSITIVITY): Troponin I (High Sensitivity): 4 ng/L (ref <18)

## 2023-12-08 LAB — COMPREHENSIVE METABOLIC PANEL WITH GFR
ALT: 23 U/L (ref 0–44)
AST: 25 U/L (ref 15–41)
Albumin: 3.4 g/dL — ABNORMAL LOW (ref 3.5–5.0)
Alkaline Phosphatase: 113 U/L (ref 38–126)
Anion gap: 13 (ref 5–15)
BUN: 12 mg/dL (ref 8–23)
CO2: 24 mmol/L (ref 22–32)
Calcium: 9.4 mg/dL (ref 8.9–10.3)
Chloride: 100 mmol/L (ref 98–111)
Creatinine, Ser: 0.52 mg/dL (ref 0.44–1.00)
GFR, Estimated: >60 mL/min (ref >60)
Glucose, Bld: 105 mg/dL — ABNORMAL HIGH (ref 70–99)
Potassium: 4.1 mmol/L (ref 3.5–5.1)
Sodium: 137 mmol/L (ref 135–145)
Total Bilirubin: 0.5 mg/dL (ref 0.0–1.2)
Total Protein: 7.3 g/dL (ref 6.5–8.1)

## 2023-12-08 MED ORDER — BACITRACIN ZINC 500 UNIT/GM EX OINT
TOPICAL_OINTMENT | Freq: Once | CUTANEOUS | Status: AC
  Administered 2023-12-08: 1 via TOPICAL
  Filled 2023-12-08: qty 0.9

## 2023-12-08 MED ORDER — ONDANSETRON HCL 4 MG/2ML IJ SOLN
4.0000 mg | Freq: Once | INTRAMUSCULAR | Status: AC
  Administered 2023-12-08: 4 mg via INTRAVENOUS
  Filled 2023-12-08: qty 2

## 2023-12-08 MED ORDER — LIDOCAINE-EPINEPHRINE (PF) 2 %-1:200000 IJ SOLN
20.0000 mL | Freq: Once | INTRAMUSCULAR | Status: AC
  Administered 2023-12-08: 20 mL via INTRADERMAL
  Filled 2023-12-08: qty 20

## 2023-12-08 MED ORDER — SODIUM CHLORIDE 0.9 % IV BOLUS (SEPSIS)
1000.0000 mL | Freq: Once | INTRAVENOUS | Status: AC
  Administered 2023-12-08: 1000 mL via INTRAVENOUS

## 2023-12-08 MED ORDER — FENTANYL CITRATE PF 50 MCG/ML IJ SOSY
50.0000 ug | PREFILLED_SYRINGE | Freq: Once | INTRAMUSCULAR | Status: AC
  Administered 2023-12-08: 50 ug via INTRAVENOUS
  Filled 2023-12-08: qty 1

## 2023-12-08 MED ORDER — LIDOCAINE-PRILOCAINE 2.5-2.5 % EX CREA
TOPICAL_CREAM | Freq: Once | CUTANEOUS | Status: AC
  Filled 2023-12-08: qty 5

## 2023-12-08 NOTE — ED Provider Notes (Signed)
 Care assumed of patient from outgoing provider.  See their note for initial history, exam and plan.  Clinical Course as of 12/08/23 0756  Thu Dec 08, 2023  0703 Presents with nonsyncopal fall - arm injury and multiple large skin tears.  Ongoing lightheadedness and some chest pain.  Recent CTA w/o PE or PNA. Trop neg. EKG w/ ischemia. CT head and XR neg.  [SM]    Clinical Course User Index [SM] Suzanne Kirsch, MD   Large knee skin tear/laceration that was repaired at bedside.  Patient states that she is feeling better.  Currently waiting for urine and if urine is reassuring will discharge home with knee immobilizer and wound care instructions.  Discussed close follow-up with primary care physician and return for any worsening or new symptoms.   Suzanne Kirsch, MD 12/08/23 [phone]

## 2023-12-08 NOTE — ED Triage Notes (Addendum)
 Pt presents via POV c/o mechanical fall at home. Reports fell while getting out of her chair. Denies LOC or hitting head. Skin tear noted to right knee and right elbow. Bleeding controlled at this time.

## 2023-12-08 NOTE — ED Notes (Signed)
 Pts elbow wrapped to control bleeding

## 2023-12-08 NOTE — ED Notes (Signed)
 EDP at bedside

## 2023-12-08 NOTE — ED Provider Notes (Signed)
 [facility] Provider Note    Event Date/Time   First MD Initiated Contact with Patient 12/08/23 [phone]     (approximate)   History   Fall, Dizziness, and Chest Pain   HPI  Katherine Lester is a 68 y.o. female with history of hypertension, anxiety, chronic back pain who presents to the emergency department with a fall.  She reports that her right leg gave out on her and she fell getting out of her chair tonight.  Has a large skin tear to the right knee and right elbow.  Also complaining of right hip pain and states she did hit the right side of her head but did not lose consciousness and is not on any blood thinners.  She denies any numbness, tingling or focal weakness.  States her tetanus vaccine is up-to-date.  She does report intermittent left-sided chest pain, shortness of breath and felt lightheaded today.  Has been eating and drinking normally.  No fevers, cough, vomiting, diarrhea, dysuria.  States she had chest pain that was similar recently and had negative CTA of the chest on 11/20/2023.  She saw her PCP and was diagnosed with costochondritis.  Reports she was given an injection of steroids and pain has improved but not completely resolved.   States she has also not been sleeping well which she thinks could be contributing to why she felt lightheaded today.  History provided by patient and family.    Past Medical History:  Diagnosis Date   Allergy    Anxiety    Cancer (HCC) 01/2020   basal cell   Chronic back pain    Depression    Generalized edema    GERD (gastroesophageal reflux disease)    Hypertension    IBS (irritable bowel syndrome)    Insomnia    Irritable bowel syndrome    Positional vertigo    Sinusitis    Spastic colon     Past Surgical History:  Procedure Laterality Date   ABDOMINAL HYSTERECTOMY     APPENDECTOMY     BREAST BIOPSY Right    stereo bx-neg   DILATION AND CURETTAGE OF UTERUS     FRACTURE SURGERY Left    arm     MEDICATIONS:  Prior to Admission medications   Medication Sig Start Date End Date Taking? Authorizing Provider  BREZTRI AEROSPHERE 160-9-4.8 MCG/ACT AERO Inhale into the lungs.    [provider]  busPIRone (BUSPAR) 10 MG tablet Take 10 mg by mouth 3 (three) times daily.    [provider]  cetirizine (ZYRTEC) 10 MG tablet Take 10 mg by mouth.    [provider]  Cholecalciferol (VITAMIN D3) 10000 UNITS capsule Take 50,000 Units by mouth once a week.     [provider]  cyclobenzaprine  (FLEXERIL ) 10 MG tablet Limit 1 tab by mouth 2-4 times a day if tolerated Patient not taking: Reported on 07/13/2018 02/09/16   Dannial Hacker, MD  diltiazem (CARDIZEM CD) 120 MG 24 hr capsule Take 120 mg by mouth daily. 04/04/23   [provider]  DULoxetine  (CYMBALTA ) 20 MG capsule Limit 1 capsule by mouth per day if tolerated Patient not taking: Reported on 01/16/2018 02/09/16   Dannial Hacker, MD  DULoxetine  (CYMBALTA ) 60 MG capsule Take 60 mg by mouth daily.  10/26/17   [provider]  levofloxacin (LEVAQUIN) 500 MG tablet Take 1 tablet by mouth daily. 03/01/17   [provider]  LORazepam (ATIVAN) 0.5 MG tablet  06/23/18  [provider]  ondansetron  (ZOFRAN -ODT) 4 MG disintegrating tablet Take 4 mg by mouth every 8 (eight) hours as needed for nausea or vomiting.    [provider]  pantoprazole (PROTONIX) 40 MG tablet Take 40 mg by mouth daily.  09/29/16 09/29/17  [provider]  pantoprazole (PROTONIX) 40 MG tablet  10/27/17   [provider]  polyethylene glycol (MIRALAX / GLYCOLAX) packet  09/06/16   [provider]  prochlorperazine  (COMPAZINE ) 10 MG tablet Take 1 tablet (10 mg total) by mouth every 8 (eight) hours as needed (headache). 12/10/21   Goodman, Graydon, MD  Vitamin D, Ergocalciferol, (DRISDOL) 50000 units CAPS capsule  11/28/17   [provider]  zolpidem (AMBIEN) 5 MG tablet Take 5  mg by mouth at bedtime.    [provider]    Physical Exam   Triage Vital Signs: ED Triage Vitals  Encounter Vitals Group     BP 12/08/23 0214 129/81     Girls Systolic BP Percentile --      Girls Diastolic BP Percentile --      Boys Systolic BP Percentile --      Boys Diastolic BP Percentile --      Pulse Rate 12/08/23 0214 94     Resp 12/08/23 0214 17     Temp 12/08/23 0214 98.6 F (37 C)     Temp Source 12/08/23 0214 Oral     SpO2 12/08/23 0214 95 %     Weight 12/08/23 0213 205 lb (93 kg)     Height 12/08/23 0213 5' 6 (1.676 m)     Head Circumference --      Peak Flow --      Pain Score 12/08/23 0211 0     Pain Loc --      Pain Education --      Exclude from Growth Chart --     Most recent vital signs: Vitals:   12/08/23 0700 12/08/23 0701  BP: 127/73   Pulse: 69   Resp: (!) 21 16  Temp:    SpO2: 100%      CONSTITUTIONAL: Alert, responds appropriately to questions. Well-appearing; well-nourished; GCS 15 HEAD: Normocephalic; atraumatic EYES: Conjunctivae clear, PERRL, EOMI ENT: normal nose; no rhinorrhea; moist mucous membranes; pharynx without lesions noted; no dental injury; no septal hematoma, no epistaxis; no facial deformity or bony tenderness NECK: Supple, no midline spinal tenderness, step-off or deformity; trachea midline CARD: RRR; S1 and S2 appreciated; no murmurs, no clicks, no rubs, no gallops RESP: Normal chest excursion without splinting or tachypnea; breath sounds clear and equal bilaterally; no wheezes, no rhonchi, no rales; no hypoxia or respiratory distress CHEST:  chest wall stable, no crepitus or ecchymosis or deformity, nontender to palpation; no flail chest ABD/GI: Non-distended; soft, non-tender, no rebound, no guarding; no ecchymosis or other lesions noted PELVIS:  stable, nontender to palpation BACK:  The back appears normal; no midline spinal tenderness, step-off or deformity EXT: Tender to palpation over the right knee, right  elbow, right hip.  Large skin tears noted to the right elbow and right knee.  Skin completely avulsed from the right elbow wound.  No bony involvement, tendon involvement.  This is very superficial.  She has a 10 cm skin tear/laceration to the right knee that does not involve the joint space.  No bony involvement there either.  No joint effusion.  Compartments soft.  Extremities warm and well-perfused. SKIN: Normal color for age and race; warm NEURO: No facial  asymmetry, normal speech, moving all extremities equally  ED Results / Procedures / Treatments   LABS: (all labs ordered are listed, but only abnormal results are displayed) Labs Reviewed  CBC WITH DIFFERENTIAL/PLATELET - Abnormal; Notable for the following components:      Result Value   WBC 11.8 (*)    Neutro Abs 8.0 (*)    Abs Immature Granulocytes 0.08 (*)    All other components within normal limits  COMPREHENSIVE METABOLIC PANEL WITH GFR - Abnormal; Notable for the following components:   Glucose, Bld 105 (*)    Albumin 3.4 (*)    All other components within normal limits  URINALYSIS, ROUTINE W REFLEX MICROSCOPIC  TROPONIN I (HIGH SENSITIVITY)     EKG:    Date: 12/08/2023 5:15 AM  Rate: 64  Rhythm: normal sinus rhythm  QRS Axis: normal  Intervals: normal  ST/T Wave abnormalities: normal  Conduction Disutrbances: none  Narrative Interpretation: unremarkable      RADIOLOGY: My personal review and interpretation of imaging: CTs, x-rays show no acute traumatic injury.  I have personally reviewed all radiology reports. CT HEAD WO CONTRAST ( ) Result Date: 12/08/2023 CLINICAL DATA:  Mechanical fall at home, minor head and neck trauma. EXAM: CT HEAD WITHOUT CONTRAST CT CERVICAL SPINE WITHOUT CONTRAST TECHNIQUE: Multidetector CT imaging of the head and cervical spine was performed following the standard protocol without intravenous contrast. Multiplanar CT image reconstructions of the cervical spine were also  generated. RADIATION DOSE REDUCTION: This exam was performed according to the departmental dose-optimization program which includes automated exposure control, adjustment of the mA and/or kV according to patient size and/or use of iterative reconstruction technique. COMPARISON:  Head CT and cervical spine CT both 12/10/2021. FINDINGS: CT HEAD FINDINGS Brain: There is mild cerebral atrophy, small-vessel disease, and atrophic ventriculomegaly. No cortical based acute infarct, hemorrhage, mass or mass effect is seen. Cerebellum and brainstem are unremarkable. There is no midline shift. The basal cisterns are clear. Vascular: There are patchy calcifications in the carotid siphons. No hyperdense central vasculature. Skull: Negative for fractures or focal lesions. No visible scalp hematoma. Sinuses/Orbits: Old lens replacements with otherwise negative orbits. Clear sinuses and mastoids with midline nasal septum. Other: None. CT CERVICAL SPINE FINDINGS Alignment: Reversed cervical lordosis is similar to the previous study, without evidence of listhesis. Narrowing and spurring is again noted of the anterior atlantodental joint. Skull base and vertebrae: No acute fracture is evident. No primary bone lesion or focal pathologic process. Soft tissues and spinal canal: No prevertebral fluid or swelling. No visible canal hematoma. Calcific plaques again noted both carotid bifurcations. No laryngeal or thyroid  mass. 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 There are mild degenerative disc changes C6-7 and C7-T1 with normal disc heights above C5. There are small posterior endplate spurs C3-4 but no spondylotic cord compression. Other levels do not show significant soft tissue or bony encroachment on the thecal sac. There are no herniated discs. There is multilevel facet joint hypertrophy, few levels with uncinate spurring, but no levels demonstrate significant foraminal narrowing.  Upper chest: Negative. Other: None. IMPRESSION: 1. No acute intracranial CT findings or depressed skull fractures. 2. Atrophy and small-vessel disease. 3. Reversed cervical lordosis without evidence of cervical fractures or listhesis. 4. 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 5. Carotid atherosclerosis. Electronically Signed   By: Francis Quam M.D.   On: 12/08/2023 05:59   CT Cervical Spine Wo Contrast Result Date: 12/08/2023 CLINICAL DATA:  Mechanical fall at home, minor head and neck trauma. EXAM: CT HEAD WITHOUT CONTRAST CT CERVICAL SPINE WITHOUT CONTRAST TECHNIQUE: Multidetector CT imaging of the head and cervical spine was performed following the standard protocol without intravenous contrast. Multiplanar CT image reconstructions of the cervical spine were also generated. RADIATION DOSE REDUCTION: This exam was performed according to the departmental dose-optimization program which includes automated exposure control, adjustment of the mA and/or kV according to patient size and/or use of iterative reconstruction technique. COMPARISON:  Head CT and cervical spine CT both 12/10/2021. FINDINGS: CT HEAD FINDINGS Brain: There is mild cerebral atrophy, small-vessel disease, and atrophic ventriculomegaly. No cortical based acute infarct, hemorrhage, mass or mass effect is seen. Cerebellum and brainstem are unremarkable. There is no midline shift. The basal cisterns are clear. Vascular: There are patchy calcifications in the carotid siphons. No hyperdense central vasculature. Skull: Negative for fractures or focal lesions. No visible scalp hematoma. Sinuses/Orbits: Old lens replacements with otherwise negative orbits. Clear sinuses and mastoids with midline nasal septum. Other: None. CT CERVICAL SPINE FINDINGS Alignment: Reversed cervical lordosis is similar to the previous study, without evidence of listhesis. Narrowing and spurring is again noted of the anterior atlantodental  joint. Skull base and vertebrae: No acute fracture is evident. No primary bone lesion or focal pathologic process. Soft tissues and spinal canal: No prevertebral fluid or swelling. No visible canal hematoma. Calcific plaques again noted both carotid bifurcations. No laryngeal or thyroid  mass. 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 There are mild degenerative disc changes C6-7 and C7-T1 with normal disc heights above C5. There are small posterior endplate spurs C3-4 but no spondylotic cord compression. Other levels do not show significant soft tissue or bony encroachment on the thecal sac. There are no herniated discs. There is multilevel facet joint hypertrophy, few levels with uncinate spurring, but no levels demonstrate significant foraminal narrowing. Upper chest: Negative. Other: None. IMPRESSION: 1. No acute intracranial CT findings or depressed skull fractures. 2. Atrophy and small-vessel disease. 3. Reversed cervical lordosis without evidence of cervical fractures or listhesis. 4. 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 5. Carotid atherosclerosis. Electronically Signed   By: Francis Quam M.D.   On: 12/08/2023 05:59   DG Hip Unilat W or Wo Pelvis 2-3 Views Right Result Date: 12/08/2023 EXAM: 2 or 3 VIEW(S) XRAY OF THE PELVIS AND RIGHT HIP 12/08/2023 05:44:04 AM COMPARISON: None available. CLINICAL HISTORY: Hip pain. Fall today. Right hip pain. FINDINGS: JOINTS: SI joints are symmetric. No acute fracture. Bilateral hips demonstrate normal alignment. SOFT TISSUES: The soft tissues are unremarkable. IMPRESSION: 1. No acute abnormality of the right hip. Electronically signed by: Lonni Necessary MD 12/08/2023 05:51 AM EDT RP Workstation: [id number]   DG Knee Complete 4 Views Right Result Date: 12/08/2023 CLINICAL DATA:  Skin tear to the right knee after a fall. EXAM: RIGHT KNEE - COMPLETE 4+ VIEW COMPARISON:   02/17/2023 FINDINGS: No evidence of fracture, dislocation, or joint effusion. No evidence of arthropathy or other focal bone abnormality. Soft tissues are unremarkable. IMPRESSION: Negative. Electronically Signed   By: Elsie Gravely M.D.   On: 12/08/2023 03:41   DG Elbow Complete Right Result Date: 12/08/2023 CLINICAL DATA:  Skin tear to the right elbow after a fall. EXAM: RIGHT ELBOW - COMPLETE 3+ VIEW COMPARISON:  None Available. FINDINGS: There is no evidence of fracture, dislocation, or joint effusion. There is no evidence of arthropathy or other focal bone abnormality. Soft tissues are unremarkable.  IMPRESSION: Negative. Electronically Signed   By: Elsie Gravely M.D.   On: 12/08/2023 03:40     PROCEDURES:  Critical Care performed: No     .1-3 Lead EKG Interpretation  Performed by: Sidney Silberman, Josette SAILOR, DO Authorized by: Nikola Marone, Josette SAILOR, DO     Interpretation: normal     ECG rate:  94   ECG rate assessment: normal     Rhythm: sinus rhythm     Ectopy: none     Conduction: normal     LACERATION REPAIR Performed by: Josette Sink Authorized by: Josette Sink Consent: Verbal consent obtained. Risks and benefits: risks, benefits and alternatives were discussed Consent given by: patient Patient identity confirmed: provided demographic data Prepped and Draped in normal sterile fashion Wound explored  Laceration Location: Right knee  Laceration Length: 10 cm  No Foreign Bodies seen or palpated  Anesthesia: local infiltration  Local anesthetic: lidocaine  2% with epinephrine   Anesthetic total: 8 ml  Irrigation method: syringe Amount of cleaning: standard  Skin closure: Superficial  Number of sutures: 22  Technique: Area anesthetized using lidocaine  2% with epinephrine . Wound irrigated copiously with sterile saline. Wound then cleaned with Betadine and draped in sterile fashion. Wound closed using 22 simple interrupted sutures with 4-0 Prolene.  Bacitracin  and sterile  dressing applied. Good wound approximation and hemostasis achieved.    Patient tolerance: Patient tolerated the procedure well with no immediate complications.   IMPRESSION / MDM / ASSESSMENT AND PLAN / ED COURSE  I reviewed the triage vital signs and the nursing notes.  Patient here after a fall with skin tears to the right elbow and knee.  Also complaining of continued chest pain and  has been lightheaded all day today.  The patient is on the cardiac monitor to evaluate for evidence of arrhythmia and/or significant heart rate changes.   DIFFERENTIAL DIAGNOSIS (includes but not limited to):   Mechanical fall, skin tears, contusions, fracture, anemia, electrolyte derangement, dehydration, UTI, less likely ACS, PE, dissection  Patient's presentation is most consistent with acute presentation with potential threat to life or bodily function.  PLAN: X-rays of the right knee, elbow obtained from triage and reviewed/interpreted by myself and the radiologist and are unremarkable.  Skin to the right elbow has completely avulsed.  Will clean this wound and apply a sterile dressing.  Skin tear to the right knee may need Steri-Strips versus sutures.  Will also obtain x-ray of the right hip and CT of the head and cervical spine given age and distracting injuries.  Will provide with pain medication.  She states her tetanus vaccine is up-to-date.  Given continued chest pain and lightheadedness today, will obtain labs, troponin.  EKG nonischemic.  Will give IV fluids.   MEDICATIONS GIVEN IN ED: Medications  bacitracin  ointment (has no administration in time range)  bacitracin  ointment (has no administration in time range)  sodium chloride  0.9 % bolus 1,000 mL (0 mLs Intravenous Stopped 12/08/23 0759)  fentaNYL  (SUBLIMAZE ) injection 50 mcg (50 mcg Intravenous Given 12/08/23 0513)  ondansetron  (ZOFRAN ) injection 4 mg (4 mg Intravenous Given 12/08/23 0513)  lidocaine -EPINEPHrine  (XYLOCAINE  W/EPI) 2  %-1:200000 (PF) injection 20 mL (20 mLs Intradermal Given 12/08/23 0556)  lidocaine -prilocaine  (EMLA ) cream ( Topical Given 12/08/23 0556)  bacitracin  ointment (1 Application Topical Given 12/08/23 0559)     ED COURSE: Patient has a leukocytosis of 11.8.  This appears chronic for her.  She denies any infectious symptoms.  Normal electrolytes, glucose.  Negative troponin.  Given chest  pain is atypical and ongoing for 1 to 2 weeks, no indication for serial enzymes.  Additional imaging shows no acute traumatic injury when reviewed and interpreted by myself and the radiologist.  Elbow wound cleaned and dressed.  Knee wound cleaned and sutured and approximated well.  Will place her in a knee immobilizer to wear for the next 2 weeks when walking so that this knee does not bend and reopen this wound.  No indication to put her on antibiotics prophylactically.  Will have her follow-up with her doctor to have her sutures removed in 2 weeks.  Urine pending.  Signed out the oncoming ED physician to follow-up on urinalysis results.  Patient and family comfortable with this plan.   CONSULTS: Anticipate discharge home once urinalysis results are back.   OUTSIDE RECORDS REVIEWED: Reviewed cardiology note on 11/02/2023.       FINAL CLINICAL IMPRESSION(S) / ED DIAGNOSES   Final diagnoses:  Fall, initial encounter  Dizziness  Multiple skin tears  Atypical chest pain     Rx / DC Orders   ED Discharge Orders     None        Note:  This document was prepared using Dragon voice recognition software and may include unintentional dictation errors.   Yedidya Duddy, Josette SAILOR, DO 12/08/23 [phone]

## 2023-12-08 NOTE — Discharge Instructions (Addendum)
 Please keep your knee immobilizer on when walking for the next 2 weeks.  You may clean your wound gently daily with soap and warm water and then pat dry.  Your sutures are not absorbable and will need to be removed in 2 weeks.  This can be done by your PCP, urgent care or here in the emergency department.  You have a total of 22 sutures.  Please keep your wound clean and dry.  I recommend covering your wounds with a nonadherent dressing daily.  All wounds have the risk of becoming infected.  We do everything sterilely to try to prevent this and clean the wound with saline, Betadine or chlorhexidine.  If your wound becomes red, warm, draining pus, has a foul odor or you have a fever of 100.4 or higher, please return to the emergency department or urgent care.  All wounds also have the risk of scarring.  I recommend once your wound has completely healed and your sutures have come out or have been removed that you make sure to keep sunscreen on your scars every day for at least the next year.  You may also use over-the-counter Mederma once the wound is healed which can help with scar formation.

## 2023-12-09 LAB — URINE CULTURE

## 2024-01-02 ENCOUNTER — Ambulatory Visit

## 2024-02-28 ENCOUNTER — Ambulatory Visit
Admission: RE | Admit: 2024-02-28 | Discharge: 2024-02-28 | Disposition: A | Discharge status: Home / Self Care | Source: Ambulatory Visit | Attending: Pulmonary Disease | Admitting: Pulmonary Disease

## 2024-02-28 DIAGNOSIS — G4733 Obstructive sleep apnea (adult) (pediatric): Secondary | ICD-10-CM | POA: Diagnosis present

## 2024-02-28 DIAGNOSIS — F17210 Nicotine dependence, cigarettes, uncomplicated: Secondary | ICD-10-CM | POA: Insufficient documentation

## 2024-02-28 DIAGNOSIS — J449 Chronic obstructive pulmonary disease, unspecified: Secondary | ICD-10-CM | POA: Insufficient documentation

## 2024-05-02 NOTE — Progress Notes (Signed)
 Chief Complaint:   Chief Complaint  Patient presents with  . same day     Subjective:   Katherine Lester is a 68 y.o. female in today with complaints of sinus congestion and dysuria.  Current Outpatient Medications  Medication Sig Dispense Refill  . albuterol MDI, PROVENTIL, VENTOLIN, PROAIR, HFA 90 mcg/actuation inhaler INHALE 2 INHALATIONS INTO THE LUNGS EVERY 6 HOURS AS NEEDED FOR WHEEZING 18 g 0  . azelastine (ASTELIN) 137 mcg nasal spray Place 1 spray into both nostrils 2 (two) times daily 30 mL 2  . busPIRone (BUSPAR) 15 MG tablet Take 30 mg by mouth 2 (two) times daily    . cyanocobalamin (VITAMIN B12) 1000 MCG tablet Take 1,000 mcg by mouth once daily    . DULoxetine  (CYMBALTA ) 30 MG DR capsule Take 30 mg by mouth every evening    . DULoxetine  (CYMBALTA ) 60 MG DR capsule Take 60 mg by mouth every morning       . ergocalciferol, vitamin D2, 1,250 mcg (50,000 unit) capsule TAKE 1 CAPSULE BY MOUTH EVERY 7 DAYS 4 capsule 2  . hydroCHLOROthiazide (HYDRODIURIL) 12.5 MG tablet Take 1 tablet (12.5 mg total) by mouth once daily 30 tablet 11  . HYDROcodone -acetaminophen  (NORCO) 5-325 mg tablet 1/2-1 tab po bid prn 50 tablet 0  . HYDROcodone -acetaminophen  (NORCO) 5-325 mg tablet 1/2-1 tab po bid prn 50 tablet 0  . HYDROcodone -homatropine (HYCODAN) 5-1.5 mg/5 mL syrup Take 5 mLs by mouth every 6 (six) hours as needed for Cough 120 mL 0  . levoFLOXacin (LEVAQUIN) 500 MG tablet Take 1 tablet (500 mg total) by mouth once daily for 10 days 10 tablet 0  . LORazepam (ATIVAN) 1 MG tablet Take 1 mg by mouth once daily    . metoprolol SUCCinate (TOPROL-XL) 25 MG XL tablet TAKE 1 TABLET(25 MG) BY MOUTH DAILY 90 tablet 1  . multivitamin tablet Take 1 tablet by mouth once daily    . pantoprazole (PROTONIX) 40 MG DR tablet TAKE 1 TABLET(40 MG) BY MOUTH EVERY MORNING (Patient not taking: Reported on 04/03/2024) 90 tablet 1  . pantoprazole (PROTONIX) 40 MG DR tablet Take 1 tablet (40 mg total) by mouth  every morning 90 tablet 1  . polyethylene glycol (MIRALAX) packet Take 17 g by mouth once daily Mix in 4-8ounces of fluid prior to taking.    . rosuvastatin (CRESTOR) 40 MG tablet Take 1 tablet (40 mg total) by mouth once daily (Patient not taking: Reported on 04/03/2024) 30 tablet 11  . tiZANidine (ZANAFLEX) 4 MG tablet TAKE 1/2 TO 1 TABLET(2 TO 4 MG) BY MOUTH TWICE DAILY AS NEEDED 60 tablet 2  . zolpidem (AMBIEN) 10 mg tablet Take 10 mg by mouth at bedtime     No current facility-administered medications for this visit.    Allergies as of 05/02/2024 - Reviewed 05/02/2024  Allergen Reaction Noted  . Amoxicillin-pot clavulanate Other (See Comments) 08/15/2017  . Diltiazem Headache 04/25/2023  . Sudafed [pseudoephedrine hcl] Other (See Comments) 06/04/2016  . Celexa [citalopram] Other (See Comments) 06/04/2016  . Erythromycin base Other (See Comments) 03/19/2016  . Gabapentin  Other (See Comments) 06/28/2016  . Macrobid [nitrofurantoin monohyd/m-cryst] Diarrhea 01/05/2024  . Rosuvastatin Other (See Comments) 04/04/2023  . Codeine Nausea And Vomiting 09/12/2014  . Morphine Nausea And Vomiting 09/22/2016  . Tramadol Other (See Comments) and Anxiety 06/04/2016    Past Medical History:  Diagnosis Date  . Anesthesia complication    dry mouth  . Chronic obstructive pulmonary disease (CMS/HHS-HCC) 04/12/2023  .  DDD (degenerative disc disease), lumbar   . GERD (gastroesophageal reflux disease)   . History of cancer 2021   Skin  . History of cataract   . History of pneumonia    Dr Rudolpho  . Lumbar facet joint syndrome   . Motion sickness   . PONV (postoperative nausea and vomiting)   . Sacroiliac joint dysfunction   . Sleep apnea     Past Surgical History:  Procedure Laterality Date  . EXTRACTION CATARACT EXTRACAPSULAR W/INSERTION INTRAOCULAR PROSTHESIS Right 02/27/2020   Procedure: RIGHT EXTRACTION CATARACT EXTRACAPSULAR WITH PHACO WITH INSERTION INTRAOCULAR PROSTHESIS;  Surgeon:  Thurman Therisa Cohn, MD;  Location: [facility];  Service: Ophthalmology;  Laterality: Right;  . EXTRACTION CATARACT EXTRACAPSULAR W/INSERTION INTRAOCULAR PROSTHESIS Left 03/19/2020   Procedure: LEFT EXTRACTION CATARACT EXTRACAPSULAR WITH PHACO WITH INSERTION INTRAOCULAR PROSTHESIS;  Surgeon: Thurman Therisa Cohn, MD;  Location: [facility];  Service: Ophthalmology;  Laterality: Left;  . APPENDECTOMY    . COLONOSCOPY    . EGD    . HYSTERECTOMY    . Left Arm Fracture Repair and Nerve Repair 1978  1978 and 1979     Family History  Problem Relation Name Age of Onset  . Lung cancer Mother MOM   . Cancer Mother MOM   . Glaucoma Neg Hx    . Macular degeneration Neg Hx    . Anesthesia problems Neg Hx      Social History:  reports that she has been smoking cigarettes. She has a 20 pack-year smoking history. She has been exposed to tobacco smoke. She has never used smokeless tobacco. She reports that she does not drink alcohol and does not use drugs.  Results for orders placed or performed during the hospital encounter of 03/14/24  Request for image library services   Narrative   Please refer to the appropriate PACS to view images.       ROS:  General: No fever, chills or recent illness. No change in weight Skin:   No skin lesions, growths, masses, rashes, pruritus  HEENT: Positive for sinus congestion  respiratory: No cough or shortness of breath CV:  No chest pain or palpitations GI:  No pain, dyspepsia or change in bowel habits GU:  Positive for dysuria Objective:   Body mass index is 33.78 kg/m.  BP 118/80   Pulse 93   Ht 165.1 cm (5' 5)   Wt 92.1 kg (203 lb)   LMP  (LMP Unknown)   SpO2 98%   BMI 33.78 kg/m   General: WD/WN female, in no acute distress HEENT: Pupils equal and round, EOMI. oral mucosa moist.  Oropharynx clear. Neck: supple, trachea midline; no thyromegaly Respiratory:clear to auscultation.  No dullness to percussion.  No use of accessory  muscles. Cardiac:  Regular rate and rhythm without murmur, gallops, or rubs    Assessment/Plan:   Acute non-recurrent sinusitis, unspecified location  (primary encounter diagnosis) Dysuria  Assessment and Plan  1.  Sinusitis.  She has many allergies.  Prescribed Levaquin which she has tolerated in the past.  Continue antihistamines over-the-counter. 2.  Dysuria.  Will get UA and culture.  Levaquin should also cover this.    Goals     . Maintain health/healthy lifestyle        NORLEEN ALM RUDOLPHO, MD  Portions of this note were created using dictation software and may contain typographical errors.

## 2024-05-03 ENCOUNTER — Emergency Department
Admission: EM | Admit: 2024-05-03 | Discharge: 2024-05-04 | Disposition: A | Attending: Emergency Medicine | Admitting: Emergency Medicine

## 2024-05-03 ENCOUNTER — Encounter: Payer: Self-pay | Admitting: Emergency Medicine

## 2024-05-03 ENCOUNTER — Emergency Department

## 2024-05-03 DIAGNOSIS — J449 Chronic obstructive pulmonary disease, unspecified: Secondary | ICD-10-CM | POA: Insufficient documentation

## 2024-05-03 DIAGNOSIS — Z79899 Other long term (current) drug therapy: Secondary | ICD-10-CM | POA: Insufficient documentation

## 2024-05-03 DIAGNOSIS — K8020 Calculus of gallbladder without cholecystitis without obstruction: Secondary | ICD-10-CM | POA: Insufficient documentation

## 2024-05-03 DIAGNOSIS — Z85828 Personal history of other malignant neoplasm of skin: Secondary | ICD-10-CM | POA: Insufficient documentation

## 2024-05-03 DIAGNOSIS — I10 Essential (primary) hypertension: Secondary | ICD-10-CM | POA: Insufficient documentation

## 2024-05-03 LAB — COMPREHENSIVE METABOLIC PANEL WITH GFR
ALT: 19 U/L (ref 0–44)
AST: 21 U/L (ref 15–41)
Albumin: 4.2 g/dL (ref 3.5–5.0)
Alkaline Phosphatase: 128 U/L — ABNORMAL HIGH (ref 38–126)
Anion gap: 13 (ref 5–15)
BUN: 12 mg/dL (ref 8–23)
CO2: 26 mmol/L (ref 22–32)
Calcium: 10.1 mg/dL (ref 8.9–10.3)
Chloride: 102 mmol/L (ref 98–111)
Creatinine, Ser: 0.97 mg/dL (ref 0.44–1.00)
GFR, Estimated: >60 mL/min (ref >60)
Glucose, Bld: 123 mg/dL — ABNORMAL HIGH (ref 70–99)
Potassium: 4.3 mmol/L (ref 3.5–5.1)
Sodium: 140 mmol/L (ref 135–145)
Total Bilirubin: 0.3 mg/dL (ref 0.0–1.2)
Total Protein: 7.6 g/dL (ref 6.5–8.1)

## 2024-05-03 LAB — CBC
HCT: 44.3 % (ref 36.0–46.0)
Hemoglobin: 14.9 g/dL (ref 12.0–15.0)
MCH: 31.3 pg (ref 26.0–34.0)
MCHC: 33.6 g/dL (ref 30.0–36.0)
MCV: 93.1 fL (ref 80.0–100.0)
Platelets: 395 K/uL (ref 150–400)
RBC: 4.76 MIL/uL (ref 3.87–5.11)
RDW: 15.5 % (ref 11.5–15.5)
WBC: 15.4 K/uL — ABNORMAL HIGH (ref 4.0–10.5)
nRBC: 0 % (ref 0.0–0.2)

## 2024-05-03 LAB — LIPASE, BLOOD: Lipase: 34 U/L (ref 11–51)

## 2024-05-03 MED ORDER — OXYCODONE-ACETAMINOPHEN 5-325 MG PO TABS: 1.0000 | ORAL_TABLET | ORAL | Status: DC | PRN

## 2024-05-03 MED ORDER — ONDANSETRON 4 MG PO TBDP
4.0000 mg | ORAL_TABLET | Freq: Once | ORAL | Status: AC
  Administered 2024-05-03: 4 mg via ORAL
  Filled 2024-05-03: qty 1

## 2024-05-03 NOTE — ED Triage Notes (Addendum)
 Pt arrives POV ambulatory to triage, gait steady, no acute distress noted c/o RUQ pain that radiates to back w/ nausea and sob that started 2hrs pta. Pt took oxy 325-5 mg 1.5 hr pta, no relief.

## 2024-05-03 NOTE — ED Notes (Signed)
 Pt reports having urine tested at doctors office yesterday, see lab results

## 2024-05-04 ENCOUNTER — Other Ambulatory Visit: Payer: Self-pay

## 2024-05-04 ENCOUNTER — Emergency Department

## 2024-05-04 DIAGNOSIS — K8020 Calculus of gallbladder without cholecystitis without obstruction: Secondary | ICD-10-CM | POA: Diagnosis not present

## 2024-05-04 LAB — TROPONIN T, HIGH SENSITIVITY: Troponin T High Sensitivity: <15 ng/L (ref 0–19)

## 2024-05-04 MED ORDER — ONDANSETRON 4 MG PO TBDP
4.0000 mg | ORAL_TABLET | Freq: Once | ORAL | Status: DC
  Filled 2024-05-04: qty 1

## 2024-05-04 MED ORDER — ONDANSETRON 4 MG PO TBDP: 4.0000 mg | ORAL_TABLET | Freq: Four times a day (QID) | ORAL | 0 refills | Status: AC | PRN

## 2024-05-04 MED ORDER — DICYCLOMINE HCL 20 MG PO TABS: 20.0000 mg | ORAL_TABLET | Freq: Three times a day (TID) | ORAL | 0 refills | Status: AC | PRN

## 2024-05-04 NOTE — Discharge Instructions (Addendum)
 You may take your pain medication at home as previously prescribed.  We have provided you with additional pain and nausea medicine that you can take as needed.  I recommend a low-fat diet to prevent gallbladder attacks.  Please follow-up with general surgery to discuss removal of your gallbladder.  If you have worsening pain that is uncontrolled with medications at home, fever of 100.4 or higher, vomiting that does not stop, yellowing of your skin or eyes, please return to the emergency department.

## 2024-05-04 NOTE — ED Provider Notes (Signed)
 [facility] Provider Note    Event Date/Time   First MD Initiated Contact with Patient 05/03/24 2352     (approximate)   History   Abdominal Pain   HPI  Alegria Dominique is a 68 y.o. female with history of COPD, depression, hypertension, IBS who presents to the emergency department with right upper quadrant abdominal pain.  History of previous appendectomy, hysterectomy.  Has had nausea.  No vomiting.  No diarrhea.  Pain radiated into the right side of her chest.  Has had pleurisy before.  No history of PE or DVT.  No prior history of gallstones.  States she ate Mexican for lunch today.  States she took one of her pain pills at home as well as Mylanta.  She reports pain is improved but still feels sore.  Seen on 05/02/2024 with complaints of sinus congestion and dysuria and was started on Levaquin 500 mg once daily for 10 days for sinusitis and to potentially cover for UTI although urine was relatively unremarkable.   History provided by patient.    Past Medical History:  Diagnosis Date   Allergy    Anxiety    Cancer (HCC) 01/2020   basal cell   Chronic back pain    Depression    Generalized edema    GERD (gastroesophageal reflux disease)    Hypertension    IBS (irritable bowel syndrome)    Insomnia    Irritable bowel syndrome    Positional vertigo    Sinusitis    Spastic colon     Past Surgical History:  Procedure Laterality Date   ABDOMINAL HYSTERECTOMY     APPENDECTOMY     BREAST BIOPSY Right    stereo bx-neg   DILATION AND CURETTAGE OF UTERUS     FRACTURE SURGERY Left    arm    MEDICATIONS:  Prior to Admission medications   Medication Sig Start Date End Date Taking? Authorizing Provider  BREZTRI AEROSPHERE 160-9-4.8 MCG/ACT AERO Inhale into the lungs.    [provider]  busPIRone (BUSPAR) 10 MG tablet Take 10 mg by mouth 3 (three) times daily.    [provider]  cetirizine (ZYRTEC) 10 MG tablet Take 10 mg by  mouth.    [provider]  Cholecalciferol (VITAMIN D3) 10000 UNITS capsule Take 50,000 Units by mouth once a week.     [provider]  cyclobenzaprine  (FLEXERIL ) 10 MG tablet Limit 1 tab by mouth 2-4 times a day if tolerated Patient not taking: Reported on 07/13/2018 02/09/16   Dannial Hacker, MD  diltiazem (CARDIZEM CD) 120 MG 24 hr capsule Take 120 mg by mouth daily. 04/04/23   [provider]  DULoxetine  (CYMBALTA ) 20 MG capsule Limit 1 capsule by mouth per day if tolerated Patient not taking: Reported on 01/16/2018 02/09/16   Dannial Hacker, MD  DULoxetine  (CYMBALTA ) 60 MG capsule Take 60 mg by mouth daily.  10/26/17   [provider]  levofloxacin (LEVAQUIN) 500 MG tablet Take 1 tablet by mouth daily. 03/01/17   [provider]  LORazepam (ATIVAN) 0.5 MG tablet  06/23/18   [provider]  ondansetron  (ZOFRAN -ODT) 4 MG disintegrating tablet Take 4 mg by mouth every 8 (eight) hours as needed for nausea or vomiting.    [provider]  pantoprazole (PROTONIX) 40 MG tablet Take 40 mg by mouth daily.  09/29/16 09/29/17  [provider]  pantoprazole (PROTONIX) 40 MG tablet  10/27/17   [provider]  polyethylene glycol (MIRALAX / GLYCOLAX) packet  09/06/16   [provider]  prochlorperazine  (COMPAZINE ) 10 MG tablet Take 1 tablet (10 mg total) by mouth every 8 (eight) hours as needed (headache). 12/10/21   Goodman, Graydon, MD  Vitamin D, Ergocalciferol, (DRISDOL) 50000 units CAPS capsule  11/28/17   [provider]  zolpidem (AMBIEN) 5 MG tablet Take 5 mg by mouth at bedtime.    [provider]    Physical Exam   Triage Vital Signs: ED Triage Vitals [05/03/24 2124]  Encounter Vitals Group     BP (!) 151/101     Girls Systolic BP Percentile      Girls Diastolic BP Percentile      Boys Systolic BP Percentile      Boys Diastolic BP Percentile      Pulse Rate 91     Resp (!) 22     Temp 97.7 F  (36.5 C)     Temp Source Oral     SpO2 96 %     Weight      Height      Head Circumference      Peak Flow      Pain Score      Pain Loc      Pain Education      Exclude from Growth Chart     Most recent vital signs: Vitals:   05/04/24 0018 05/04/24 0020  BP: 130/68   Pulse:  74  Resp:    Temp:    SpO2:  100%    CONSTITUTIONAL: Alert, responds appropriately to questions. Well-appearing; well-nourished HEAD: Normocephalic, atraumatic EYES: Conjunctivae clear, pupils appear equal, sclera nonicteric ENT: normal nose; moist mucous membranes NECK: Supple, normal ROM CARD: RRR; S1 and S2 appreciated RESP: Normal chest excursion without splinting or tachypnea; breath sounds clear and equal bilaterally; no wheezes, no rhonchi, no rales, no hypoxia or respiratory distress, speaking full sentences ABD/GI: Non-distended; soft, tender in the right upper quadrant without guarding or rebound BACK: The back appears normal EXT: Normal ROM in all joints; no deformity noted, no edema SKIN: Normal color for age and race; warm; no rash on exposed skin NEURO: Moves all extremities equally, normal speech PSYCH: The patient's mood and manner are appropriate.   ED Results / Procedures / Treatments   LABS: (all labs ordered are listed, but only abnormal results are displayed) Labs Reviewed  COMPREHENSIVE METABOLIC PANEL WITH GFR - Abnormal; Notable for the following components:      Result Value   Glucose, Bld 123 (*)    Alkaline Phosphatase 128 (*)    All other components within normal limits  CBC - Abnormal; Notable for the following components:   WBC 15.4 (*)    All other components within normal limits  LIPASE, BLOOD  TROPONIN T, HIGH SENSITIVITY     EKG:  EKG Interpretation Date/Time:  Thursday May 03 2024 21:20:25 EST Ventricular Rate:  80 PR Interval:  136 QRS Duration:  86 QT Interval:  346 QTC Calculation: 399 R Axis:   39  Text Interpretation: Normal sinus  rhythm ST & T wave abnormality, consider lateral ischemia Abnormal ECG When compared with ECG of 08-Dec-2023 05:15, PREVIOUS ECG IS PRESENT Confirmed by Neomi Neptune [phone]) on 05/03/2024 11:59:26 PM         RADIOLOGY: My personal review and interpretation of imaging: Chest x-ray clear.  Ultrasound shows gallstones but no cholecystitis.  I have personally reviewed all radiology reports.   DG Chest  Portable 1 View Result Date: 05/04/2024 EXAM: 1 VIEW(S) XRAY OF THE CHEST 05/04/2024 12:23:41 AM COMPARISON: 11/20/2023 CLINICAL HISTORY: SOB FINDINGS: LUNGS AND PLEURA: Left mid lung zone linear atelectasis/scarring. No pleural effusion. No pneumothorax. HEART AND MEDIASTINUM: Atherosclerotic plaque noted. No acute abnormality of the cardiac and mediastinal silhouettes. BONES AND SOFT TISSUES: No acute osseous abnormality. IMPRESSION: 1. Left mid lung zone linear atelectasis/scarring. Electronically signed by: Dorethia Molt MD 05/04/2024 12:29 AM EST RP Workstation: [id number]   US  ABDOMEN LIMITED RUQ (LIVER/GB) Result Date: 05/04/2024 EXAM: Right Upper Quadrant Abdominal Ultrasound TECHNIQUE: Real-time ultrasonography of the right upper quadrant of the abdomen was performed. COMPARISON: None available. CLINICAL HISTORY: RUQ abdominal pain. FINDINGS: LIVER: Hepatic parenchymal echogenicity is diffusely increased, and there is poor acoustic through transmission in keeping with hepatic steatosis. No focal intrahepatic masses are seen. No intrahepatic biliary ductal dilatation. BILIARY SYSTEM: Multiple gallstones are seen within the gallbladder. The gallbladder is not distended. No evidence of pericholecystic fluid or wall thickening. The sonographic Beverley sign is reportedly negative. Common bile duct measures 5 mm, proximal diameter. RIGHT KIDNEY: The right kidney is grossly unremarkable in appearances without evidence of hydronephrosis, echogenic calculi or worrisome mass lesions. PANCREAS: Visualized  portions of the pancreas are unremarkable. OTHER: No right upper quadrant ascites. IMPRESSION: 1. Cholelithiasis without sonographic evidence of acute cholecystitis. 2. Hepatic steatosis Electronically signed by: Dorethia Molt MD 05/04/2024 12:29 AM EST RP Workstation: [id number]     PROCEDURES:  Critical Care performed: No      .1-3 Lead EKG Interpretation  Performed by: Dameon Soltis, Josette SAILOR, DO Authorized by: Capucine Tryon, Josette SAILOR, DO     Interpretation: normal     ECG rate:  74   ECG rate assessment: normal     Rhythm: sinus rhythm     Ectopy: none     Conduction: normal       IMPRESSION / MDM / ASSESSMENT AND PLAN / ED COURSE  I reviewed the triage vital signs and the nursing notes.    Patient here with complaints of abdominal pain that radiates into her chest.  The patient is on the cardiac monitor to evaluate for evidence of arrhythmia and/or significant heart rate changes.   DIFFERENTIAL DIAGNOSIS (includes but not limited to):   Gallstones, cholecystitis, pancreatitis, GERD, gastritis, peptic ulcer disease, H. pylori, pneumonia, PE, less likely ACS   Patient's presentation is most consistent with acute presentation with potential threat to life or bodily function.   PLAN: Will obtain labs, chest x-ray, right upper quad abdominal ultrasound.  Will give nausea medicine.  She declines anything for pain.   MEDICATIONS GIVEN IN ED: Medications  ondansetron  (ZOFRAN -ODT) disintegrating tablet 4 mg (has no administration in time range)  ondansetron  (ZOFRAN -ODT) disintegrating tablet 4 mg (4 mg Oral Given 05/03/24 2127)     ED COURSE: Labs show negative troponin.  Normal LFTs and lipase.  Chronically elevated white blood cell count which appears stable.  Chest x-ray, right upper quadrant ultrasound reviewed and interpreted by myself and the radiologist and shows no pneumonia, edema but does show gallstones without cholecystitis.  Suspect that symptomatic cholelithiasis is the  cause of her symptoms today.  I feel she is safe for discharge home with close outpatient follow-up with general surgery, low-fat diet, continue her narcotic pain medication at home as prescribed.  Will also discharge with Bentyl , Zofran .  Discussed return precautions.  Patient comfortable with this plan.  At this time, I do not feel there is any life-threatening condition present.  I reviewed all nursing notes, vitals, pertinent previous records.  All lab and urine results, EKGs, imaging ordered have been independently reviewed and interpreted by myself.  I reviewed all available radiology reports from any imaging ordered this visit.  Based on my assessment, I feel the patient is safe to be discharged home without further emergent workup and can continue workup as an outpatient as needed. Discussed all findings, treatment plan as well as usual and customary return precautions.  They verbalize understanding and are comfortable with this plan.  Outpatient follow-up has been provided as needed.  All questions have been answered.    CONSULTS:  none   OUTSIDE RECORDS REVIEWED: Reviewed internal medicine note on 05/02/2024.       FINAL CLINICAL IMPRESSION(S) / ED DIAGNOSES   Final diagnoses:  Gallstones     Rx / DC Orders   ED Discharge Orders          Ordered    ondansetron  (ZOFRAN -ODT) 4 MG disintegrating tablet  Every 6 hours PRN        05/04/24 0105    dicyclomine  (BENTYL ) 20 MG tablet  Every 8 hours PRN        05/04/24 0105             Note:  This document was prepared using Dragon voice recognition software and may include unintentional dictation errors.   Coran Dipaola, Josette SAILOR, DO 05/04/24 [phone]

## 2024-05-07 ENCOUNTER — Ambulatory Visit: Payer: Self-pay | Admitting: General Surgery

## 2024-05-08 NOTE — H&P (View-Only) (Signed)
 History of Present Illness Katherine Lester is a 68 year old female who presents with right upper quadrant abdominal pain.  She presented to the ER on May 03, 2034, with severe right upper quadrant abdominal pain radiating to her back. This was her first episode of such pain, described as 'hurting like mad'. The pain worsened when lying down and was severe enough to cause her to cry.  She has a history of acid reflux and gastric ulcers, for which she takes pantoprazole daily. During the episode, she took Pepcid AC and a pain pill, but these did not alleviate the pain. An ultrasound performed on the same day showed multiple gallstones in the gallbladder. The gallbladder was not distended, and there was no pericardial cystic fluid or wall thickening. The common bile duct measured five millimeters. A comprehensive metabolic panel showed a total bilirubin of 0.3.  She has a history of IBS, which has worsened recently, requiring increased use of Miralax. She also has a history of gastric ulcers, which were treated successfully with antibiotics in the past.      PAST MEDICAL HISTORY:  Past Medical History:  Diagnosis Date   Allergy 1999   Anesthesia complication    dry mouth   Anxiety 2018   Arrhythmia 09/24   Arthritis 2017   Chronic obstructive pulmonary disease (CMS/HHS-HCC) 04/12/2023   DDD (degenerative disc disease), lumbar    Depression 2018   GERD (gastroesophageal reflux disease)    History of cancer 2021   Skin   History of cataract    History of pneumonia    Dr Rudolpho   Hyperlipidemia    Hypertension 2024   Lumbar facet joint syndrome    Motion sickness    Obesity    PONV (postoperative nausea and vomiting)    Sacroiliac joint dysfunction    Sinusitis, unspecified    Sleep apnea         PAST SURGICAL HISTORY:   Past Surgical History:  Procedure Laterality Date   EXTRACTION CATARACT EXTRACAPSULAR W/INSERTION INTRAOCULAR PROSTHESIS Right  02/27/2020   Procedure: RIGHT EXTRACTION CATARACT EXTRACAPSULAR WITH PHACO WITH INSERTION INTRAOCULAR PROSTHESIS;  Surgeon: Thurman Therisa Cohn, MD;  Location: [facility];  Service: Ophthalmology;  Laterality: Right;   EXTRACTION CATARACT EXTRACAPSULAR W/INSERTION INTRAOCULAR PROSTHESIS Left 03/19/2020   Procedure: LEFT EXTRACTION CATARACT EXTRACAPSULAR WITH PHACO WITH INSERTION INTRAOCULAR PROSTHESIS;  Surgeon: Thurman Therisa Cohn, MD;  Location: [facility];  Service: Ophthalmology;  Laterality: Left;   APPENDECTOMY     CATARACT EXTRACTION  2021   COLONOSCOPY     EGD     FRACTURE SURGERY  1978   Left arm 2 surgeries   HYSTERECTOMY     Left Arm Fracture Repair and Nerve Repair 1978  1978 and 1979         MEDICATIONS:  Outpatient Encounter Medications as of 05/08/2024  Medication Sig Dispense Refill   azelastine (ASTELIN) 137 mcg nasal spray Place 1 spray into both nostrils 2 (two) times daily 30 mL 2   cyanocobalamin (VITAMIN B12) 1000 MCG tablet Take 1,000 mcg by mouth once daily     DULoxetine  (CYMBALTA ) 30 MG DR capsule Take 30 mg by mouth every evening     DULoxetine  (CYMBALTA ) 60 MG DR capsule Take 60 mg by mouth every morning        ergocalciferol, vitamin D2, 1,250 mcg (50,000 unit) capsule TAKE 1 CAPSULE BY MOUTH EVERY 7 DAYS 4 capsule 2   HYDROcodone -acetaminophen  (NORCO) 5-325 mg tablet 1/2-1 tab po bid  prn 50 tablet 0   levoFLOXacin (LEVAQUIN) 500 MG tablet Take 1 tablet (500 mg total) by mouth once daily for 10 days 10 tablet 0   LORazepam (ATIVAN) 1 MG tablet Take 1 mg by mouth once daily     metoprolol SUCCinate (TOPROL-XL) 25 MG XL tablet TAKE 1 TABLET(25 MG) BY MOUTH DAILY 90 tablet 1   multivitamin tablet Take 1 tablet by mouth once daily     OHTUVAYRE  3 mg/2.5 mL NbSp Inhale 2.5 mLs into the lungs 2 (two) times daily     ondansetron  (ZOFRAN -ODT) 4 MG disintegrating tablet Take 4 mg by mouth every 6 (six) hours as needed for Nausea or Vomiting      pantoprazole (PROTONIX) 40 MG DR tablet Take 1 tablet (40 mg total) by mouth every morning 90 tablet 1   polyethylene glycol (MIRALAX) packet Take 17 g by mouth once daily Mix in 4-8ounces of fluid prior to taking.     tiZANidine (ZANAFLEX) 4 MG tablet TAKE 1/2 TO 1 TABLET(2 TO 4 MG) BY MOUTH TWICE DAILY AS NEEDED 60 tablet 2   zolpidem (AMBIEN) 10 mg tablet Take 10 mg by mouth at bedtime     albuterol MDI, PROVENTIL, VENTOLIN, PROAIR, HFA 90 mcg/actuation inhaler INHALE 2 INHALATIONS INTO THE LUNGS EVERY 6 HOURS AS NEEDED FOR WHEEZING (Patient not taking: Reported on 05/08/2024) 18 g 0   busPIRone (BUSPAR) 15 MG tablet Take 30 mg by mouth 2 (two) times daily (Patient not taking: Reported on 05/08/2024)     hydroCHLOROthiazide (HYDRODIURIL) 12.5 MG tablet Take 1 tablet (12.5 mg total) by mouth once daily 30 tablet 11   HYDROcodone -acetaminophen  (NORCO) 5-325 mg tablet 1/2-1 tab po bid prn (Patient not taking: Reported on 05/08/2024) 50 tablet 0   HYDROcodone -homatropine (HYCODAN) 5-1.5 mg/5 mL syrup Take 5 mLs by mouth every 6 (six) hours as needed for Cough (Patient not taking: Reported on 05/08/2024) 120 mL 0   pantoprazole (PROTONIX) 40 MG DR tablet TAKE 1 TABLET(40 MG) BY MOUTH EVERY MORNING (Patient not taking: Reported on 03/19/2024) 90 tablet 1   rosuvastatin (CRESTOR) 40 MG tablet Take 1 tablet (40 mg total) by mouth once daily (Patient not taking: Reported on 04/03/2024) 30 tablet 11   No facility-administered encounter medications on file as of 05/08/2024.     ALLERGIES:   Amoxicillin-pot clavulanate, Diltiazem, Sudafed [pseudoephedrine hcl], Celexa [citalopram], Erythromycin base, Gabapentin , Macrobid [nitrofurantoin monohyd/m-cryst], Rosuvastatin, Codeine, Morphine, and Tramadol   SOCIAL HISTORY:  Social History   Socioeconomic History   Marital status: Married  Tobacco Use   Smoking status: Every Day    Current packs/day: 0.50    Average packs/day: 0.5 packs/day for  40.0 years (20.0 ttl pk-yrs)    Types: Cigarettes    Passive exposure: Current   Smokeless tobacco: Never  Vaping Use   Vaping status: Never Used  Substance and Sexual Activity   Alcohol use: No   Drug use: Never   Sexual activity: Not Currently    Birth control/protection: None   Social Drivers of Health   Financial Resource Strain: Low Risk  (05/08/2024)   Overall Financial Resource Strain (CARDIA)    Difficulty of Paying Living Expenses: Not hard at all  Food Insecurity: No Food Insecurity (05/08/2024)   Hunger Vital Sign    Worried About Running Out of Food in the Last Year: Never true    Ran Out of Food in the Last Year: Never true  Transportation Needs: No Transportation Needs (05/08/2024)  PRAPARE - Administrator, Civil Service (Medical): No    Lack of Transportation (Non-Medical): No    FAMILY HISTORY:  Family History  Problem Relation Name Age of Onset   Lung cancer Mother MOM    Cancer Mother MOM    Skin cancer Father Pop    Alzheimer's disease Paternal Grandmother Brownie    Osteoporosis (Thinning of bones) Paternal Aunt Nena    Osteoporosis (Thinning of bones) Paternal Aunt Judy    Osteoporosis (Thinning of bones) Paternal Aunt Nena    Osteoporosis (Thinning of bones) Paternal Aunt Judy    Glaucoma Neg Hx     Macular degeneration Neg Hx     Anesthesia problems Neg Hx       GENERAL REVIEW OF SYSTEMS:   General ROS: negative for - chills, fatigue, fever, weight gain or weight loss Allergy and Immunology ROS: negative for - hives  Hematological and Lymphatic ROS: negative for - bleeding problems or bruising, negative for palpable nodes Endocrine ROS: negative for - heat or cold intolerance, hair changes Respiratory ROS: negative for - cough, shortness of breath or wheezing Cardiovascular ROS: no chest pain or palpitations GI ROS: negative for nausea, vomiting, abdominal pain, diarrhea, constipation Musculoskeletal ROS:  negative for - joint swelling or muscle pain Neurological ROS: negative for - confusion, syncope Dermatological ROS: negative for pruritus and rash  PHYSICAL EXAM:  Vitals:   05/08/24 1116  BP: 127/84  Pulse: 84  .  Ht:165.1 cm (5' 5) Wt:92.1 kg (203 lb) ADJ:Anib surface area is 2.06 meters squared. Body mass index is 33.78 kg/m.SABRA   GENERAL: Alert, active, oriented x3  HEENT: Pupils equal reactive to light. Extraocular movements are intact. Sclera clear. Palpebral conjunctiva normal red color.Pharynx clear.  NECK: Supple with no palpable mass and no adenopathy.  LUNGS: Sound clear with no rales rhonchi or wheezes.  HEART: Regular rhythm S1 and S2 without murmur.  ABDOMEN: Soft and depressible, nontender with no palpable mass, no hepatomegaly.   EXTREMITIES: Well-developed well-nourished symmetrical with no dependent edema.  NEUROLOGICAL: Awake alert oriented, facial expression symmetrical, moving all extremities.   Results LABS CMP: Total bilirubin 0.3 mg/dL (87/95/7974)  RADIOLOGY Abdominal ultrasound: Findings consistent with cholestasis, multiple gallstones in the gallbladder, gallbladder not distended, no pericholecystic fluid or wall thickening, common bile duct measures 5 mm (05/03/2024)    Assessment & Plan Cholelithiasis   Acute cholelithiasis presents with right upper quadrant pain radiating to the back, likely due to gallstone obstruction in the bile ducts. Ultrasound confirmed multiple gallstones without cholecystitis. Pain resolved with ER intervention. Discussed potential for recurrent episodes and recommended cholecystectomy to prevent future occurrences. Explained that gallbladder removal will not affect IBS, gastric ulcers, or reflux, but may alleviate symptoms if previously masked by gallbladder issues. Discussed laparoscopic cholecystectomy procedure, recovery expectations, and potential post-operative changes such as increased bowel movements or dumping  syndrome. Emphasized avoiding fried and greasy foods post-surgery to prevent complications. Will perform laparoscopic cholecystectomy. Advised avoiding fried and greasy foods post-surgery. Instructed to inform anesthesiologist about previous nausea with anesthesia for premedication.   Biliary colic [K80.50]          Patient verbalized understanding, all questions were answered, and were agreeable with the plan outlined above.   Lucas Sjogren, MD  Electronically signed by Lucas Sjogren, MD

## 2024-05-08 NOTE — Progress Notes (Signed)
 History of Present Illness Katherine Lester is a 68 year old female who presents with right upper quadrant abdominal pain.  She presented to the ER on May 03, 2034, with severe right upper quadrant abdominal pain radiating to her back. This was her first episode of such pain, described as 'hurting like mad'. The pain worsened when lying down and was severe enough to cause her to cry.  She has a history of acid reflux and gastric ulcers, for which she takes pantoprazole daily. During the episode, she took Pepcid AC and a pain pill, but these did not alleviate the pain. An ultrasound performed on the same day showed multiple gallstones in the gallbladder. The gallbladder was not distended, and there was no pericardial cystic fluid or wall thickening. The common bile duct measured five millimeters. A comprehensive metabolic panel showed a total bilirubin of 0.3.  She has a history of IBS, which has worsened recently, requiring increased use of Miralax. She also has a history of gastric ulcers, which were treated successfully with antibiotics in the past.      PAST MEDICAL HISTORY:  Past Medical History:  Diagnosis Date   Allergy 1999   Anesthesia complication    dry mouth   Anxiety 2018   Arrhythmia 09/24   Arthritis 2017   Chronic obstructive pulmonary disease (CMS/HHS-HCC) 04/12/2023   DDD (degenerative disc disease), lumbar    Depression 2018   GERD (gastroesophageal reflux disease)    History of cancer 2021   Skin   History of cataract    History of pneumonia    Dr Rudolpho   Hyperlipidemia    Hypertension 2024   Lumbar facet joint syndrome    Motion sickness    Obesity    PONV (postoperative nausea and vomiting)    Sacroiliac joint dysfunction    Sinusitis, unspecified    Sleep apnea         PAST SURGICAL HISTORY:   Past Surgical History:  Procedure Laterality Date   EXTRACTION CATARACT EXTRACAPSULAR W/INSERTION INTRAOCULAR PROSTHESIS Right  02/27/2020   Procedure: RIGHT EXTRACTION CATARACT EXTRACAPSULAR WITH PHACO WITH INSERTION INTRAOCULAR PROSTHESIS;  Surgeon: Thurman Therisa Cohn, MD;  Location: [facility];  Service: Ophthalmology;  Laterality: Right;   EXTRACTION CATARACT EXTRACAPSULAR W/INSERTION INTRAOCULAR PROSTHESIS Left 03/19/2020   Procedure: LEFT EXTRACTION CATARACT EXTRACAPSULAR WITH PHACO WITH INSERTION INTRAOCULAR PROSTHESIS;  Surgeon: Thurman Therisa Cohn, MD;  Location: [facility];  Service: Ophthalmology;  Laterality: Left;   APPENDECTOMY     CATARACT EXTRACTION  2021   COLONOSCOPY     EGD     FRACTURE SURGERY  1978   Left arm 2 surgeries   HYSTERECTOMY     Left Arm Fracture Repair and Nerve Repair 1978  1978 and 1979         MEDICATIONS:  Outpatient Encounter Medications as of 05/08/2024  Medication Sig Dispense Refill   azelastine (ASTELIN) 137 mcg nasal spray Place 1 spray into both nostrils 2 (two) times daily 30 mL 2   cyanocobalamin (VITAMIN B12) 1000 MCG tablet Take 1,000 mcg by mouth once daily     DULoxetine  (CYMBALTA ) 30 MG DR capsule Take 30 mg by mouth every evening     DULoxetine  (CYMBALTA ) 60 MG DR capsule Take 60 mg by mouth every morning        ergocalciferol, vitamin D2, 1,250 mcg (50,000 unit) capsule TAKE 1 CAPSULE BY MOUTH EVERY 7 DAYS 4 capsule 2   HYDROcodone -acetaminophen  (NORCO) 5-325 mg tablet 1/2-1 tab po bid  prn 50 tablet 0   levoFLOXacin (LEVAQUIN) 500 MG tablet Take 1 tablet (500 mg total) by mouth once daily for 10 days 10 tablet 0   LORazepam (ATIVAN) 1 MG tablet Take 1 mg by mouth once daily     metoprolol SUCCinate (TOPROL-XL) 25 MG XL tablet TAKE 1 TABLET(25 MG) BY MOUTH DAILY 90 tablet 1   multivitamin tablet Take 1 tablet by mouth once daily     OHTUVAYRE  3 mg/2.5 mL NbSp Inhale 2.5 mLs into the lungs 2 (two) times daily     ondansetron  (ZOFRAN -ODT) 4 MG disintegrating tablet Take 4 mg by mouth every 6 (six) hours as needed for Nausea or Vomiting      pantoprazole (PROTONIX) 40 MG DR tablet Take 1 tablet (40 mg total) by mouth every morning 90 tablet 1   polyethylene glycol (MIRALAX) packet Take 17 g by mouth once daily Mix in 4-8ounces of fluid prior to taking.     tiZANidine (ZANAFLEX) 4 MG tablet TAKE 1/2 TO 1 TABLET(2 TO 4 MG) BY MOUTH TWICE DAILY AS NEEDED 60 tablet 2   zolpidem (AMBIEN) 10 mg tablet Take 10 mg by mouth at bedtime     albuterol MDI, PROVENTIL, VENTOLIN, PROAIR, HFA 90 mcg/actuation inhaler INHALE 2 INHALATIONS INTO THE LUNGS EVERY 6 HOURS AS NEEDED FOR WHEEZING (Patient not taking: Reported on 05/08/2024) 18 g 0   busPIRone (BUSPAR) 15 MG tablet Take 30 mg by mouth 2 (two) times daily (Patient not taking: Reported on 05/08/2024)     hydroCHLOROthiazide (HYDRODIURIL) 12.5 MG tablet Take 1 tablet (12.5 mg total) by mouth once daily 30 tablet 11   HYDROcodone -acetaminophen  (NORCO) 5-325 mg tablet 1/2-1 tab po bid prn (Patient not taking: Reported on 05/08/2024) 50 tablet 0   HYDROcodone -homatropine (HYCODAN) 5-1.5 mg/5 mL syrup Take 5 mLs by mouth every 6 (six) hours as needed for Cough (Patient not taking: Reported on 05/08/2024) 120 mL 0   pantoprazole (PROTONIX) 40 MG DR tablet TAKE 1 TABLET(40 MG) BY MOUTH EVERY MORNING (Patient not taking: Reported on 03/19/2024) 90 tablet 1   rosuvastatin (CRESTOR) 40 MG tablet Take 1 tablet (40 mg total) by mouth once daily (Patient not taking: Reported on 04/03/2024) 30 tablet 11   No facility-administered encounter medications on file as of 05/08/2024.     ALLERGIES:   Amoxicillin-pot clavulanate, Diltiazem, Sudafed [pseudoephedrine hcl], Celexa [citalopram], Erythromycin base, Gabapentin , Macrobid [nitrofurantoin monohyd/m-cryst], Rosuvastatin, Codeine, Morphine, and Tramadol   SOCIAL HISTORY:  Social History   Socioeconomic History   Marital status: Married  Tobacco Use   Smoking status: Every Day    Current packs/day: 0.50    Average packs/day: 0.5 packs/day for  40.0 years (20.0 ttl pk-yrs)    Types: Cigarettes    Passive exposure: Current   Smokeless tobacco: Never  Vaping Use   Vaping status: Never Used  Substance and Sexual Activity   Alcohol use: No   Drug use: Never   Sexual activity: Not Currently    Birth control/protection: None   Social Drivers of Health   Financial Resource Strain: Low Risk  (05/08/2024)   Overall Financial Resource Strain (CARDIA)    Difficulty of Paying Living Expenses: Not hard at all  Food Insecurity: No Food Insecurity (05/08/2024)   Hunger Vital Sign    Worried About Running Out of Food in the Last Year: Never true    Ran Out of Food in the Last Year: Never true  Transportation Needs: No Transportation Needs (05/08/2024)  PRAPARE - Administrator, Civil Service (Medical): No    Lack of Transportation (Non-Medical): No    FAMILY HISTORY:  Family History  Problem Relation Name Age of Onset   Lung cancer Mother MOM    Cancer Mother MOM    Skin cancer Father Pop    Alzheimer's disease Paternal Grandmother Brownie    Osteoporosis (Thinning of bones) Paternal Aunt Nena    Osteoporosis (Thinning of bones) Paternal Aunt Judy    Osteoporosis (Thinning of bones) Paternal Aunt Nena    Osteoporosis (Thinning of bones) Paternal Aunt Judy    Glaucoma Neg Hx     Macular degeneration Neg Hx     Anesthesia problems Neg Hx       GENERAL REVIEW OF SYSTEMS:   General ROS: negative for - chills, fatigue, fever, weight gain or weight loss Allergy and Immunology ROS: negative for - hives  Hematological and Lymphatic ROS: negative for - bleeding problems or bruising, negative for palpable nodes Endocrine ROS: negative for - heat or cold intolerance, hair changes Respiratory ROS: negative for - cough, shortness of breath or wheezing Cardiovascular ROS: no chest pain or palpitations GI ROS: negative for nausea, vomiting, abdominal pain, diarrhea, constipation Musculoskeletal ROS:  negative for - joint swelling or muscle pain Neurological ROS: negative for - confusion, syncope Dermatological ROS: negative for pruritus and rash  PHYSICAL EXAM:  Vitals:   05/08/24 1116  BP: 127/84  Pulse: 84  .  Ht:165.1 cm (5' 5) Wt:92.1 kg (203 lb) ADJ:Anib surface area is 2.06 meters squared. Body mass index is 33.78 kg/m.SABRA   GENERAL: Alert, active, oriented x3  HEENT: Pupils equal reactive to light. Extraocular movements are intact. Sclera clear. Palpebral conjunctiva normal red color.Pharynx clear.  NECK: Supple with no palpable mass and no adenopathy.  LUNGS: Sound clear with no rales rhonchi or wheezes.  HEART: Regular rhythm S1 and S2 without murmur.  ABDOMEN: Soft and depressible, nontender with no palpable mass, no hepatomegaly.   EXTREMITIES: Well-developed well-nourished symmetrical with no dependent edema.  NEUROLOGICAL: Awake alert oriented, facial expression symmetrical, moving all extremities.   Results LABS CMP: Total bilirubin 0.3 mg/dL (87/95/7974)  RADIOLOGY Abdominal ultrasound: Findings consistent with cholestasis, multiple gallstones in the gallbladder, gallbladder not distended, no pericholecystic fluid or wall thickening, common bile duct measures 5 mm (05/03/2024)    Assessment & Plan Cholelithiasis   Acute cholelithiasis presents with right upper quadrant pain radiating to the back, likely due to gallstone obstruction in the bile ducts. Ultrasound confirmed multiple gallstones without cholecystitis. Pain resolved with ER intervention. Discussed potential for recurrent episodes and recommended cholecystectomy to prevent future occurrences. Explained that gallbladder removal will not affect IBS, gastric ulcers, or reflux, but may alleviate symptoms if previously masked by gallbladder issues. Discussed laparoscopic cholecystectomy procedure, recovery expectations, and potential post-operative changes such as increased bowel movements or dumping  syndrome. Emphasized avoiding fried and greasy foods post-surgery to prevent complications. Will perform laparoscopic cholecystectomy. Advised avoiding fried and greasy foods post-surgery. Instructed to inform anesthesiologist about previous nausea with anesthesia for premedication.   Biliary colic [K80.50]          Patient verbalized understanding, all questions were answered, and were agreeable with the plan outlined above.   Lucas Sjogren, MD  Electronically signed by Lucas Sjogren, MD

## 2024-05-10 ENCOUNTER — Other Ambulatory Visit: Payer: Self-pay

## 2024-05-10 ENCOUNTER — Inpatient Hospital Stay
Admission: RE | Admit: 2024-05-10 | Discharge: 2024-05-10 | Disposition: A | Source: Ambulatory Visit | Attending: General Surgery

## 2024-05-10 NOTE — Patient Instructions (Addendum)
 Your procedure is scheduled on: 05/11/24 - Friday Report to the Registration Desk on the 1st floor of the [facility]. To find out your arrival time, please call [phone] between 1PM - 3PM on: 05/10/24 - Thursday If your arrival time is 6:00 am, do not arrive before that time as the [facility] entrance doors do not open until 6:00 am.  REMEMBER: Instructions that are not followed completely may result in serious medical risk, up to and including death; or upon the discretion of your surgeon and anesthesiologist your surgery may need to be rescheduled.  Do not eat food or drink any liquids after midnight the night before surgery.  No gum chewing or hard candies.  One week prior to surgery: Stop Anti-inflammatories (NSAIDS) such as Advil, Aleve , Ibuprofen, Motrin, Naproxen , Naprosyn  and Aspirin based products such as Excedrin, Goody's Powder, BC Powder. You may take Tylenol  if needed for pain up until the day of surgery.  Stop ANY OVER THE COUNTER supplements until after surgery.  ON THE DAY OF SURGERY ONLY TAKE THESE MEDICATIONS WITH SIPS OF WATER:  DULoxetine  (CYMBALTA )  Ensifentrine  (OHTUVAYRE ) 3 MG/2.5ML SUSP  pantoprazole (PROTONIX)  HYDROcodone -acetaminophen  if needed   No Alcohol for 24 hours before or after surgery.  No Smoking including e-cigarettes for 24 hours before surgery.  No chewable tobacco products for at least 6 hours before surgery.  No nicotine patches on the day of surgery.  Do not use any recreational drugs for at least a week (preferably 2 weeks) before your surgery.  Please be advised that the combination of cocaine and anesthesia may have negative outcomes, up to and including death. If you test positive for cocaine, your surgery will be cancelled.  On the morning of surgery brush your teeth with toothpaste and water, you may rinse your mouth with mouthwash if you wish. Do not swallow any toothpaste or mouthwash.  Use CHG Soap or wipes as  directed on instruction sheet.  Do not wear jewelry, make-up, hairpins, clips or nail polish.  For welded (permanent) jewelry: bracelets, anklets, waist bands, etc.  Please have this removed prior to surgery.  If it is not removed, there is a chance that hospital personnel will need to cut it off on the day of surgery.  Do not wear lotions, powders, or perfumes.   Do not shave body hair from the neck down 48 hours before surgery.  Contact lenses, hearing aids and dentures may not be worn into surgery.  Do not bring valuables to the hospital. [facility] is not responsible for any missing/lost belongings or valuables.   Notify your doctor if there is any change in your medical condition (cold, fever, infection).  Wear comfortable clothing (specific to your surgery type) to the hospital.  After surgery, you can help prevent lung complications by doing breathing exercises.  Take deep breaths and cough every 1-2 hours. Your doctor may order a device called an Incentive Spirometer to help you take deep breaths.  When coughing or sneezing, hold a pillow firmly against your incision with both hands. This is called splinting. Doing this helps protect your incision. It also decreases belly discomfort.  If you are being admitted to the hospital overnight, leave your suitcase in the car. After surgery it may be brought to your room.  In case of increased patient census, it may be necessary for you, the patient, to continue your postoperative care in the Same Day Surgery department.  If you are being discharged the day  of surgery, you will not be allowed to drive home. You will need a responsible individual to drive you home and stay with you for 24 hours after surgery.   If you are taking public transportation, you will need to have a responsible individual with you.  Please call the Pre-admissions Testing Dept. at [phone] if you have any questions about these instructions.  Surgery  Visitation Policy:  Patients having surgery or a procedure may have two visitors.  Children under the age of 78 must have an adult with them who is not the patient.  Inpatient Visitation:    Visiting hours are 7 a.m. to 8 p.m. Up to four visitors are allowed at one time in a patient room. The visitors may rotate out with other people during the day.  One visitor age 91 or older may stay with the patient overnight and must be in the room by 8 p.m.   Merchandiser, Retail to address health-related social needs:  [email or web address]                                                                                                             Preparing for Surgery with CHLORHEXIDINE GLUCONATE (CHG) Soap  Chlorhexidine Gluconate (CHG) Soap  o An antiseptic cleaner that kills germs and bonds with the skin to continue killing germs even after washing  o Used for showering the night before surgery and morning of surgery  Before surgery, you can play an important role by reducing the number of germs on your skin.  CHG (Chlorhexidine gluconate) soap is an antiseptic cleanser which kills germs and bonds with the skin to continue killing germs even after washing.  Please do not use if you have an allergy to CHG or antibacterial soaps. If your skin becomes reddened/irritated stop using the CHG.  1. Shower the NIGHT BEFORE SURGERY with CHG soap.  2. If you choose to wash your hair, wash your hair first as usual with your normal shampoo.  3. After shampooing, rinse your hair and body thoroughly to remove the shampoo.  4. Use CHG as you would any other liquid soap. You can apply CHG directly to the skin and wash gently with a clean washcloth.  5. Apply the CHG soap to your body only from the neck down. Do not use on open wounds or open sores. Avoid contact with your eyes, ears, mouth, and genitals (private parts). Wash face and genitals (private parts) with your normal  soap.  6. Wash thoroughly, paying special attention to the area where your surgery will be performed.  7. Thoroughly rinse your body with warm water.  8. Do not shower/wash with your normal soap after using and rinsing off the CHG soap.  9. Do not use lotions, oils, etc., after showering with CHG.  10. Pat yourself dry with a clean towel.  11. Wear clean pajamas to bed the night before surgery.  12. Place clean sheets on your bed the night of your shower and do not sleep with pets.  13.  Do not apply any deodorants/lotions/powders.  14. Please wear clean clothes to the hospital.  15. Remember to brush your teeth with your regular toothpaste.

## 2024-05-11 ENCOUNTER — Ambulatory Visit
Admission: RE | Admit: 2024-05-11 | Discharge: 2024-05-11 | Disposition: A | Discharge status: Home / Self Care | Attending: General Surgery | Admitting: General Surgery

## 2024-05-11 ENCOUNTER — Encounter: Payer: Self-pay | Admitting: General Surgery

## 2024-05-11 ENCOUNTER — Ambulatory Visit: Admitting: Anesthesiology

## 2024-05-11 ENCOUNTER — Other Ambulatory Visit: Payer: Self-pay

## 2024-05-11 ENCOUNTER — Encounter: Admission: RE | Disposition: A | Payer: Self-pay | Source: Home / Self Care | Attending: General Surgery

## 2024-05-11 DIAGNOSIS — G4730 Sleep apnea, unspecified: Secondary | ICD-10-CM | POA: Insufficient documentation

## 2024-05-11 DIAGNOSIS — J449 Chronic obstructive pulmonary disease, unspecified: Secondary | ICD-10-CM | POA: Insufficient documentation

## 2024-05-11 DIAGNOSIS — F17210 Nicotine dependence, cigarettes, uncomplicated: Secondary | ICD-10-CM | POA: Insufficient documentation

## 2024-05-11 DIAGNOSIS — K219 Gastro-esophageal reflux disease without esophagitis: Secondary | ICD-10-CM | POA: Insufficient documentation

## 2024-05-11 DIAGNOSIS — I10 Essential (primary) hypertension: Secondary | ICD-10-CM | POA: Insufficient documentation

## 2024-05-11 DIAGNOSIS — K8064 Calculus of gallbladder and bile duct with chronic cholecystitis without obstruction: Secondary | ICD-10-CM | POA: Insufficient documentation

## 2024-05-11 SURGERY — CHOLECYSTECTOMY, ROBOT-ASSISTED, LAPAROSCOPIC
Anesthesia: General | Site: Abdomen

## 2024-05-11 MED ORDER — ONDANSETRON HCL 4 MG/2ML IJ SOLN
4.0000 mg | Freq: Once | INTRAMUSCULAR | Status: AC | PRN
  Administered 2024-05-11: 4 mg via INTRAVENOUS

## 2024-05-11 MED ORDER — FENTANYL CITRATE (PF) 100 MCG/2ML IJ SOLN
INTRAMUSCULAR | Status: AC
  Filled 2024-05-11: qty 2

## 2024-05-11 MED ORDER — FENTANYL CITRATE (PF) 100 MCG/2ML IJ SOLN
INTRAMUSCULAR | Status: DC | PRN
  Administered 2024-05-11 (×2): 50 ug via INTRAVENOUS

## 2024-05-11 MED ORDER — CHLORHEXIDINE GLUCONATE 0.12 % MT SOLN
OROMUCOSAL | Status: AC
  Filled 2024-05-11: qty 15

## 2024-05-11 MED ORDER — DEXMEDETOMIDINE HCL IN NACL 80 MCG/20ML IV SOLN
INTRAVENOUS | Status: AC
  Filled 2024-05-11: qty 20

## 2024-05-11 MED ORDER — SCOPOLAMINE 1 MG/3DAYS TD PT72
1.0000 | MEDICATED_PATCH | TRANSDERMAL | Status: DC
  Administered 2024-05-11: 1 mg via TRANSDERMAL

## 2024-05-11 MED ORDER — CHLORHEXIDINE GLUCONATE 0.12 % MT SOLN
15.0000 mL | Freq: Once | OROMUCOSAL | Status: AC
  Administered 2024-05-11: 15 mL via OROMUCOSAL

## 2024-05-11 MED ORDER — LACTATED RINGERS IV SOLN: INTRAVENOUS | Status: DC

## 2024-05-11 MED ORDER — PROPOFOL 1000 MG/100ML IV EMUL
INTRAVENOUS | Status: AC
  Filled 2024-05-11: qty 100

## 2024-05-11 MED ORDER — ONDANSETRON HCL 4 MG/2ML IJ SOLN
INTRAMUSCULAR | Status: AC
  Filled 2024-05-11: qty 2

## 2024-05-11 MED ORDER — BUPIVACAINE-EPINEPHRINE 0.25% -1:200000 IJ SOLN
INTRAMUSCULAR | Status: DC | PRN
  Administered 2024-05-11: 10 mL
  Administered 2024-05-11: 20 mL

## 2024-05-11 MED ORDER — MIDAZOLAM HCL 2 MG/2ML IJ SOLN
INTRAMUSCULAR | Status: AC
  Filled 2024-05-11: qty 2

## 2024-05-11 MED ORDER — SCOPOLAMINE 1 MG/3DAYS TD PT72
MEDICATED_PATCH | TRANSDERMAL | Status: AC
  Filled 2024-05-11: qty 1

## 2024-05-11 MED ORDER — FENTANYL CITRATE (PF) 100 MCG/2ML IJ SOLN
25.0000 ug | INTRAMUSCULAR | Status: DC | PRN
  Administered 2024-05-11 (×3): 25 ug via INTRAVENOUS

## 2024-05-11 MED ORDER — OXYCODONE HCL 5 MG/5ML PO SOLN: 5.0000 mg | Freq: Once | ORAL | Status: AC | PRN

## 2024-05-11 MED ORDER — PROPOFOL 10 MG/ML IV BOLUS
INTRAVENOUS | Status: AC
  Filled 2024-05-11: qty 20

## 2024-05-11 MED ORDER — CEFAZOLIN SODIUM-DEXTROSE 2-4 GM/100ML-% IV SOLN
INTRAVENOUS | Status: AC
  Filled 2024-05-11: qty 100

## 2024-05-11 MED ORDER — BUPIVACAINE-EPINEPHRINE (PF) 0.25% -1:200000 IJ SOLN
INTRAMUSCULAR | Status: AC
  Filled 2024-05-11: qty 30

## 2024-05-11 MED ORDER — 0.9 % SODIUM CHLORIDE (POUR BTL) OPTIME
TOPICAL | Status: DC | PRN
  Administered 2024-05-11: 500 mL

## 2024-05-11 MED ORDER — DROPERIDOL 2.5 MG/ML IJ SOLN
0.6250 mg | Freq: Once | INTRAMUSCULAR | Status: AC
  Administered 2024-05-11: 0.625 mg via INTRAVENOUS

## 2024-05-11 MED ORDER — ROCURONIUM BROMIDE 10 MG/ML (PF) SYRINGE
PREFILLED_SYRINGE | INTRAVENOUS | Status: AC
  Filled 2024-05-11: qty 10

## 2024-05-11 MED ORDER — ACETAMINOPHEN 10 MG/ML IV SOLN: 1000.0000 mg | Freq: Once | INTRAVENOUS | Status: DC | PRN

## 2024-05-11 MED ORDER — LIDOCAINE HCL (PF) 2 % IJ SOLN
INTRAMUSCULAR | Status: AC
  Filled 2024-05-11: qty 5

## 2024-05-11 MED ORDER — OXYCODONE HCL 5 MG PO TABS
5.0000 mg | ORAL_TABLET | Freq: Once | ORAL | Status: AC | PRN
  Administered 2024-05-11: 5 mg via ORAL

## 2024-05-11 MED ORDER — OXYCODONE HCL 5 MG PO TABS
ORAL_TABLET | ORAL | Status: AC
  Filled 2024-05-11: qty 1

## 2024-05-11 MED ORDER — ORAL CARE MOUTH RINSE: 15.0000 mL | Freq: Once | OROMUCOSAL | Status: AC

## 2024-05-11 MED ORDER — DEXMEDETOMIDINE HCL IN NACL 80 MCG/20ML IV SOLN
INTRAVENOUS | Status: DC | PRN
  Administered 2024-05-11 (×2): 8 ug via INTRAVENOUS

## 2024-05-11 MED ORDER — INDOCYANINE GREEN 25 MG IJ SOLR
1.2500 mg | Freq: Once | INTRAMUSCULAR | Status: AC
  Administered 2024-05-11: 1.25 mg via INTRAVENOUS

## 2024-05-11 MED ORDER — DROPERIDOL 2.5 MG/ML IJ SOLN
INTRAMUSCULAR | Status: AC
  Filled 2024-05-11: qty 2

## 2024-05-11 MED ORDER — CEFAZOLIN SODIUM-DEXTROSE 2-4 GM/100ML-% IV SOLN
2.0000 g | INTRAVENOUS | Status: AC
  Administered 2024-05-11: 2 g via INTRAVENOUS

## 2024-05-11 MED ORDER — HYDROCODONE-ACETAMINOPHEN 5-325 MG PO TABS
1.0000 | ORAL_TABLET | Freq: Four times a day (QID) | ORAL | 0 refills | Status: AC | PRN
  Filled 2024-05-11: qty 12, 3d supply, fill #0

## 2024-05-11 MED ADMIN — Ondansetron HCl Inj 4 MG/2ML (2 MG/ML): 4 mg | INTRAVENOUS | NDC 00409475518

## 2024-05-11 MED ADMIN — PROPOFOL 200 MG/20ML IV EMUL: 160 mg | INTRAVENOUS | NDC 00069020901

## 2024-05-11 MED ADMIN — Dexamethasone Sod Phosphate Preservative Free Inj 10 MG/ML: 10 mg | INTRAVENOUS | NDC 00641036721

## 2024-05-11 MED ADMIN — Sugammadex Sodium IV 200 MG/2ML (Base Equivalent): 100 mg | INTRAVENOUS | NDC 99999070036

## 2024-05-11 MED ADMIN — Acetaminophen IV Soln 10 MG/ML: 1000 mg | INTRAVENOUS | NDC 67457094010

## 2024-05-11 MED ADMIN — Rocuronium Bromide IV Soln 100 MG/10ML (10 MG/ML): 20 mg | INTRAVENOUS | NDC 99999070048

## 2024-05-11 MED ADMIN — Lidocaine HCl(Cardiac) IV PF Soln Pref Syr 100 MG/5ML (2%): 50 mg | INTRAVENOUS | NDC 55150016505

## 2024-05-11 MED ADMIN — PROPOFOL 200 MG/20ML IV EMUL: 150 ug/kg/min | INTRAVENOUS | NDC 00069024801

## 2024-05-11 MED ADMIN — Sugammadex Sodium IV 200 MG/2ML (Base Equivalent): 200 mg | INTRAVENOUS | NDC 99999070036

## 2024-05-11 MED ADMIN — Rocuronium Bromide IV Soln 100 MG/10ML (10 MG/ML): 50 mg | INTRAVENOUS | NDC 99999070048

## 2024-05-11 MED ADMIN — Midazolam HCl Inj PF 2 MG/2ML (Base Equivalent): 2 mg | INTRAVENOUS | NDC 00409000101

## 2024-05-11 MED FILL — Indocyanine Green For Inj Soln 25 MG: INTRAMUSCULAR | Qty: 10 | Status: AC

## 2024-05-11 MED FILL — Acetaminophen IV Soln 10 MG/ML: INTRAVENOUS | Qty: 100 | Status: AC

## 2024-05-11 SURGICAL SUPPLY — 39 items
BAG PRESSURE INF REUSE 1000 (BAG) IMPLANT
CANNULA REDUCER 12-8 DVNC XI (CANNULA) ×1 IMPLANT
CAUTERY HOOK MNPLR 1.6 DVNC XI (INSTRUMENTS) ×1 IMPLANT
CLIP LIGATING HEM O LOK PURPLE (MISCELLANEOUS) IMPLANT
CLIP LIGATING HEMO O LOK GREEN (MISCELLANEOUS) ×1 IMPLANT
DEFOGGER SCOPE WARM SEASHARP (MISCELLANEOUS) ×1 IMPLANT
DERMABOND ADVANCED .7 DNX12 (GAUZE/BANDAGES/DRESSINGS) ×1 IMPLANT
DRAPE ARM DVNC X/XI (DISPOSABLE) ×4 IMPLANT
DRAPE C-ARM XRAY 36X54 (DRAPES) IMPLANT
DRAPE COLUMN DVNC XI (DISPOSABLE) ×1 IMPLANT
ELECTRODE REM PT RTRN 9FT ADLT (ELECTROSURGICAL) ×1 IMPLANT
FORCEPS BPLR 8 MD DVNC XI (FORCEP) IMPLANT
FORCEPS BPLR FENES DVNC XI (FORCEP) ×1 IMPLANT
FORCEPS PROGRASP DVNC XI (FORCEP) ×1 IMPLANT
GLOVE BIO SURGEON STRL SZ 6.5 (GLOVE) ×2 IMPLANT
GLOVE BIOGEL PI IND STRL 6.5 (GLOVE) ×2 IMPLANT
GLOVE SURG SYN 6.5 PF PI (GLOVE) ×2 IMPLANT
GOWN STRL REUS W/ TWL LRG LVL3 (GOWN DISPOSABLE) ×4 IMPLANT
GRASPER SUT TROCAR 14GX15 (MISCELLANEOUS) ×1 IMPLANT
IRRIGATOR SUCT 8 DISP DVNC XI (IRRIGATION / IRRIGATOR) IMPLANT
IV 0.9% NACL 1000 ML (IV SOLUTION) IMPLANT
IV CATH ANGIO 12GX3 LT BLUE (NEEDLE) IMPLANT
KIT PINK PAD W/HEAD ARM REST (MISCELLANEOUS) ×1 IMPLANT
LABEL OR SOLS (LABEL) ×1 IMPLANT
MANIFOLD NEPTUNE II (INSTRUMENTS) IMPLANT
NDL HYPO 22X1.5 SAFETY MO (MISCELLANEOUS) ×1 IMPLANT
NDL INSUFFLATION 14GA 120MM (NEEDLE) ×1 IMPLANT
NS IRRIG 500ML POUR BTL (IV SOLUTION) ×1 IMPLANT
OBTURATOR OPTICALSTD 8 DVNC (TROCAR) ×1 IMPLANT
PACK LAP CHOLECYSTECTOMY (MISCELLANEOUS) ×1 IMPLANT
SEAL UNIV 5-12 XI (MISCELLANEOUS) ×4 IMPLANT
SET TUBE SMOKE EVAC HIGH FLOW (TUBING) ×1 IMPLANT
SOLN STERILE WATER 500 ML (IV SOLUTION) ×1 IMPLANT
SOLUTION ELECTROSURG ANTI STCK (MISCELLANEOUS) ×1 IMPLANT
SPIKE FLUID TRANSFER (MISCELLANEOUS) ×2 IMPLANT
SPONGE T-LAP 4X18 ~~LOC~~+RFID (SPONGE) IMPLANT
SUT VICRYL 0 UR6 27IN ABS (SUTURE) ×1 IMPLANT
SUTURE MNCRL 4-0 27XMF (SUTURE) ×1 IMPLANT
SYSTEM BAG RETRIEVAL 10MM (BASKET) ×1 IMPLANT

## 2024-05-11 NOTE — Anesthesia Preprocedure Evaluation (Addendum)
 Anesthesia Evaluation  Patient identified by MRN, date of birth, ID band Patient awake    Reviewed: Allergy & Precautions, NPO status , Patient's Chart, lab work & pertinent test results  History of Anesthesia Complications (+) PONV and history of anesthetic complications  Airway Mallampati: III   Neck ROM: Full    Dental  (+) Missing   Pulmonary sleep apnea , COPD, Current Smoker (1.5 ppd) and Patient abstained from smoking.   Pulmonary exam normal breath sounds clear to auscultation       Cardiovascular hypertension, Normal cardiovascular exam Rhythm:Regular Rate:Normal  ECG 05/03/24: NSR; ST and T abnormality, no STEMI  Echo 01/10/23:  Normal Stress Echocardiogram  NORMAL RIGHT VENTRICULAR SYSTOLIC FUNCTION  TRIVIAL REGURGITATION NOTED   NO VALVULAR STENOSIS NOTED     Neuro/Psych  PSYCHIATRIC DISORDERS Anxiety Depression    Vertigo; chronic pain    GI/Hepatic ,GERD  ,,  Endo/Other  negative endocrine ROS    Renal/GU negative Renal ROS     Musculoskeletal  (+) Arthritis ,    Abdominal   Peds  Hematology negative hematology ROS (+)   Anesthesia Other Findings   Reproductive/Obstetrics                              Anesthesia Physical Anesthesia Plan  ASA: 2  Anesthesia Plan: General   Post-op Pain Management:    Induction: Intravenous  PONV Risk Score and Plan: 3 and Ondansetron , Dexamethasone , Treatment may vary due to age or medical condition, Propofol infusion, TIVA and Scopolamine patch - Pre-op  Airway Management Planned: Oral ETT  Additional Equipment:   Intra-op Plan:   Post-operative Plan: Extubation in OR  Informed Consent: I have reviewed the patients History and Physical, chart, labs and discussed the procedure including the risks, benefits and alternatives for the proposed anesthesia with the patient or authorized representative who has indicated his/her  understanding and acceptance.     Dental advisory given  Plan Discussed with: CRNA  Anesthesia Plan Comments: (Patient consented for risks of anesthesia including but not limited to:  - adverse reactions to medications - damage to eyes, teeth, lips or other oral mucosa - nerve damage due to positioning  - sore throat or hoarseness - damage to heart, brain, nerves, lungs, other parts of body or loss of life  Informed patient about role of CRNA in peri- and intra-operative care.  Patient voiced understanding.)         Anesthesia Quick Evaluation

## 2024-05-11 NOTE — Anesthesia Procedure Notes (Signed)
 Procedure Name: Intubation Date/Time: 05/11/2024 9:19 AM  Performed by: Lorrene Camelia LABOR, CRNAPre-anesthesia Checklist: Patient identified, Patient being monitored, Emergency Drugs available and Suction available Patient Re-evaluated:Patient Re-evaluated prior to induction Oxygen Delivery Method: Circle system utilized Preoxygenation: Pre-oxygenation with 100% oxygen Induction Type: IV induction Ventilation: Mask ventilation without difficulty Laryngoscope Size: 3 and McGrath Grade View: Grade I Tube type: Oral Tube size: 7.0 mm Number of attempts: 1 Airway Equipment and Method: Stylet Placement Confirmation: ETT inserted through vocal cords under direct vision, positive ETCO2 and breath sounds checked- equal and bilateral Secured at: 21 cm Tube secured with: Tape Dental Injury: Teeth and Oropharynx as per pre-operative assessment

## 2024-05-11 NOTE — Anesthesia Postprocedure Evaluation (Signed)
 Anesthesia Post Note  Patient: Katherine Lester  Procedure(s) Performed: CHOLECYSTECTOMY, ROBOT-ASSISTED, LAPAROSCOPIC (Abdomen)  Patient location during evaluation: PACU Anesthesia Type: General Level of consciousness: awake and alert, oriented and patient cooperative Pain management: pain level controlled Vital Signs Assessment: post-procedure vital signs reviewed and stable Respiratory status: spontaneous breathing, nonlabored ventilation and respiratory function stable Cardiovascular status: blood pressure returned to baseline and stable Postop Assessment: adequate PO intake Anesthetic complications: no   No notable events documented.   Last Vitals:  Vitals:   05/11/24 1054 05/11/24 1105  BP:    Pulse: 70 67  Resp: (!) 24 (!) 22  Temp:    SpO2: 97% 98%    Last Pain:  Vitals:   05/11/24 1118  TempSrc:   PainSc: 9                  Alfonso Ruths

## 2024-05-11 NOTE — Interval H&P Note (Signed)
 History and Physical Interval Note:  05/11/2024 8:51 AM  Katherine Lester [place]  has presented today for surgery, with the diagnosis of K80.20 cholelithiasis w/o cholecystitis.  The various methods of treatment have been discussed with the patient and family. After consideration of risks, benefits and other options for treatment, the patient has consented to  Procedures: CHOLECYSTECTOMY, ROBOT-ASSISTED, LAPAROSCOPIC (N/A) as a surgical intervention.  The patient's history has been reviewed, patient examined, no change in status, stable for surgery.  I have reviewed the patient's chart and labs.  Questions were answered to the patient's satisfaction.     Lucas Sjogren

## 2024-05-11 NOTE — Discharge Instructions (Signed)

## 2024-05-11 NOTE — Op Note (Signed)
 Preoperative diagnosis: Biliary colic  Postoperative diagnosis: Same  Procedure: Robotic Assisted Laparoscopic Cholecystectomy.   Anesthesia: GETA   Surgeon: Dr. Cesar Coe  Wound Classification: Clean Contaminated  Indications: Patient is a 68 y.o. female developed right upper quadrant pain, nausea and on workup was found to have cholelithiasis. Robotic Assisted Laparoscopic cholecystectomy was elected.  Findings: Abundant adhesion of omentum to abdominal wall Critical view of safety achieved Cystic duct and artery identified, ligated and divided Adequate hemostasis         Description of procedure: The patient was placed on the operating table in the supine position. General anesthesia was induced. A time-out was completed verifying correct patient, procedure, site, positioning, and implant(s) and/or special equipment prior to beginning this procedure. An orogastric tube was placed. The abdomen was prepped and draped in the usual sterile fashion.  An incision was made in a natural skin line below the umbilicus.  The fascia was elevated and the Veress needle inserted. Proper position was confirmed by aspiration and saline meniscus test.  The abdomen was insufflated with carbon dioxide to a pressure of 15 mmHg. The patient tolerated insufflation well. A 8-mm trocar was then inserted in optiview fashion.  The laparoscope was inserted and the abdomen inspected. No injuries from initial trocar placement were noted. Additional trocars were then inserted in the following locations: an 8-mm trocar in the left lateral abdomen, and another two 8-mm trocars to the right side of the abdomen 5 cm appart. The umbilical trocar was changed to a 12 mm trocar all under direct visualization. The abdomen was inspected and no abnormalities were found. The table was placed in the reverse Trendelenburg position with the right side up. The robotic arms were docked and target anatomy identified. Instrument  inserted under direct visualization.  Abundant dense adhesion from omentum to abdominal wall were identified. Difficult and time consuming adhesions was needed to be done. The dome of the gallbladder was grasped with a prograsp and retracted over the dome of the liver. The infundibulum was also grasped with an atraumatic grasper and retracted toward the right lower quadrant. This maneuver exposed Calots triangle. The peritoneum overlying the gallbladder infundibulum was then incised and the cystic duct and cystic artery identified and circumferentially dissected. Critical view of safety reviewed before ligating any structure. Firefly images taken to visualize biliary ducts. The cystic duct and cystic artery were then doubly clipped and divided close to the gallbladder.  The gallbladder was then dissected from its peritoneal attachments by electrocautery. Hemostasis was checked and the gallbladder and contained stones were removed using an endoscopic retrieval bag. The gallbladder was passed off the table as a specimen. The gallbladder fossa was copiously irrigated with saline and hemostasis was obtained. There was no evidence of bleeding from the gallbladder fossa or cystic artery or leakage of the bile from the cystic duct stump. Secondary trocars were removed under direct vision. No bleeding was noted. The robotic arms were undoked. The scope was withdrawn and the umbilical trocar removed. The abdomen was allowed to collapse. The fascia of the 12mm trocar sites was closed with figure-of-eight 0 vicryl sutures. The skin was closed with subcuticular sutures of 4-0 monocryl and topical skin adhesive. The orogastric tube was removed.  The patient tolerated the procedure well and was taken to the postanesthesia care unit in stable condition.   Specimen: Gallbladder  Complications: None  EBL: 5 mL

## 2024-05-11 NOTE — Transfer of Care (Signed)
 Immediate Anesthesia Transfer of Care Note  Patient: Katherine Lester  Procedure(s) Performed: CHOLECYSTECTOMY, ROBOT-ASSISTED, LAPAROSCOPIC (Abdomen)  Patient Location: PACU  Anesthesia Type:General  Level of Consciousness: drowsy and patient cooperative  Airway & Oxygen Therapy: Patient Spontanous Breathing and Patient connected to nasal cannula oxygen  Post-op Assessment: Report given to RN and Post -op Vital signs reviewed and stable  Post vital signs: Reviewed and stable  Last Vitals:  Vitals Value Taken Time  BP    Temp 36.7 C 05/11/24 10:46  Pulse 70 05/11/24 10:46  Resp 22 05/11/24 10:46  SpO2 97 % 05/11/24 10:46    Last Pain:  Vitals:   05/11/24 1046  TempSrc: Tympanic  PainSc:          Complications: No notable events documented.

## 2024-05-14 LAB — SURGICAL PATHOLOGY
